# Patient Record
Sex: Female | Born: 1945
Health system: Southern US, Community
[De-identification: ages and names within clinical notes are randomized; demographics above are authoritative.]

## PROBLEM LIST (undated history)

## (undated) ENCOUNTER — Emergency Department (HOSPITAL_COMMUNITY): Discharge status: Against Medical Advice | Payer: BC Managed Care – PPO

## (undated) DIAGNOSIS — M542 Cervicalgia: Secondary | ICD-10-CM

## (undated) DIAGNOSIS — C801 Malignant (primary) neoplasm, unspecified: Secondary | ICD-10-CM

## (undated) DIAGNOSIS — N329 Bladder disorder, unspecified: Secondary | ICD-10-CM

## (undated) DIAGNOSIS — Z7901 Long term (current) use of anticoagulants: Secondary | ICD-10-CM

## (undated) DIAGNOSIS — Z8543 Personal history of malignant neoplasm of ovary: Secondary | ICD-10-CM

## (undated) DIAGNOSIS — R112 Nausea with vomiting, unspecified: Secondary | ICD-10-CM

## (undated) DIAGNOSIS — Z87442 Personal history of urinary calculi: Secondary | ICD-10-CM

## (undated) DIAGNOSIS — R319 Hematuria, unspecified: Secondary | ICD-10-CM

## (undated) DIAGNOSIS — R635 Abnormal weight gain: Secondary | ICD-10-CM

## (undated) DIAGNOSIS — Z8781 Personal history of (healed) traumatic fracture: Secondary | ICD-10-CM

## (undated) DIAGNOSIS — Z9889 Other specified postprocedural states: Secondary | ICD-10-CM

## (undated) DIAGNOSIS — R51 Headache: Secondary | ICD-10-CM

## (undated) DIAGNOSIS — S6390XA Sprain of unspecified part of unspecified wrist and hand, initial encounter: Secondary | ICD-10-CM

## (undated) DIAGNOSIS — Z86718 Personal history of other venous thrombosis and embolism: Secondary | ICD-10-CM

## (undated) DIAGNOSIS — J069 Acute upper respiratory infection, unspecified: Secondary | ICD-10-CM

## (undated) DIAGNOSIS — M1990 Unspecified osteoarthritis, unspecified site: Secondary | ICD-10-CM

## (undated) HISTORY — PX: CATARACT EXTRACTION W/ INTRAOCULAR LENS  IMPLANT, BILATERAL: SHX1307

## (undated) HISTORY — PX: BLEPHAROPLASTY: SUR158

## (undated) HISTORY — DX: Personal history of (healed) traumatic fracture: Z87.81

## (undated) HISTORY — DX: Cervicalgia: M54.2

## (undated) HISTORY — PX: ABDOMINAL HYSTERECTOMY: SHX81

## (undated) HISTORY — PX: APPENDECTOMY: SHX54

## (undated) HISTORY — DX: Malignant (primary) neoplasm, unspecified: C80.1

## (undated) HISTORY — PX: LAPAROSCOPIC CHOLECYSTECTOMY: SUR755

## (undated) HISTORY — DX: Sprain of unspecified part of unspecified wrist and hand, initial encounter: S63.90XA

## (undated) HISTORY — DX: Headache: R51

## (undated) HISTORY — PX: VENTRAL HERNIA REPAIR: SHX424

## (undated) HISTORY — PX: TRANSURETHRAL RESECTION OF BLADDER: SUR1395

## (undated) HISTORY — DX: Acute upper respiratory infection, unspecified: J06.9

## (undated) HISTORY — DX: Personal history of malignant neoplasm of ovary: Z85.43

## (undated) HISTORY — DX: Other cardiomyopathies: I42.8

## (undated) HISTORY — PX: INGUINAL HERNIA REPAIR: SUR1180

## (undated) HISTORY — DX: Abnormal weight gain: R63.5

---

## 1977-03-28 HISTORY — PX: GASTRIC BYPASS: SHX52

## 1999-04-12 ENCOUNTER — Other Ambulatory Visit: Admission: RE | Admit: 1999-04-12 | Discharge: 1999-04-12 | Payer: Self-pay | Admitting: *Deleted

## 2000-04-17 ENCOUNTER — Encounter: Admission: RE | Admit: 2000-04-17 | Discharge: 2000-04-17 | Payer: Self-pay | Admitting: *Deleted

## 2000-04-17 ENCOUNTER — Encounter: Payer: Self-pay | Admitting: *Deleted

## 2000-04-17 ENCOUNTER — Other Ambulatory Visit: Admission: RE | Admit: 2000-04-17 | Discharge: 2000-04-17 | Payer: Self-pay | Admitting: *Deleted

## 2002-03-26 ENCOUNTER — Encounter: Payer: Self-pay | Admitting: *Deleted

## 2002-03-26 ENCOUNTER — Encounter: Admission: RE | Admit: 2002-03-26 | Discharge: 2002-03-26 | Payer: Self-pay | Admitting: *Deleted

## 2002-05-14 ENCOUNTER — Other Ambulatory Visit: Admission: RE | Admit: 2002-05-14 | Discharge: 2002-05-14 | Payer: Self-pay | Admitting: Obstetrics & Gynecology

## 2002-07-04 ENCOUNTER — Ambulatory Visit (HOSPITAL_BASED_OUTPATIENT_CLINIC_OR_DEPARTMENT_OTHER): Admission: RE | Admit: 2002-07-04 | Discharge: 2002-07-04 | Payer: Self-pay | Admitting: Otolaryngology

## 2003-02-03 ENCOUNTER — Encounter: Admission: RE | Admit: 2003-02-03 | Discharge: 2003-05-04 | Payer: Self-pay

## 2003-04-14 ENCOUNTER — Encounter: Admission: RE | Admit: 2003-04-14 | Discharge: 2003-04-14 | Payer: Self-pay | Admitting: *Deleted

## 2004-07-12 ENCOUNTER — Encounter: Admission: RE | Admit: 2004-07-12 | Discharge: 2004-07-12 | Payer: Self-pay | Admitting: Obstetrics & Gynecology

## 2005-02-18 ENCOUNTER — Encounter: Admission: RE | Admit: 2005-02-18 | Discharge: 2005-02-18 | Payer: Self-pay | Admitting: General Surgery

## 2005-03-28 DIAGNOSIS — Z87440 Personal history of urinary (tract) infections: Secondary | ICD-10-CM

## 2005-03-28 DIAGNOSIS — C801 Malignant (primary) neoplasm, unspecified: Secondary | ICD-10-CM

## 2005-03-28 HISTORY — DX: Personal history of urinary (tract) infections: Z87.440

## 2005-03-28 HISTORY — PX: TRANSURETHRAL RESECTION OF BLADDER: SUR1395

## 2005-03-28 HISTORY — DX: Malignant (primary) neoplasm, unspecified: C80.1

## 2005-03-28 HISTORY — PX: OTHER SURGICAL HISTORY: SHX169

## 2005-03-28 HISTORY — PX: SPLENECTOMY: SUR1306

## 2005-04-04 ENCOUNTER — Encounter (INDEPENDENT_AMBULATORY_CARE_PROVIDER_SITE_OTHER): Payer: Self-pay | Admitting: *Deleted

## 2005-04-05 ENCOUNTER — Inpatient Hospital Stay (HOSPITAL_COMMUNITY): Admission: RE | Admit: 2005-04-05 | Discharge: 2005-04-06 | Payer: Self-pay | Admitting: General Surgery

## 2005-04-15 ENCOUNTER — Ambulatory Visit (HOSPITAL_COMMUNITY): Admission: RE | Admit: 2005-04-15 | Discharge: 2005-04-15 | Payer: Self-pay | Admitting: General Surgery

## 2005-04-26 ENCOUNTER — Ambulatory Visit: Admission: RE | Admit: 2005-04-26 | Discharge: 2005-04-26 | Payer: Self-pay | Admitting: Gynecology

## 2005-04-27 ENCOUNTER — Ambulatory Visit: Payer: Self-pay | Admitting: Oncology

## 2005-05-03 ENCOUNTER — Encounter (INDEPENDENT_AMBULATORY_CARE_PROVIDER_SITE_OTHER): Payer: Self-pay | Admitting: Specialist

## 2005-05-03 ENCOUNTER — Inpatient Hospital Stay (HOSPITAL_COMMUNITY): Admission: RE | Admit: 2005-05-03 | Discharge: 2005-05-07 | Payer: Self-pay | Admitting: Gynecologic Oncology

## 2005-05-03 DIAGNOSIS — Z9081 Acquired absence of spleen: Secondary | ICD-10-CM

## 2005-05-03 HISTORY — DX: Acquired absence of spleen: Z90.81

## 2005-05-03 HISTORY — PX: OTHER SURGICAL HISTORY: SHX169

## 2005-05-18 ENCOUNTER — Ambulatory Visit: Admission: RE | Admit: 2005-05-18 | Discharge: 2005-05-18 | Payer: Self-pay | Admitting: Gynecologic Oncology

## 2005-06-06 ENCOUNTER — Ambulatory Visit (HOSPITAL_BASED_OUTPATIENT_CLINIC_OR_DEPARTMENT_OTHER): Admission: RE | Admit: 2005-06-06 | Discharge: 2005-06-06 | Payer: Self-pay | Admitting: General Surgery

## 2005-06-16 ENCOUNTER — Ambulatory Visit: Payer: Self-pay | Admitting: Oncology

## 2005-06-24 ENCOUNTER — Ambulatory Visit (HOSPITAL_COMMUNITY): Admission: RE | Admit: 2005-06-24 | Discharge: 2005-06-24 | Payer: Self-pay | Admitting: Oncology

## 2005-06-30 LAB — COMPREHENSIVE METABOLIC PANEL
ALT: 13 U/L (ref 0–40)
AST: 17 U/L (ref 0–37)
Albumin: 3.5 g/dL (ref 3.5–5.2)
Alkaline Phosphatase: 84 U/L (ref 39–117)
BUN: 20 mg/dL (ref 6–23)
CO2: 26 mEq/L (ref 19–32)
Calcium: 8.5 mg/dL (ref 8.4–10.5)
Chloride: 109 mEq/L (ref 96–112)
Creatinine, Ser: 0.8 mg/dL (ref 0.4–1.2)
Glucose, Bld: 93 mg/dL (ref 70–99)
Potassium: 3.3 mEq/L — ABNORMAL LOW (ref 3.5–5.3)
Sodium: 145 mEq/L (ref 135–145)
Total Bilirubin: 0.3 mg/dL (ref 0.3–1.2)
Total Protein: 5.9 g/dL — ABNORMAL LOW (ref 6.0–8.3)

## 2005-06-30 LAB — CBC WITH DIFFERENTIAL/PLATELET
BASO%: 0.4 % (ref 0.0–2.0)
Basophils Absolute: 0 10*3/uL (ref 0.0–0.1)
EOS%: 0.8 % (ref 0.0–7.0)
Eosinophils Absolute: 0.1 10*3/uL (ref 0.0–0.5)
HCT: 33.7 % — ABNORMAL LOW (ref 34.8–46.6)
HGB: 11.1 g/dL — ABNORMAL LOW (ref 11.6–15.9)
LYMPH%: 20.8 % (ref 14.0–48.0)
MCH: 30.2 pg (ref 26.0–34.0)
MCHC: 32.9 g/dL (ref 32.0–36.0)
MCV: 91.9 fL (ref 81.0–101.0)
MONO#: 1 10*3/uL — ABNORMAL HIGH (ref 0.1–0.9)
MONO%: 10.6 % (ref 0.0–13.0)
NEUT#: 6.1 10*3/uL (ref 1.5–6.5)
NEUT%: 67.4 % (ref 39.6–76.8)
Platelets: 301 10*3/uL (ref 145–400)
RBC: 3.67 10*6/uL — ABNORMAL LOW (ref 3.70–5.32)
RDW: 14.8 % — ABNORMAL HIGH (ref 11.3–14.5)
WBC: 9.1 10*3/uL (ref 3.9–10.0)
lymph#: 1.9 10*3/uL (ref 0.9–3.3)

## 2005-06-30 LAB — CA 125: CA 125: 8.7 U/mL (ref 0.0–30.2)

## 2005-07-08 LAB — CBC WITH DIFFERENTIAL/PLATELET
BASO%: 0.4 % (ref 0.0–2.0)
Basophils Absolute: 0 10*3/uL (ref 0.0–0.1)
EOS%: 1 % (ref 0.0–7.0)
Eosinophils Absolute: 0 10*3/uL (ref 0.0–0.5)
HCT: 33.6 % — ABNORMAL LOW (ref 34.8–46.6)
HGB: 11.1 g/dL — ABNORMAL LOW (ref 11.6–15.9)
LYMPH%: 19.2 % (ref 14.0–48.0)
MCH: 30.7 pg (ref 26.0–34.0)
MCHC: 32.9 g/dL (ref 32.0–36.0)
MCV: 93.4 fL (ref 81.0–101.0)
MONO#: 0.2 10*3/uL (ref 0.1–0.9)
MONO%: 3.7 % (ref 0.0–13.0)
NEUT#: 3.6 10*3/uL (ref 1.5–6.5)
NEUT%: 75.7 % (ref 39.6–76.8)
Platelets: 242 10*3/uL (ref 145–400)
RBC: 3.6 10*6/uL — ABNORMAL LOW (ref 3.70–5.32)
RDW: 14.3 % (ref 11.3–14.5)
WBC: 4.7 10*3/uL (ref 3.9–10.0)
lymph#: 0.9 10*3/uL (ref 0.9–3.3)

## 2005-07-22 LAB — CBC WITH DIFFERENTIAL/PLATELET
BASO%: 0.9 % (ref 0.0–2.0)
Basophils Absolute: 0.1 10*3/uL (ref 0.0–0.1)
EOS%: 0.5 % (ref 0.0–7.0)
Eosinophils Absolute: 0 10*3/uL (ref 0.0–0.5)
HCT: 34.2 % — ABNORMAL LOW (ref 34.8–46.6)
HGB: 11.2 g/dL — ABNORMAL LOW (ref 11.6–15.9)
LYMPH%: 25.8 % (ref 14.0–48.0)
MCH: 30.4 pg (ref 26.0–34.0)
MCHC: 32.7 g/dL (ref 32.0–36.0)
MCV: 93.1 fL (ref 81.0–101.0)
MONO#: 0.9 10*3/uL (ref 0.1–0.9)
MONO%: 14.5 % — ABNORMAL HIGH (ref 0.0–13.0)
NEUT#: 3.5 10*3/uL (ref 1.5–6.5)
NEUT%: 58.4 % (ref 39.6–76.8)
Platelets: 383 10*3/uL (ref 145–400)
RBC: 3.68 10*6/uL — ABNORMAL LOW (ref 3.70–5.32)
RDW: 16.7 % — ABNORMAL HIGH (ref 11.3–14.5)
WBC: 6 10*3/uL (ref 3.9–10.0)
lymph#: 1.5 10*3/uL (ref 0.9–3.3)

## 2005-07-22 LAB — COMPREHENSIVE METABOLIC PANEL
ALT: 19 U/L (ref 0–40)
AST: 23 U/L (ref 0–37)
Albumin: 3.5 g/dL (ref 3.5–5.2)
Alkaline Phosphatase: 82 U/L (ref 39–117)
BUN: 21 mg/dL (ref 6–23)
CO2: 19 mEq/L (ref 19–32)
Calcium: 8.6 mg/dL (ref 8.4–10.5)
Chloride: 110 mEq/L (ref 96–112)
Creatinine, Ser: 0.8 mg/dL (ref 0.4–1.2)
Glucose, Bld: 78 mg/dL (ref 70–99)
Potassium: 3.8 mEq/L (ref 3.5–5.3)
Sodium: 139 mEq/L (ref 135–145)
Total Bilirubin: 0.4 mg/dL (ref 0.3–1.2)
Total Protein: 6.3 g/dL (ref 6.0–8.3)

## 2005-07-22 LAB — CA 125: CA 125: 6 U/mL (ref 0.0–30.2)

## 2005-07-29 LAB — CBC WITH DIFFERENTIAL/PLATELET
BASO%: 0.5 % (ref 0.0–2.0)
Basophils Absolute: 0 10*3/uL (ref 0.0–0.1)
EOS%: 1.2 % (ref 0.0–7.0)
Eosinophils Absolute: 0 10*3/uL (ref 0.0–0.5)
HCT: 33.1 % — ABNORMAL LOW (ref 34.8–46.6)
HGB: 10.9 g/dL — ABNORMAL LOW (ref 11.6–15.9)
LYMPH%: 32.6 % (ref 14.0–48.0)
MCH: 31.2 pg (ref 26.0–34.0)
MCHC: 32.9 g/dL (ref 32.0–36.0)
MCV: 94.8 fL (ref 81.0–101.0)
MONO#: 0.2 10*3/uL (ref 0.1–0.9)
MONO%: 6.4 % (ref 0.0–13.0)
NEUT#: 2.2 10*3/uL (ref 1.5–6.5)
NEUT%: 59.4 % (ref 39.6–76.8)
Platelets: 270 10*3/uL (ref 145–400)
RBC: 3.49 10*6/uL — ABNORMAL LOW (ref 3.70–5.32)
RDW: 16.8 % — ABNORMAL HIGH (ref 11.3–14.5)
WBC: 3.7 10*3/uL — ABNORMAL LOW (ref 3.9–10.0)
lymph#: 1.2 10*3/uL (ref 0.9–3.3)

## 2005-08-10 ENCOUNTER — Ambulatory Visit: Payer: Self-pay | Admitting: Oncology

## 2005-08-12 LAB — CBC WITH DIFFERENTIAL/PLATELET
BASO%: 1.1 % (ref 0.0–2.0)
Basophils Absolute: 0.1 10*3/uL (ref 0.0–0.1)
EOS%: 0.2 % (ref 0.0–7.0)
Eosinophils Absolute: 0 10*3/uL (ref 0.0–0.5)
HCT: 35.2 % (ref 34.8–46.6)
HGB: 11.7 g/dL (ref 11.6–15.9)
LYMPH%: 17.3 % (ref 14.0–48.0)
MCH: 31.6 pg (ref 26.0–34.0)
MCHC: 33.3 g/dL (ref 32.0–36.0)
MCV: 95.1 fL (ref 81.0–101.0)
MONO#: 1.1 10*3/uL — ABNORMAL HIGH (ref 0.1–0.9)
MONO%: 11.5 % (ref 0.0–13.0)
NEUT#: 6.7 10*3/uL — ABNORMAL HIGH (ref 1.5–6.5)
NEUT%: 69.9 % (ref 39.6–76.8)
Platelets: 271 10*3/uL (ref 145–400)
RBC: 3.7 10*6/uL (ref 3.70–5.32)
RDW: 18.1 % — ABNORMAL HIGH (ref 11.3–14.5)
WBC: 9.6 10*3/uL (ref 3.9–10.0)
lymph#: 1.7 10*3/uL (ref 0.9–3.3)

## 2005-08-12 LAB — COMPREHENSIVE METABOLIC PANEL
ALT: 21 U/L (ref 0–40)
AST: 20 U/L (ref 0–37)
Albumin: 3.3 g/dL — ABNORMAL LOW (ref 3.5–5.2)
Alkaline Phosphatase: 86 U/L (ref 39–117)
BUN: 17 mg/dL (ref 6–23)
CO2: 21 mEq/L (ref 19–32)
Calcium: 7.2 mg/dL — ABNORMAL LOW (ref 8.4–10.5)
Chloride: 110 mEq/L (ref 96–112)
Creatinine, Ser: 0.7 mg/dL (ref 0.4–1.2)
Glucose, Bld: 88 mg/dL (ref 70–99)
Potassium: 3.5 mEq/L (ref 3.5–5.3)
Sodium: 141 mEq/L (ref 135–145)
Total Bilirubin: 0.3 mg/dL (ref 0.3–1.2)
Total Protein: 5.6 g/dL — ABNORMAL LOW (ref 6.0–8.3)

## 2005-08-12 LAB — CA 125: CA 125: 5.1 U/mL (ref 0.0–30.2)

## 2005-08-16 ENCOUNTER — Ambulatory Visit: Admission: RE | Admit: 2005-08-16 | Discharge: 2005-08-16 | Payer: Self-pay | Admitting: Gynecology

## 2005-08-24 LAB — CBC WITH DIFFERENTIAL/PLATELET
BASO%: 1.3 % (ref 0.0–2.0)
Basophils Absolute: 0 10*3/uL (ref 0.0–0.1)
EOS%: 1.3 % (ref 0.0–7.0)
Eosinophils Absolute: 0 10*3/uL (ref 0.0–0.5)
HCT: 29.7 % — ABNORMAL LOW (ref 34.8–46.6)
HGB: 9.6 g/dL — ABNORMAL LOW (ref 11.6–15.9)
LYMPH%: 42.8 % (ref 14.0–48.0)
MCH: 31.4 pg (ref 26.0–34.0)
MCHC: 32.2 g/dL (ref 32.0–36.0)
MCV: 97.8 fL (ref 81.0–101.0)
MONO#: 0.9 10*3/uL (ref 0.1–0.9)
MONO%: 25.6 % — ABNORMAL HIGH (ref 0.0–13.0)
NEUT#: 1.1 10*3/uL — ABNORMAL LOW (ref 1.5–6.5)
NEUT%: 29 % — ABNORMAL LOW (ref 39.6–76.8)
Platelets: 233 10*3/uL (ref 145–400)
RBC: 3.04 10*6/uL — ABNORMAL LOW (ref 3.70–5.32)
RDW: 18.2 % — ABNORMAL HIGH (ref 11.3–14.5)
WBC: 3.6 10*3/uL — ABNORMAL LOW (ref 3.9–10.0)
lymph#: 1.6 10*3/uL (ref 0.9–3.3)

## 2005-08-24 LAB — TECHNOLOGIST REVIEW

## 2005-08-26 LAB — CBC WITH DIFFERENTIAL/PLATELET
BASO%: 0.9 % (ref 0.0–2.0)
Basophils Absolute: 0 10*3/uL (ref 0.0–0.1)
EOS%: 1.6 % (ref 0.0–7.0)
Eosinophils Absolute: 0.1 10*3/uL (ref 0.0–0.5)
HCT: 30.8 % — ABNORMAL LOW (ref 34.8–46.6)
HGB: 9.7 g/dL — ABNORMAL LOW (ref 11.6–15.9)
LYMPH%: 50 % — ABNORMAL HIGH (ref 14.0–48.0)
MCH: 31.3 pg (ref 26.0–34.0)
MCHC: 31.6 g/dL — ABNORMAL LOW (ref 32.0–36.0)
MCV: 99 fL (ref 81.0–101.0)
MONO#: 1.2 10*3/uL — ABNORMAL HIGH (ref 0.1–0.9)
MONO%: 31.9 % — ABNORMAL HIGH (ref 0.0–13.0)
NEUT#: 0.6 10*3/uL — ABNORMAL LOW (ref 1.5–6.5)
NEUT%: 15.6 % — ABNORMAL LOW (ref 39.6–76.8)
Platelets: 225 10*3/uL (ref 145–400)
RBC: 3.11 10*6/uL — ABNORMAL LOW (ref 3.70–5.32)
RDW: 19.6 % — ABNORMAL HIGH (ref 11.3–14.5)
WBC: 3.7 10*3/uL — ABNORMAL LOW (ref 3.9–10.0)
lymph#: 1.8 10*3/uL (ref 0.9–3.3)

## 2005-08-26 LAB — URINALYSIS, MICROSCOPIC - CHCC
Bilirubin (Urine): NEGATIVE
Glucose: NEGATIVE g/dL
Ketones: NEGATIVE mg/dL
Nitrite: NEGATIVE
Protein: 100 mg/dL
Specific Gravity, Urine: 1.025 (ref 1.003–1.035)
pH: 6 (ref 4.6–8.0)

## 2005-08-28 LAB — URINE CULTURE

## 2005-09-02 LAB — URINALYSIS, MICROSCOPIC - CHCC
Bilirubin (Urine): NEGATIVE
Glucose: NEGATIVE g/dL
Ketones: NEGATIVE mg/dL
Nitrite: NEGATIVE
Protein: 30 mg/dL
Specific Gravity, Urine: 1.03 (ref 1.003–1.035)
pH: 6 (ref 4.6–8.0)

## 2005-09-02 LAB — CBC WITH DIFFERENTIAL/PLATELET
BASO%: 0.4 % (ref 0.0–2.0)
Basophils Absolute: 0 10*3/uL (ref 0.0–0.1)
EOS%: 0.3 % (ref 0.0–7.0)
Eosinophils Absolute: 0 10*3/uL (ref 0.0–0.5)
HCT: 30.6 % — ABNORMAL LOW (ref 34.8–46.6)
HGB: 10.1 g/dL — ABNORMAL LOW (ref 11.6–15.9)
LYMPH%: 23.2 % (ref 14.0–48.0)
MCH: 32.5 pg (ref 26.0–34.0)
MCHC: 32.9 g/dL (ref 32.0–36.0)
MCV: 99 fL (ref 81.0–101.0)
MONO#: 0.8 10*3/uL (ref 0.1–0.9)
MONO%: 15 % — ABNORMAL HIGH (ref 0.0–13.0)
NEUT#: 3.3 10*3/uL (ref 1.5–6.5)
NEUT%: 61.1 % (ref 39.6–76.8)
Platelets: 247 10*3/uL (ref 145–400)
RBC: 3.09 10*6/uL — ABNORMAL LOW (ref 3.70–5.32)
RDW: 24.1 % — ABNORMAL HIGH (ref 11.3–14.5)
WBC: 5.4 10*3/uL (ref 3.9–10.0)
lymph#: 1.3 10*3/uL (ref 0.9–3.3)

## 2005-09-03 LAB — COMPREHENSIVE METABOLIC PANEL
ALT: 31 U/L (ref 0–40)
AST: 26 U/L (ref 0–37)
Albumin: 3.7 g/dL (ref 3.5–5.2)
Alkaline Phosphatase: 112 U/L (ref 39–117)
BUN: 17 mg/dL (ref 6–23)
CO2: 26 mEq/L (ref 19–32)
Calcium: 8.8 mg/dL (ref 8.4–10.5)
Chloride: 105 mEq/L (ref 96–112)
Creatinine, Ser: 0.92 mg/dL (ref 0.40–1.20)
Glucose, Bld: 72 mg/dL (ref 70–99)
Potassium: 4.2 mEq/L (ref 3.5–5.3)
Sodium: 142 mEq/L (ref 135–145)
Total Bilirubin: 0.4 mg/dL (ref 0.3–1.2)
Total Protein: 6.2 g/dL (ref 6.0–8.3)

## 2005-09-03 LAB — CA 125: CA 125: 5.6 U/mL (ref 0.0–30.2)

## 2005-09-09 LAB — CBC WITH DIFFERENTIAL/PLATELET
BASO%: 0.4 % (ref 0.0–2.0)
Basophils Absolute: 0 10*3/uL (ref 0.0–0.1)
EOS%: 0.4 % (ref 0.0–7.0)
Eosinophils Absolute: 0 10*3/uL (ref 0.0–0.5)
HCT: 27.5 % — ABNORMAL LOW (ref 34.8–46.6)
HGB: 9.4 g/dL — ABNORMAL LOW (ref 11.6–15.9)
LYMPH%: 21.6 % (ref 14.0–48.0)
MCH: 33.6 pg (ref 26.0–34.0)
MCHC: 34.2 g/dL (ref 32.0–36.0)
MCV: 98.2 fL (ref 81.0–101.0)
MONO#: 0.1 10*3/uL (ref 0.1–0.9)
MONO%: 3.6 % (ref 0.0–13.0)
NEUT#: 2.1 10*3/uL (ref 1.5–6.5)
NEUT%: 74 % (ref 39.6–76.8)
Platelets: 146 10*3/uL (ref 145–400)
RBC: 2.8 10*6/uL — ABNORMAL LOW (ref 3.70–5.32)
RDW: 22.3 % — ABNORMAL HIGH (ref 11.3–14.5)
WBC: 2.9 10*3/uL — ABNORMAL LOW (ref 3.9–10.0)
lymph#: 0.6 10*3/uL — ABNORMAL LOW (ref 0.9–3.3)

## 2005-09-13 ENCOUNTER — Ambulatory Visit (HOSPITAL_BASED_OUTPATIENT_CLINIC_OR_DEPARTMENT_OTHER): Admission: RE | Admit: 2005-09-13 | Discharge: 2005-09-13 | Payer: Self-pay | Admitting: Urology

## 2005-09-13 ENCOUNTER — Encounter (INDEPENDENT_AMBULATORY_CARE_PROVIDER_SITE_OTHER): Payer: Self-pay | Admitting: Specialist

## 2005-09-21 ENCOUNTER — Ambulatory Visit: Payer: Self-pay | Admitting: Oncology

## 2005-09-30 LAB — CBC WITH DIFFERENTIAL/PLATELET
BASO%: 0.4 % (ref 0.0–2.0)
Basophils Absolute: 0 10*3/uL (ref 0.0–0.1)
EOS%: 0.7 % (ref 0.0–7.0)
Eosinophils Absolute: 0 10*3/uL (ref 0.0–0.5)
HCT: 30.4 % — ABNORMAL LOW (ref 34.8–46.6)
HGB: 10 g/dL — ABNORMAL LOW (ref 11.6–15.9)
LYMPH%: 20.9 % (ref 14.0–48.0)
MCH: 35.4 pg — ABNORMAL HIGH (ref 26.0–34.0)
MCHC: 33 g/dL (ref 32.0–36.0)
MCV: 107.2 fL — ABNORMAL HIGH (ref 81.0–101.0)
MONO#: 0.6 10*3/uL (ref 0.1–0.9)
MONO%: 12 % (ref 0.0–13.0)
NEUT#: 3.5 10*3/uL (ref 1.5–6.5)
NEUT%: 66 % (ref 39.6–76.8)
Platelets: 427 10*3/uL — ABNORMAL HIGH (ref 145–400)
RBC: 2.84 10*6/uL — ABNORMAL LOW (ref 3.70–5.32)
RDW: 21.1 % — ABNORMAL HIGH (ref 11.3–14.5)
WBC: 5.2 10*3/uL (ref 3.9–10.0)
lymph#: 1.1 10*3/uL (ref 0.9–3.3)

## 2005-09-30 LAB — COMPREHENSIVE METABOLIC PANEL
ALT: 17 U/L (ref 0–40)
AST: 19 U/L (ref 0–37)
Albumin: 4 g/dL (ref 3.5–5.2)
Alkaline Phosphatase: 96 U/L (ref 39–117)
BUN: 17 mg/dL (ref 6–23)
CO2: 22 mEq/L (ref 19–32)
Calcium: 8.7 mg/dL (ref 8.4–10.5)
Chloride: 108 mEq/L (ref 96–112)
Creatinine, Ser: 0.84 mg/dL (ref 0.40–1.20)
Glucose, Bld: 92 mg/dL (ref 70–99)
Potassium: 3.8 mEq/L (ref 3.5–5.3)
Sodium: 140 mEq/L (ref 135–145)
Total Bilirubin: 0.5 mg/dL (ref 0.3–1.2)
Total Protein: 6.8 g/dL (ref 6.0–8.3)

## 2005-09-30 LAB — CA 125: CA 125: 5.8 U/mL (ref 0.0–30.2)

## 2005-10-04 LAB — CBC WITH DIFFERENTIAL/PLATELET
BASO%: 0.3 % (ref 0.0–2.0)
Basophils Absolute: 0 10*3/uL (ref 0.0–0.1)
EOS%: 0.6 % (ref 0.0–7.0)
Eosinophils Absolute: 0 10*3/uL (ref 0.0–0.5)
HCT: 29.9 % — ABNORMAL LOW (ref 34.8–46.6)
HGB: 10.1 g/dL — ABNORMAL LOW (ref 11.6–15.9)
LYMPH%: 22.1 % (ref 14.0–48.0)
MCH: 36.3 pg — ABNORMAL HIGH (ref 26.0–34.0)
MCHC: 33.7 g/dL (ref 32.0–36.0)
MCV: 107.6 fL — ABNORMAL HIGH (ref 81.0–101.0)
MONO#: 0.1 10*3/uL (ref 0.1–0.9)
MONO%: 3.2 % (ref 0.0–13.0)
NEUT#: 3 10*3/uL (ref 1.5–6.5)
NEUT%: 73.8 % (ref 39.6–76.8)
Platelets: 391 10*3/uL (ref 145–400)
RBC: 2.78 10*6/uL — ABNORMAL LOW (ref 3.70–5.32)
RDW: 19 % — ABNORMAL HIGH (ref 11.3–14.5)
WBC: 4.1 10*3/uL (ref 3.9–10.0)
lymph#: 0.9 10*3/uL (ref 0.9–3.3)

## 2005-10-26 ENCOUNTER — Ambulatory Visit: Admission: RE | Admit: 2005-10-26 | Discharge: 2005-10-26 | Payer: Self-pay | Admitting: Oncology

## 2005-10-26 ENCOUNTER — Encounter: Payer: Self-pay | Admitting: Vascular Surgery

## 2005-10-28 ENCOUNTER — Ambulatory Visit (HOSPITAL_COMMUNITY): Admission: RE | Admit: 2005-10-28 | Discharge: 2005-10-28 | Payer: Self-pay | Admitting: Oncology

## 2005-10-28 LAB — CBC WITH DIFFERENTIAL/PLATELET
BASO%: 1.1 % (ref 0.0–2.0)
Basophils Absolute: 0.1 10*3/uL (ref 0.0–0.1)
EOS%: 0.6 % (ref 0.0–7.0)
Eosinophils Absolute: 0 10*3/uL (ref 0.0–0.5)
HCT: 29.1 % — ABNORMAL LOW (ref 34.8–46.6)
HGB: 9.7 g/dL — ABNORMAL LOW (ref 11.6–15.9)
LYMPH%: 27.7 % (ref 14.0–48.0)
MCH: 37.1 pg — ABNORMAL HIGH (ref 26.0–34.0)
MCHC: 33.2 g/dL (ref 32.0–36.0)
MCV: 112 fL — ABNORMAL HIGH (ref 81.0–101.0)
MONO#: 0.6 10*3/uL (ref 0.1–0.9)
MONO%: 11.8 % (ref 0.0–13.0)
NEUT#: 3.1 10*3/uL (ref 1.5–6.5)
NEUT%: 58.7 % (ref 39.6–76.8)
Platelets: ADEQUATE 10*3/uL (ref 145–400)
RBC: 2.6 10*6/uL — ABNORMAL LOW (ref 3.70–5.32)
RDW: 15.6 % — ABNORMAL HIGH (ref 11.3–14.5)
WBC: 5.2 10*3/uL (ref 3.9–10.0)
lymph#: 1.4 10*3/uL (ref 0.9–3.3)

## 2005-10-28 LAB — PROTIME-INR
INR: 1 — ABNORMAL LOW (ref 2.00–3.50)
Protime: 12.2 Seconds (ref 10.6–13.4)

## 2005-10-31 LAB — CBC WITH DIFFERENTIAL/PLATELET
BASO%: 0.3 % (ref 0.0–2.0)
Basophils Absolute: 0 10*3/uL (ref 0.0–0.1)
EOS%: 0.9 % (ref 0.0–7.0)
Eosinophils Absolute: 0 10*3/uL (ref 0.0–0.5)
HCT: 28.6 % — ABNORMAL LOW (ref 34.8–46.6)
HGB: 9.5 g/dL — ABNORMAL LOW (ref 11.6–15.9)
LYMPH%: 27.3 % (ref 14.0–48.0)
MCH: 37 pg — ABNORMAL HIGH (ref 26.0–34.0)
MCHC: 33.4 g/dL (ref 32.0–36.0)
MCV: 110.8 fL — ABNORMAL HIGH (ref 81.0–101.0)
MONO#: 0.7 10*3/uL (ref 0.1–0.9)
MONO%: 13.6 % — ABNORMAL HIGH (ref 0.0–13.0)
NEUT#: 2.8 10*3/uL (ref 1.5–6.5)
NEUT%: 57.9 % (ref 39.6–76.8)
Platelets: 381 10*3/uL (ref 145–400)
RBC: 2.58 10*6/uL — ABNORMAL LOW (ref 3.70–5.32)
RDW: 18.1 % — ABNORMAL HIGH (ref 11.3–14.5)
WBC: 4.8 10*3/uL (ref 3.9–10.0)
lymph#: 1.3 10*3/uL (ref 0.9–3.3)

## 2005-10-31 LAB — COMPREHENSIVE METABOLIC PANEL
ALT: 20 U/L (ref 0–40)
AST: 20 U/L (ref 0–37)
Albumin: 3.8 g/dL (ref 3.5–5.2)
Alkaline Phosphatase: 100 U/L (ref 39–117)
BUN: 16 mg/dL (ref 6–23)
CO2: 27 mEq/L (ref 19–32)
Calcium: 8.6 mg/dL (ref 8.4–10.5)
Chloride: 108 mEq/L (ref 96–112)
Creatinine, Ser: 1.02 mg/dL (ref 0.40–1.20)
Glucose, Bld: 80 mg/dL (ref 70–99)
Potassium: 3.8 mEq/L (ref 3.5–5.3)
Sodium: 144 mEq/L (ref 135–145)
Total Bilirubin: 0.2 mg/dL — ABNORMAL LOW (ref 0.3–1.2)
Total Protein: 6.2 g/dL (ref 6.0–8.3)

## 2005-10-31 LAB — PROTIME-INR
INR: 1.3 — ABNORMAL LOW (ref 2.00–3.50)
Protime: 14.3 Seconds — ABNORMAL HIGH (ref 10.6–13.4)

## 2005-10-31 LAB — CA 125: CA 125: 5.7 U/mL (ref 0.0–30.2)

## 2005-11-01 ENCOUNTER — Ambulatory Visit: Admission: RE | Admit: 2005-11-01 | Discharge: 2005-11-01 | Payer: Self-pay | Admitting: Gynecology

## 2005-11-01 LAB — CBC WITH DIFFERENTIAL/PLATELET
BASO%: 1.3 % (ref 0.0–2.0)
Basophils Absolute: 0.1 10*3/uL (ref 0.0–0.1)
EOS%: 1 % (ref 0.0–7.0)
Eosinophils Absolute: 0.1 10*3/uL (ref 0.0–0.5)
HCT: 31.8 % — ABNORMAL LOW (ref 34.8–46.6)
HGB: 10.1 g/dL — ABNORMAL LOW (ref 11.6–15.9)
LYMPH%: 31.4 % (ref 14.0–48.0)
MCH: 36.3 pg — ABNORMAL HIGH (ref 26.0–34.0)
MCHC: 31.8 g/dL — ABNORMAL LOW (ref 32.0–36.0)
MCV: 114 fL — ABNORMAL HIGH (ref 81.0–101.0)
MONO#: 1 10*3/uL — ABNORMAL HIGH (ref 0.1–0.9)
MONO%: 18.8 % — ABNORMAL HIGH (ref 0.0–13.0)
NEUT#: 2.4 10*3/uL (ref 1.5–6.5)
NEUT%: 47.5 % (ref 39.6–76.8)
Platelets: 378 10*3/uL (ref 145–400)
RBC: 2.78 10*6/uL — ABNORMAL LOW (ref 3.70–5.32)
RDW: 15.4 % — ABNORMAL HIGH (ref 11.3–14.5)
WBC: 5.1 10*3/uL (ref 3.9–10.0)
lymph#: 1.6 10*3/uL (ref 0.9–3.3)

## 2005-11-01 LAB — PROTIME-INR
INR: 1.9 (ref 2.00–3.50)
Protime: 16.8 Seconds (ref 10.6–13.4)

## 2005-11-03 ENCOUNTER — Ambulatory Visit: Payer: Self-pay | Admitting: Oncology

## 2005-11-03 LAB — CBC WITH DIFFERENTIAL/PLATELET
BASO%: 1.5 % (ref 0.0–2.0)
Basophils Absolute: 0.1 10*3/uL (ref 0.0–0.1)
EOS%: 1.6 % (ref 0.0–7.0)
Eosinophils Absolute: 0.1 10*3/uL (ref 0.0–0.5)
HCT: 31.9 % — ABNORMAL LOW (ref 34.8–46.6)
HGB: 9.9 g/dL — ABNORMAL LOW (ref 11.6–15.9)
LYMPH%: 34.4 % (ref 14.0–48.0)
MCH: 35.4 pg — ABNORMAL HIGH (ref 26.0–34.0)
MCHC: 31.1 g/dL — ABNORMAL LOW (ref 32.0–36.0)
MCV: 114 fL — ABNORMAL HIGH (ref 81.0–101.0)
MONO#: 0.8 10*3/uL (ref 0.1–0.9)
MONO%: 18.3 % — ABNORMAL HIGH (ref 0.0–13.0)
NEUT#: 2 10*3/uL (ref 1.5–6.5)
NEUT%: 44.2 % (ref 39.6–76.8)
Platelets: 410 10*3/uL — ABNORMAL HIGH (ref 145–400)
RBC: 2.8 10*6/uL — ABNORMAL LOW (ref 3.70–5.32)
RDW: 15.4 % — ABNORMAL HIGH (ref 11.3–14.5)
WBC: 4.6 10*3/uL (ref 3.9–10.0)
lymph#: 1.6 10*3/uL (ref 0.9–3.3)

## 2005-11-03 LAB — PROTIME-INR
INR: 3.7 — ABNORMAL HIGH (ref 2.00–3.50)
Protime: 23.1 Seconds — ABNORMAL HIGH (ref 10.6–13.4)

## 2005-11-07 LAB — PROTIME-INR
INR: 2.8 (ref 2.00–3.50)
Protime: 20.1 Seconds — ABNORMAL HIGH (ref 10.6–13.4)

## 2005-11-15 ENCOUNTER — Encounter: Admission: RE | Admit: 2005-11-15 | Discharge: 2005-11-15 | Payer: Self-pay | Admitting: General Surgery

## 2005-11-16 LAB — PROTIME-INR
INR: 2.8 (ref 2.00–3.50)
Protime: 33.6 Seconds — ABNORMAL HIGH (ref 10.6–13.4)

## 2005-11-16 LAB — CBC WITH DIFFERENTIAL/PLATELET
BASO%: 1.7 % (ref 0.0–2.0)
Basophils Absolute: 0.1 10*3/uL (ref 0.0–0.1)
EOS%: 1.2 % (ref 0.0–7.0)
Eosinophils Absolute: 0.1 10*3/uL (ref 0.0–0.5)
HCT: 32.8 % — ABNORMAL LOW (ref 34.8–46.6)
HGB: 10.4 g/dL — ABNORMAL LOW (ref 11.6–15.9)
LYMPH%: 31.9 % (ref 14.0–48.0)
MCH: 35 pg — ABNORMAL HIGH (ref 26.0–34.0)
MCHC: 31.8 g/dL — ABNORMAL LOW (ref 32.0–36.0)
MCV: 110 fL — ABNORMAL HIGH (ref 81.0–101.0)
MONO#: 1 10*3/uL — ABNORMAL HIGH (ref 0.1–0.9)
MONO%: 16.8 % — ABNORMAL HIGH (ref 0.0–13.0)
NEUT#: 2.9 10*3/uL (ref 1.5–6.5)
NEUT%: 48.4 % (ref 39.6–76.8)
Platelets: 445 10*3/uL — ABNORMAL HIGH (ref 145–400)
RBC: 2.98 10*6/uL — ABNORMAL LOW (ref 3.70–5.32)
RDW: 14.1 % (ref 11.3–14.5)
WBC: 5.9 10*3/uL (ref 3.9–10.0)
lymph#: 1.9 10*3/uL (ref 0.9–3.3)

## 2005-12-14 LAB — CBC WITH DIFFERENTIAL/PLATELET
BASO%: 0.8 % (ref 0.0–2.0)
Basophils Absolute: 0.1 10*3/uL (ref 0.0–0.1)
EOS%: 1.3 % (ref 0.0–7.0)
Eosinophils Absolute: 0.1 10*3/uL (ref 0.0–0.5)
HCT: 33.4 % — ABNORMAL LOW (ref 34.8–46.6)
HGB: 10.9 g/dL — ABNORMAL LOW (ref 11.6–15.9)
LYMPH%: 27.3 % (ref 14.0–48.0)
MCH: 34.4 pg — ABNORMAL HIGH (ref 26.0–34.0)
MCHC: 32.7 g/dL (ref 32.0–36.0)
MCV: 105 fL — ABNORMAL HIGH (ref 81.0–101.0)
MONO#: 1 10*3/uL — ABNORMAL HIGH (ref 0.1–0.9)
MONO%: 13.8 % — ABNORMAL HIGH (ref 0.0–13.0)
NEUT#: 4 10*3/uL (ref 1.5–6.5)
NEUT%: 56.9 % (ref 39.6–76.8)
Platelets: 374 10*3/uL (ref 145–400)
RBC: 3.17 10*6/uL — ABNORMAL LOW (ref 3.70–5.32)
RDW: 13.6 % (ref 11.3–14.5)
WBC: 7.1 10*3/uL (ref 3.9–10.0)
lymph#: 1.9 10*3/uL (ref 0.9–3.3)

## 2005-12-14 LAB — PROTIME-INR
INR: 3.4 (ref 2.00–3.50)
Protime: 40.8 Seconds — ABNORMAL HIGH (ref 10.6–13.4)

## 2005-12-22 ENCOUNTER — Ambulatory Visit: Payer: Self-pay | Admitting: Oncology

## 2005-12-26 LAB — PROTIME-INR
INR: 1.2 — ABNORMAL LOW (ref 2.00–3.50)
Protime: 14.4 Seconds — ABNORMAL HIGH (ref 10.6–13.4)

## 2006-01-11 LAB — CBC WITH DIFFERENTIAL/PLATELET
BASO%: 0.5 % (ref 0.0–2.0)
Basophils Absolute: 0 10*3/uL (ref 0.0–0.1)
EOS%: 1.9 % (ref 0.0–7.0)
Eosinophils Absolute: 0.1 10*3/uL (ref 0.0–0.5)
HCT: 31.7 % — ABNORMAL LOW (ref 34.8–46.6)
HGB: 10.3 g/dL — ABNORMAL LOW (ref 11.6–15.9)
LYMPH%: 25.7 % (ref 14.0–48.0)
MCH: 32.7 pg (ref 26.0–34.0)
MCHC: 32.6 g/dL (ref 32.0–36.0)
MCV: 100.5 fL (ref 81.0–101.0)
MONO#: 0.8 10*3/uL (ref 0.1–0.9)
MONO%: 14.5 % — ABNORMAL HIGH (ref 0.0–13.0)
NEUT#: 3.2 10*3/uL (ref 1.5–6.5)
NEUT%: 57.4 % (ref 39.6–76.8)
Platelets: 346 10*3/uL (ref 145–400)
RBC: 3.16 10*6/uL — ABNORMAL LOW (ref 3.70–5.32)
RDW: 15.3 % — ABNORMAL HIGH (ref 11.3–14.5)
WBC: 5.5 10*3/uL (ref 3.9–10.0)
lymph#: 1.4 10*3/uL (ref 0.9–3.3)

## 2006-01-11 LAB — PROTIME-INR
INR: 3.2 (ref 2.00–3.50)
Protime: 38.4 Seconds — ABNORMAL HIGH (ref 10.6–13.4)

## 2006-01-25 ENCOUNTER — Ambulatory Visit: Payer: Self-pay | Admitting: Oncology

## 2006-02-01 LAB — CBC WITH DIFFERENTIAL/PLATELET
BASO%: 0.1 % (ref 0.0–2.0)
Basophils Absolute: 0 10*3/uL (ref 0.0–0.1)
EOS%: 1.2 % (ref 0.0–7.0)
Eosinophils Absolute: 0.1 10*3/uL (ref 0.0–0.5)
HCT: 35.9 % (ref 34.8–46.6)
HGB: 11.8 g/dL (ref 11.6–15.9)
LYMPH%: 27.4 % (ref 14.0–48.0)
MCH: 32.4 pg (ref 26.0–34.0)
MCHC: 32.8 g/dL (ref 32.0–36.0)
MCV: 98.6 fL (ref 81.0–101.0)
MONO#: 0.9 10*3/uL (ref 0.1–0.9)
MONO%: 13.7 % — ABNORMAL HIGH (ref 0.0–13.0)
NEUT#: 3.7 10*3/uL (ref 1.5–6.5)
NEUT%: 57.6 % (ref 39.6–76.8)
Platelets: 277 10*3/uL (ref 145–400)
RBC: 3.64 10*6/uL — ABNORMAL LOW (ref 3.70–5.32)
RDW: 15.5 % — ABNORMAL HIGH (ref 11.3–14.5)
WBC: 6.4 10*3/uL (ref 3.9–10.0)
lymph#: 1.7 10*3/uL (ref 0.9–3.3)

## 2006-02-01 LAB — COMPREHENSIVE METABOLIC PANEL
ALT: 23 U/L (ref 0–35)
AST: 24 U/L (ref 0–37)
Albumin: 4 g/dL (ref 3.5–5.2)
Alkaline Phosphatase: 99 U/L (ref 39–117)
BUN: 17 mg/dL (ref 6–23)
CO2: 28 mEq/L (ref 19–32)
Calcium: 8.3 mg/dL — ABNORMAL LOW (ref 8.4–10.5)
Chloride: 105 mEq/L (ref 96–112)
Creatinine, Ser: 1.05 mg/dL (ref 0.40–1.20)
Glucose, Bld: 91 mg/dL (ref 70–99)
Potassium: 3.7 mEq/L (ref 3.5–5.3)
Sodium: 140 mEq/L (ref 135–145)
Total Bilirubin: 0.3 mg/dL (ref 0.3–1.2)
Total Protein: 6.5 g/dL (ref 6.0–8.3)

## 2006-02-01 LAB — PROTIME-INR
INR: 2 (ref 2.00–3.50)
Protime: 24 Seconds — ABNORMAL HIGH (ref 10.6–13.4)

## 2006-02-01 LAB — CA 125: CA 125: 6.4 U/mL (ref 0.0–30.2)

## 2006-02-20 ENCOUNTER — Ambulatory Visit: Payer: Self-pay | Admitting: Oncology

## 2006-02-23 LAB — PROTIME-INR
INR: 1.7 — ABNORMAL LOW (ref 2.00–3.50)
Protime: 20.4 Seconds — ABNORMAL HIGH (ref 10.6–13.4)

## 2006-03-07 LAB — COMPREHENSIVE METABOLIC PANEL
ALT: 25 U/L (ref 0–35)
AST: 22 U/L (ref 0–37)
Albumin: 3.8 g/dL (ref 3.5–5.2)
Alkaline Phosphatase: 87 U/L (ref 39–117)
BUN: 15 mg/dL (ref 6–23)
CO2: 26 mEq/L (ref 19–32)
Calcium: 8.9 mg/dL (ref 8.4–10.5)
Chloride: 106 mEq/L (ref 96–112)
Creatinine, Ser: 0.86 mg/dL (ref 0.40–1.20)
Glucose, Bld: 98 mg/dL (ref 70–99)
Potassium: 3.7 mEq/L (ref 3.5–5.3)
Sodium: 140 mEq/L (ref 135–145)
Total Bilirubin: 0.8 mg/dL (ref 0.3–1.2)
Total Protein: 6.1 g/dL (ref 6.0–8.3)

## 2006-03-07 LAB — CBC WITH DIFFERENTIAL/PLATELET
BASO%: 0.3 % (ref 0.0–2.0)
Basophils Absolute: 0 10*3/uL (ref 0.0–0.1)
EOS%: 1.7 % (ref 0.0–7.0)
Eosinophils Absolute: 0.1 10*3/uL (ref 0.0–0.5)
HCT: 34.9 % (ref 34.8–46.6)
HGB: 11.6 g/dL (ref 11.6–15.9)
LYMPH%: 31.4 % (ref 14.0–48.0)
MCH: 33.1 pg (ref 26.0–34.0)
MCHC: 33.1 g/dL (ref 32.0–36.0)
MCV: 100 fL (ref 81.0–101.0)
MONO#: 0.6 10*3/uL (ref 0.1–0.9)
MONO%: 12.6 % (ref 0.0–13.0)
NEUT#: 2.6 10*3/uL (ref 1.5–6.5)
NEUT%: 54 % (ref 39.6–76.8)
Platelets: 328 10*3/uL (ref 145–400)
RBC: 3.49 10*6/uL — ABNORMAL LOW (ref 3.70–5.32)
RDW: 15.1 % — ABNORMAL HIGH (ref 11.3–14.5)
WBC: 4.7 10*3/uL (ref 3.9–10.0)
lymph#: 1.5 10*3/uL (ref 0.9–3.3)

## 2006-03-07 LAB — PROTHROMBIN TIME
INR: 2 — ABNORMAL HIGH (ref 0.0–1.5)
Prothrombin Time: 23.2 seconds — ABNORMAL HIGH (ref 11.6–15.2)

## 2006-03-07 LAB — CA 125: CA 125: 3.2 U/mL (ref 0.0–30.2)

## 2006-03-08 ENCOUNTER — Ambulatory Visit: Admission: RE | Admit: 2006-03-08 | Discharge: 2006-03-08 | Payer: Self-pay | Admitting: Gynecology

## 2006-03-15 LAB — PROTIME-INR
INR: 1.8 — ABNORMAL LOW (ref 2.00–3.50)
Protime: 21.6 Seconds — ABNORMAL HIGH (ref 10.6–13.4)

## 2006-03-22 LAB — PROTIME-INR
INR: 2.7 (ref 2.00–3.50)
Protime: 32.4 Seconds — ABNORMAL HIGH (ref 10.6–13.4)

## 2006-04-05 LAB — CBC WITH DIFFERENTIAL/PLATELET
BASO%: 0.5 % (ref 0.0–2.0)
Basophils Absolute: 0 10*3/uL (ref 0.0–0.1)
EOS%: 1.3 % (ref 0.0–7.0)
Eosinophils Absolute: 0.1 10*3/uL (ref 0.0–0.5)
HCT: 36.9 % (ref 34.8–46.6)
HGB: 12.4 g/dL (ref 11.6–15.9)
LYMPH%: 32 % (ref 14.0–48.0)
MCH: 33.7 pg (ref 26.0–34.0)
MCHC: 33.6 g/dL (ref 32.0–36.0)
MCV: 100.1 fL (ref 81.0–101.0)
MONO#: 0.8 10*3/uL (ref 0.1–0.9)
MONO%: 13.8 % — ABNORMAL HIGH (ref 0.0–13.0)
NEUT#: 3 10*3/uL (ref 1.5–6.5)
NEUT%: 52.4 % (ref 39.6–76.8)
Platelets: 355 10*3/uL (ref 145–400)
RBC: 3.68 10*6/uL — ABNORMAL LOW (ref 3.70–5.32)
RDW: 13.9 % (ref 11.3–14.5)
WBC: 5.7 10*3/uL (ref 3.9–10.0)
lymph#: 1.8 10*3/uL (ref 0.9–3.3)

## 2006-04-05 LAB — PROTIME-INR
INR: 3.8 — ABNORMAL HIGH (ref 2.00–3.50)
Protime: 45.6 Seconds — ABNORMAL HIGH (ref 10.6–13.4)

## 2006-04-05 LAB — COMPREHENSIVE METABOLIC PANEL
ALT: 23 U/L (ref 0–35)
AST: 20 U/L (ref 0–37)
Albumin: 4 g/dL (ref 3.5–5.2)
Alkaline Phosphatase: 95 U/L (ref 39–117)
BUN: 16 mg/dL (ref 6–23)
CO2: 26 mEq/L (ref 19–32)
Calcium: 8.9 mg/dL (ref 8.4–10.5)
Chloride: 105 mEq/L (ref 96–112)
Creatinine, Ser: 0.91 mg/dL (ref 0.40–1.20)
Glucose, Bld: 89 mg/dL (ref 70–99)
Potassium: 4.2 mEq/L (ref 3.5–5.3)
Sodium: 140 mEq/L (ref 135–145)
Total Bilirubin: 0.4 mg/dL (ref 0.3–1.2)
Total Protein: 6.5 g/dL (ref 6.0–8.3)

## 2006-04-05 LAB — CA 125: CA 125: 7.4 U/mL (ref 0.0–30.2)

## 2006-04-11 ENCOUNTER — Ambulatory Visit: Payer: Self-pay | Admitting: Oncology

## 2006-04-13 LAB — PROTIME-INR
INR: 1 — ABNORMAL LOW (ref 2.00–3.50)
Protime: 12 Seconds (ref 10.6–13.4)

## 2006-04-19 LAB — PROTIME-INR
INR: 1.4 — ABNORMAL LOW (ref 2.00–3.50)
Protime: 16.8 Seconds — ABNORMAL HIGH (ref 10.6–13.4)

## 2006-04-26 LAB — COMPREHENSIVE METABOLIC PANEL
ALT: 25 U/L (ref 0–35)
AST: 20 U/L (ref 0–37)
Albumin: 3.9 g/dL (ref 3.5–5.2)
Alkaline Phosphatase: 86 U/L (ref 39–117)
BUN: 19 mg/dL (ref 6–23)
CO2: 27 mEq/L (ref 19–32)
Calcium: 8.6 mg/dL (ref 8.4–10.5)
Chloride: 104 mEq/L (ref 96–112)
Creatinine, Ser: 0.86 mg/dL (ref 0.40–1.20)
Glucose, Bld: 88 mg/dL (ref 70–99)
Potassium: 4.1 mEq/L (ref 3.5–5.3)
Sodium: 142 mEq/L (ref 135–145)
Total Bilirubin: 0.3 mg/dL (ref 0.3–1.2)
Total Protein: 6.4 g/dL (ref 6.0–8.3)

## 2006-04-26 LAB — CBC WITH DIFFERENTIAL/PLATELET
BASO%: 0.6 % (ref 0.0–2.0)
Basophils Absolute: 0 10*3/uL (ref 0.0–0.1)
EOS%: 1.4 % (ref 0.0–7.0)
Eosinophils Absolute: 0.1 10*3/uL (ref 0.0–0.5)
HCT: 38.2 % (ref 34.8–46.6)
HGB: 12.8 g/dL (ref 11.6–15.9)
LYMPH%: 31.1 % (ref 14.0–48.0)
MCH: 33.7 pg (ref 26.0–34.0)
MCHC: 33.5 g/dL (ref 32.0–36.0)
MCV: 100.5 fL (ref 81.0–101.0)
MONO#: 0.8 10*3/uL (ref 0.1–0.9)
MONO%: 13 % (ref 0.0–13.0)
NEUT#: 3.3 10*3/uL (ref 1.5–6.5)
NEUT%: 53.9 % (ref 39.6–76.8)
Platelets: 279 10*3/uL (ref 145–400)
RBC: 3.8 10*6/uL (ref 3.70–5.32)
RDW: 13.4 % (ref 11.3–14.5)
WBC: 6.1 10*3/uL (ref 3.9–10.0)
lymph#: 1.9 10*3/uL (ref 0.9–3.3)

## 2006-04-26 LAB — CA 125: CA 125: 6.9 U/mL (ref 0.0–30.2)

## 2006-04-26 LAB — PROTHROMBIN TIME
INR: 3.2 — ABNORMAL HIGH (ref 0.0–1.5)
Prothrombin Time: 34.4 seconds — ABNORMAL HIGH (ref 11.6–15.2)

## 2006-05-02 LAB — CBC WITH DIFFERENTIAL/PLATELET
BASO%: 1.1 % (ref 0.0–2.0)
Basophils Absolute: 0.1 10*3/uL (ref 0.0–0.1)
EOS%: 1.4 % (ref 0.0–7.0)
Eosinophils Absolute: 0.1 10*3/uL (ref 0.0–0.5)
HCT: 36.3 % (ref 34.8–46.6)
HGB: 12 g/dL (ref 11.6–15.9)
LYMPH%: 34.3 % (ref 14.0–48.0)
MCH: 33.1 pg (ref 26.0–34.0)
MCHC: 33.1 g/dL (ref 32.0–36.0)
MCV: 100 fL (ref 81.0–101.0)
MONO#: 0.9 10*3/uL (ref 0.1–0.9)
MONO%: 14.3 % — ABNORMAL HIGH (ref 0.0–13.0)
NEUT#: 3.1 10*3/uL (ref 1.5–6.5)
NEUT%: 49 % (ref 39.6–76.8)
Platelets: 335 10*3/uL (ref 145–400)
RBC: 3.62 10*6/uL — ABNORMAL LOW (ref 3.70–5.32)
RDW: 11.5 % (ref 11.3–14.5)
WBC: 6.4 10*3/uL (ref 3.9–10.0)
lymph#: 2.2 10*3/uL (ref 0.9–3.3)

## 2006-05-02 LAB — PROTIME-INR
INR: 4.2 — ABNORMAL HIGH (ref 2.00–3.50)
Protime: 50.4 Seconds — ABNORMAL HIGH (ref 10.6–13.4)

## 2006-05-15 ENCOUNTER — Encounter: Admission: RE | Admit: 2006-05-15 | Discharge: 2006-05-15 | Payer: Self-pay | Admitting: Oncology

## 2006-06-14 ENCOUNTER — Ambulatory Visit (HOSPITAL_COMMUNITY): Admission: RE | Admit: 2006-06-14 | Discharge: 2006-06-14 | Payer: Self-pay | Admitting: General Surgery

## 2006-07-28 ENCOUNTER — Ambulatory Visit: Payer: Self-pay | Admitting: Oncology

## 2006-08-07 LAB — CA 125: CA 125: 7.4 U/mL (ref 0.0–30.2)

## 2006-08-18 ENCOUNTER — Ambulatory Visit: Admission: RE | Admit: 2006-08-18 | Discharge: 2006-08-18 | Payer: Self-pay | Admitting: Gynecology

## 2006-10-25 ENCOUNTER — Ambulatory Visit: Payer: Self-pay | Admitting: Oncology

## 2006-10-27 LAB — COMPREHENSIVE METABOLIC PANEL
ALT: 19 U/L (ref 0–35)
AST: 19 U/L (ref 0–37)
Albumin: 3.9 g/dL (ref 3.5–5.2)
Alkaline Phosphatase: 98 U/L (ref 39–117)
BUN: 18 mg/dL (ref 6–23)
CO2: 28 mEq/L (ref 19–32)
Calcium: 9.1 mg/dL (ref 8.4–10.5)
Chloride: 103 mEq/L (ref 96–112)
Creatinine, Ser: 0.96 mg/dL (ref 0.40–1.20)
Glucose, Bld: 105 mg/dL — ABNORMAL HIGH (ref 70–99)
Potassium: 4.7 mEq/L (ref 3.5–5.3)
Sodium: 141 mEq/L (ref 135–145)
Total Bilirubin: 0.4 mg/dL (ref 0.3–1.2)
Total Protein: 6.7 g/dL (ref 6.0–8.3)

## 2006-10-27 LAB — CBC WITH DIFFERENTIAL/PLATELET
BASO%: 0.6 % (ref 0.0–2.0)
Basophils Absolute: 0 10*3/uL (ref 0.0–0.1)
EOS%: 1.3 % (ref 0.0–7.0)
Eosinophils Absolute: 0.1 10*3/uL (ref 0.0–0.5)
HCT: 36.6 % (ref 34.8–46.6)
HGB: 12.4 g/dL (ref 11.6–15.9)
LYMPH%: 30.8 % (ref 14.0–48.0)
MCH: 33.8 pg (ref 26.0–34.0)
MCHC: 33.9 g/dL (ref 32.0–36.0)
MCV: 99.5 fL (ref 81.0–101.0)
MONO#: 1 10*3/uL — ABNORMAL HIGH (ref 0.1–0.9)
MONO%: 13.9 % — ABNORMAL HIGH (ref 0.0–13.0)
NEUT#: 4 10*3/uL (ref 1.5–6.5)
NEUT%: 53.4 % (ref 39.6–76.8)
Platelets: 313 10*3/uL (ref 145–400)
RBC: 3.68 10*6/uL — ABNORMAL LOW (ref 3.70–5.32)
RDW: 13.2 % (ref 11.3–14.5)
WBC: 7.4 10*3/uL (ref 3.9–10.0)
lymph#: 2.3 10*3/uL (ref 0.9–3.3)

## 2006-10-27 LAB — CA 125: CA 125: 7.8 U/mL (ref 0.0–30.2)

## 2007-02-14 ENCOUNTER — Ambulatory Visit: Payer: Self-pay | Admitting: Oncology

## 2007-02-16 LAB — CA 125: CA 125: 6.7 U/mL (ref 0.0–30.2)

## 2007-02-23 ENCOUNTER — Ambulatory Visit: Admission: RE | Admit: 2007-02-23 | Discharge: 2007-02-23 | Payer: Self-pay | Admitting: Gynecology

## 2007-04-26 ENCOUNTER — Ambulatory Visit: Payer: Self-pay | Admitting: Oncology

## 2007-04-30 LAB — CBC WITH DIFFERENTIAL/PLATELET
BASO%: 1.1 % (ref 0.0–2.0)
Basophils Absolute: 0.1 10*3/uL (ref 0.0–0.1)
EOS%: 1 % (ref 0.0–7.0)
Eosinophils Absolute: 0.1 10*3/uL (ref 0.0–0.5)
HCT: 36.6 % (ref 34.8–46.6)
HGB: 12.8 g/dL (ref 11.6–15.9)
LYMPH%: 20.7 % (ref 14.0–48.0)
MCH: 33.4 pg (ref 26.0–34.0)
MCHC: 35.1 g/dL (ref 32.0–36.0)
MCV: 95.3 fL (ref 81.0–101.0)
MONO#: 0.9 10*3/uL (ref 0.1–0.9)
MONO%: 12.3 % (ref 0.0–13.0)
NEUT#: 4.7 10*3/uL (ref 1.5–6.5)
NEUT%: 64.9 % (ref 39.6–76.8)
Platelets: 349 10*3/uL (ref 145–400)
RBC: 3.84 10*6/uL (ref 3.70–5.32)
RDW: 10.9 % — ABNORMAL LOW (ref 11.3–14.5)
WBC: 7.2 10*3/uL (ref 3.9–10.0)
lymph#: 1.5 10*3/uL (ref 0.9–3.3)

## 2007-04-30 LAB — COMPREHENSIVE METABOLIC PANEL
ALT: 17 U/L (ref 0–35)
AST: 18 U/L (ref 0–37)
Albumin: 3.8 g/dL (ref 3.5–5.2)
Alkaline Phosphatase: 84 U/L (ref 39–117)
BUN: 15 mg/dL (ref 6–23)
CO2: 26 mEq/L (ref 19–32)
Calcium: 8.9 mg/dL (ref 8.4–10.5)
Chloride: 105 mEq/L (ref 96–112)
Creatinine, Ser: 0.89 mg/dL (ref 0.40–1.20)
Glucose, Bld: 74 mg/dL (ref 70–99)
Potassium: 4 mEq/L (ref 3.5–5.3)
Sodium: 139 mEq/L (ref 135–145)
Total Bilirubin: 0.4 mg/dL (ref 0.3–1.2)
Total Protein: 6.7 g/dL (ref 6.0–8.3)

## 2007-04-30 LAB — CA 125: CA 125: 6.2 U/mL (ref 0.0–30.2)

## 2007-05-25 ENCOUNTER — Encounter: Admission: RE | Admit: 2007-05-25 | Discharge: 2007-05-25 | Payer: Self-pay | Admitting: Family Medicine

## 2007-06-27 LAB — CONVERTED CEMR LAB: Pap Smear: NORMAL

## 2007-08-03 ENCOUNTER — Ambulatory Visit: Payer: Self-pay | Admitting: Oncology

## 2007-08-07 LAB — CA 125: CA 125: 4.7 U/mL (ref 0.0–30.2)

## 2007-08-17 ENCOUNTER — Ambulatory Visit: Admission: RE | Admit: 2007-08-17 | Discharge: 2007-08-17 | Payer: Self-pay | Admitting: Gynecology

## 2007-10-21 ENCOUNTER — Ambulatory Visit: Payer: Self-pay | Admitting: Oncology

## 2007-10-29 LAB — COMPREHENSIVE METABOLIC PANEL
ALT: 16 U/L (ref 0–35)
AST: 16 U/L (ref 0–37)
Albumin: 4 g/dL (ref 3.5–5.2)
Alkaline Phosphatase: 70 U/L (ref 39–117)
BUN: 17 mg/dL (ref 6–23)
CO2: 24 mEq/L (ref 19–32)
Calcium: 8.6 mg/dL (ref 8.4–10.5)
Chloride: 105 mEq/L (ref 96–112)
Creatinine, Ser: 0.99 mg/dL (ref 0.40–1.20)
Glucose, Bld: 83 mg/dL (ref 70–99)
Potassium: 3.6 mEq/L (ref 3.5–5.3)
Sodium: 140 mEq/L (ref 135–145)
Total Bilirubin: 0.3 mg/dL (ref 0.3–1.2)
Total Protein: 6.6 g/dL (ref 6.0–8.3)

## 2007-10-29 LAB — CBC WITH DIFFERENTIAL/PLATELET
BASO%: 0.7 % (ref 0.0–2.0)
Basophils Absolute: 0.1 10*3/uL (ref 0.0–0.1)
EOS%: 1.6 % (ref 0.0–7.0)
Eosinophils Absolute: 0.1 10*3/uL (ref 0.0–0.5)
HCT: 37.7 % (ref 34.8–46.6)
HGB: 12.5 g/dL (ref 11.6–15.9)
LYMPH%: 28.3 % (ref 14.0–48.0)
MCH: 32.4 pg (ref 26.0–34.0)
MCHC: 33.1 g/dL (ref 32.0–36.0)
MCV: 98 fL (ref 81.0–101.0)
MONO#: 0.8 10*3/uL (ref 0.1–0.9)
MONO%: 10.6 % (ref 0.0–13.0)
NEUT#: 4.7 10*3/uL (ref 1.5–6.5)
NEUT%: 58.8 % (ref 39.6–76.8)
Platelets: 299 10*3/uL (ref 145–400)
RBC: 3.85 10*6/uL (ref 3.70–5.32)
RDW: 13.9 % (ref 11.3–14.5)
WBC: 8 10*3/uL (ref 3.9–10.0)
lymph#: 2.3 10*3/uL (ref 0.9–3.3)

## 2007-10-29 LAB — CA 125: CA 125: 5.2 U/mL (ref 0.0–30.2)

## 2007-12-29 LAB — HM COLONOSCOPY: HM Colonoscopy: NEGATIVE

## 2008-02-20 ENCOUNTER — Ambulatory Visit: Payer: Self-pay | Admitting: Oncology

## 2008-02-26 LAB — CA 125: CA 125: 6.3 U/mL (ref 0.0–30.2)

## 2008-02-29 ENCOUNTER — Ambulatory Visit: Admission: RE | Admit: 2008-02-29 | Discharge: 2008-02-29 | Payer: Self-pay | Admitting: Gynecology

## 2008-05-26 ENCOUNTER — Encounter: Admission: RE | Admit: 2008-05-26 | Discharge: 2008-05-26 | Payer: Self-pay | Admitting: Oncology

## 2008-05-26 ENCOUNTER — Encounter: Payer: Self-pay | Admitting: Family Medicine

## 2008-06-06 ENCOUNTER — Ambulatory Visit: Payer: Self-pay | Admitting: Oncology

## 2008-06-10 LAB — COMPREHENSIVE METABOLIC PANEL
ALT: 17 U/L (ref 0–35)
AST: 21 U/L (ref 0–37)
Albumin: 3.5 g/dL (ref 3.5–5.2)
Alkaline Phosphatase: 77 U/L (ref 39–117)
BUN: 15 mg/dL (ref 6–23)
CO2: 28 mEq/L (ref 19–32)
Calcium: 9 mg/dL (ref 8.4–10.5)
Chloride: 106 mEq/L (ref 96–112)
Creatinine, Ser: 0.92 mg/dL (ref 0.40–1.20)
Glucose, Bld: 95 mg/dL (ref 70–99)
Potassium: 4.1 mEq/L (ref 3.5–5.3)
Sodium: 141 mEq/L (ref 135–145)
Total Bilirubin: 0.4 mg/dL (ref 0.3–1.2)
Total Protein: 6.7 g/dL (ref 6.0–8.3)

## 2008-06-10 LAB — CA 125: CA 125: 7.7 U/mL (ref 0.0–30.2)

## 2008-06-10 LAB — CBC WITH DIFFERENTIAL/PLATELET
BASO%: 0.5 % (ref 0.0–2.0)
Basophils Absolute: 0 10*3/uL (ref 0.0–0.1)
EOS%: 2 % (ref 0.0–7.0)
Eosinophils Absolute: 0.2 10*3/uL (ref 0.0–0.5)
HCT: 36.3 % (ref 34.8–46.6)
HGB: 12 g/dL (ref 11.6–15.9)
LYMPH%: 25.2 % (ref 14.0–49.7)
MCH: 31.8 pg (ref 25.1–34.0)
MCHC: 33.1 g/dL (ref 31.5–36.0)
MCV: 96.2 fL (ref 79.5–101.0)
MONO#: 1.1 10*3/uL — ABNORMAL HIGH (ref 0.1–0.9)
MONO%: 12.1 % (ref 0.0–14.0)
NEUT#: 5.5 10*3/uL (ref 1.5–6.5)
NEUT%: 60.2 % (ref 38.4–76.8)
Platelets: 325 10*3/uL (ref 145–400)
RBC: 3.78 10*6/uL (ref 3.70–5.45)
RDW: 13.3 % (ref 11.2–14.5)
WBC: 9.1 10*3/uL (ref 3.9–10.3)
lymph#: 2.3 10*3/uL (ref 0.9–3.3)

## 2008-06-17 LAB — ERYTHROCYTE SEDIMENTATION RATE: Sed Rate: 23 mm/hr (ref 0–30)

## 2008-06-18 LAB — URIC ACID: Uric Acid, Serum: 4.1 mg/dL (ref 2.4–7.0)

## 2008-06-18 LAB — ANTI-NUCLEAR AB-TITER (ANA TITER): ANA Titer 1: NEGATIVE

## 2008-06-18 LAB — VITAMIN D 25 HYDROXY (VIT D DEFICIENCY, FRACTURES): Vit D, 25-Hydroxy: 31 ng/mL (ref 30–89)

## 2008-06-18 LAB — RHEUMATOID FACTOR: Rhuematoid fact SerPl-aCnc: <20 IU/mL (ref 0–20)

## 2008-06-18 LAB — ANA: Anti Nuclear Antibody(ANA): POSITIVE — AB

## 2008-06-30 ENCOUNTER — Ambulatory Visit: Payer: Self-pay | Admitting: Family Medicine

## 2008-06-30 DIAGNOSIS — Z8543 Personal history of malignant neoplasm of ovary: Secondary | ICD-10-CM

## 2008-06-30 DIAGNOSIS — R635 Abnormal weight gain: Secondary | ICD-10-CM

## 2008-06-30 HISTORY — DX: Personal history of malignant neoplasm of ovary: Z85.43

## 2008-06-30 HISTORY — DX: Abnormal weight gain: R63.5

## 2008-07-07 ENCOUNTER — Telehealth: Payer: Self-pay | Admitting: Family Medicine

## 2008-07-25 ENCOUNTER — Ambulatory Visit: Payer: Self-pay | Admitting: Oncology

## 2008-08-04 ENCOUNTER — Ambulatory Visit: Payer: Self-pay | Admitting: Family Medicine

## 2008-12-10 ENCOUNTER — Ambulatory Visit: Payer: Self-pay | Admitting: Oncology

## 2008-12-12 LAB — CA 125: CA 125: 6.7 U/mL (ref 0.0–30.2)

## 2008-12-19 ENCOUNTER — Ambulatory Visit: Admission: RE | Admit: 2008-12-19 | Discharge: 2008-12-19 | Payer: Self-pay | Admitting: Gynecology

## 2009-03-06 ENCOUNTER — Ambulatory Visit: Payer: Self-pay | Admitting: Family Medicine

## 2009-03-06 DIAGNOSIS — J069 Acute upper respiratory infection, unspecified: Secondary | ICD-10-CM

## 2009-03-06 HISTORY — DX: Acute upper respiratory infection, unspecified: J06.9

## 2009-03-28 HISTORY — PX: CHOLECYSTECTOMY: SHX55

## 2009-05-14 ENCOUNTER — Emergency Department (HOSPITAL_COMMUNITY): Admission: EM | Admit: 2009-05-14 | Discharge: 2009-05-14 | Payer: Self-pay | Admitting: Emergency Medicine

## 2009-05-19 ENCOUNTER — Ambulatory Visit: Payer: Self-pay | Admitting: Family Medicine

## 2009-05-19 DIAGNOSIS — S6390XA Sprain of unspecified part of unspecified wrist and hand, initial encounter: Secondary | ICD-10-CM

## 2009-05-19 DIAGNOSIS — R51 Headache: Secondary | ICD-10-CM

## 2009-05-19 DIAGNOSIS — R519 Headache, unspecified: Secondary | ICD-10-CM | POA: Insufficient documentation

## 2009-05-19 DIAGNOSIS — M542 Cervicalgia: Secondary | ICD-10-CM

## 2009-05-19 HISTORY — DX: Sprain of unspecified part of unspecified wrist and hand, initial encounter: S63.90XA

## 2009-05-19 HISTORY — DX: Headache: R51

## 2009-05-19 HISTORY — DX: Cervicalgia: M54.2

## 2009-05-20 ENCOUNTER — Encounter: Payer: Self-pay | Admitting: Family Medicine

## 2009-05-20 ENCOUNTER — Telehealth: Payer: Self-pay | Admitting: Family Medicine

## 2009-05-28 ENCOUNTER — Telehealth: Payer: Self-pay | Admitting: Family Medicine

## 2009-06-05 ENCOUNTER — Ambulatory Visit: Payer: Self-pay | Admitting: Oncology

## 2009-06-09 LAB — COMPREHENSIVE METABOLIC PANEL
ALT: 18 U/L (ref 0–35)
AST: 23 U/L (ref 0–37)
Albumin: 3.9 g/dL (ref 3.5–5.2)
Alkaline Phosphatase: 64 U/L (ref 39–117)
BUN: 12 mg/dL (ref 6–23)
CO2: 29 mEq/L (ref 19–32)
Calcium: 9.1 mg/dL (ref 8.4–10.5)
Chloride: 104 mEq/L (ref 96–112)
Creatinine, Ser: 1.11 mg/dL (ref 0.40–1.20)
Glucose, Bld: 85 mg/dL (ref 70–99)
Potassium: 3.4 mEq/L — ABNORMAL LOW (ref 3.5–5.3)
Sodium: 141 mEq/L (ref 135–145)
Total Bilirubin: 0.7 mg/dL (ref 0.3–1.2)
Total Protein: 6.9 g/dL (ref 6.0–8.3)

## 2009-06-09 LAB — CBC WITH DIFFERENTIAL/PLATELET
BASO%: 0.7 % (ref 0.0–2.0)
Basophils Absolute: 0.1 10*3/uL (ref 0.0–0.1)
EOS%: 2.6 % (ref 0.0–7.0)
Eosinophils Absolute: 0.2 10*3/uL (ref 0.0–0.5)
HCT: 39.3 % (ref 34.8–46.6)
HGB: 13.1 g/dL (ref 11.6–15.9)
LYMPH%: 29 % (ref 14.0–49.7)
MCH: 32.9 pg (ref 25.1–34.0)
MCHC: 33.4 g/dL (ref 31.5–36.0)
MCV: 98.5 fL (ref 79.5–101.0)
MONO#: 0.7 10*3/uL (ref 0.1–0.9)
MONO%: 10 % (ref 0.0–14.0)
NEUT#: 4.3 10*3/uL (ref 1.5–6.5)
NEUT%: 57.7 % (ref 38.4–76.8)
Platelets: 260 10*3/uL (ref 145–400)
RBC: 3.98 10*6/uL (ref 3.70–5.45)
RDW: 14.2 % (ref 11.2–14.5)
WBC: 7.4 10*3/uL (ref 3.9–10.3)
lymph#: 2.2 10*3/uL (ref 0.9–3.3)

## 2009-06-09 LAB — CA 125: CA 125: 6.8 U/mL (ref 0.0–30.2)

## 2009-06-16 ENCOUNTER — Encounter: Payer: Self-pay | Admitting: Family Medicine

## 2009-06-24 ENCOUNTER — Encounter: Admission: RE | Admit: 2009-06-24 | Discharge: 2009-06-24 | Payer: Self-pay | Admitting: Family Medicine

## 2009-09-15 ENCOUNTER — Emergency Department (HOSPITAL_COMMUNITY): Admission: EM | Admit: 2009-09-15 | Discharge: 2009-09-15 | Payer: Self-pay | Admitting: Emergency Medicine

## 2009-10-14 ENCOUNTER — Ambulatory Visit: Payer: Self-pay | Admitting: Oncology

## 2009-10-16 ENCOUNTER — Inpatient Hospital Stay (HOSPITAL_COMMUNITY): Admission: RE | Admit: 2009-10-16 | Discharge: 2009-10-20 | Payer: Self-pay | Admitting: General Surgery

## 2009-10-16 ENCOUNTER — Ambulatory Visit: Payer: Self-pay | Admitting: Gastroenterology

## 2009-10-16 LAB — CA 125: CA 125: 6.9 U/mL (ref 0.0–30.2)

## 2009-10-17 ENCOUNTER — Encounter: Payer: Self-pay | Admitting: Gastroenterology

## 2009-10-19 ENCOUNTER — Encounter (INDEPENDENT_AMBULATORY_CARE_PROVIDER_SITE_OTHER): Payer: Self-pay | Admitting: General Surgery

## 2009-11-02 ENCOUNTER — Encounter: Payer: Self-pay | Admitting: Gastroenterology

## 2009-12-21 ENCOUNTER — Ambulatory Visit: Payer: Self-pay | Admitting: Oncology

## 2009-12-23 LAB — CA 125: CA 125: 5.8 U/mL (ref 0.0–30.2)

## 2010-01-01 ENCOUNTER — Ambulatory Visit: Admission: RE | Admit: 2010-01-01 | Discharge: 2010-01-01 | Payer: Self-pay | Admitting: Gynecology

## 2010-01-12 ENCOUNTER — Telehealth: Payer: Self-pay | Admitting: Family Medicine

## 2010-04-21 LAB — HM MAMMOGRAPHY: HM Mammogram: NEGATIVE
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
## 2010-04-27 NOTE — Progress Notes (Signed)
Summary: Refill Fosamax  Phone Note From Pharmacy        Prescriptions: FOSAMAX 10 MG TABS (ALENDRONATE SODIUM) one by mouth weekly  #4 x 6   Entered by:   Sid Falcon LPN   Authorized by:   Evelena Peat MD   Signed by:   Sid Falcon LPN on 96/29/5284   Method used:   Electronically to        [facility] ([facility]       [address]       [place], [place]  [address]       Ph: [phone] or [phone]       Fax: [phone]   RxID:   [id number]  Faxed Rx requested from pt pharmacy.  Sent refills electronically Sid Falcon LPN  July 07, 2008 3:50 PM  Appended Document: Refill Fosamax Received a call from pt pharmacy the pt hasa been on Fosamax 70mg , not 10 mg.  Changed dose on pt med list. Sid Falcon LPN  July 09, 2008 10:08 AM

## 2010-04-27 NOTE — Progress Notes (Signed)
Summary: Hydrocodone cough syrup refill request  Phone Note From Pharmacy   Caller: [facility] Call For: burchette  Summary of Call: Hydrocodone-Chlorpheniram susp refill request, fax received,  Last filled 05-29-2009 Initial call taken by: Sid Falcon LPN,  January 12, 2010 2:35 PM  Follow-up for Phone Call        may refill once 120 ml. Follow-up by: Evelena Peat MD,  January 12, 2010 5:44 PM    Prescriptions: Sandria Senter ER 8-10 MG/5ML LQCR (CHLORPHENIRAMINE-HYDROCODONE) take one tsp every 12 hours as needed cough  #160ml x 0   Entered by:   Sid Falcon LPN   Authorized by:   Evelena Peat MD   Signed by:   Sid Falcon LPN on 95/62/1308   Method used:   Telephoned to ...       [facility] ([facility]       [address]       [place], [place]  [address]       Ph: [phone] or [phone]       Fax: [phone]   RxID:   [id number]

## 2010-04-27 NOTE — Letter (Signed)
[facility]   Imported By: Maryln Gottron 07/10/2009 10:17:21  _____________________________________________________________________  External Attachment:    Type:   Image     Comment:   External Document

## 2010-04-27 NOTE — Letter (Signed)
Summary: Out of Work  Adult nurse at [employer]  [address]   [place], [place] [address]   Phone: [phone]  Fax: [phone]    May 20, 2009       Employee:  [name]    To Whom It May Concern:   For Medical reasons, please excuse the above named employee from work for the following dates:  Start:   05/19/2009  End:   05/22/2009  If you need additional information, please feel free to contact our office.         Sincerely,       Evelena Peat, MD
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
## 2010-04-27 NOTE — Letter (Signed)
Summary: [facility]   Imported By: Lester Sibley 11/20/2009 09:43:00  _____________________________________________________________________  External Attachment:    Type:   Image     Comment:   External Document

## 2010-04-27 NOTE — Assessment & Plan Note (Signed)
Summary: FLU AND ARTHRITIS IN KNEES/JLS   Vital Signs:  Patient profile:   66 year old female Height:      61.5 inches Weight:      171 pounds BMI:     31.90 Temp:     98.8 degrees F oral BP sitting:   110 / 70  (left arm) Cuff size:   regular  Vitals Entered By: Sid Falcon LPN (June 30, 1608 2:19 PM)  History of Present Illness: The patient is a pleasant 65 year old female seen to establish care. Her past history is significant for the fact she was diagnosed with ovarian cancer 2007. She had the hysterectomy and appendectomy at that time he has been in full remission. She also had splenectomy as her spleen was accidentally punctured at the time of her surgery. She's had prior Pneumovax. She sees an oncologist regularly. She does have history of osteoporosis which is treated with Fosamax. She is tolerating that treatment without side effect.  The patient relates about 30-35 pounds of weight gain following her chemotherapy after her cancer. She has had thyroid functions and other chemistries in the past year that were normal. Has previously used Adipex with good success and had no side effects. She is requesting a refill of that today. She has no history of hypertension or any cardiovascular or cerebrovascular problems.  Preventive Screening-Counseling & Management     Smoking Status: never  Allergies (verified): 1)  ! Penicillin V Potassium (Penicillin V Potassium)  Past History:  Family History:    Family History of Arthritis     (06/30/2008)  Social History:    Occupation: Principal    Married    Never Smoked    Alcohol use-no     (06/30/2008)  Risk Factors:    Alcohol Use: N/A    >5 drinks/d w/in last 3 months: N/A    Caffeine Use: N/A    Diet: N/A    Exercise: N/A  Risk Factors:    Smoking Status: never (06/30/2008)    Packs/Day: N/A    Cigars/wk: N/A    Pipe Use/wk: N/A    Cans of tobacco/wk: N/A    Passive Smoke Exposure: N/A  Past Medical History:  Arthritis    Ovarian CA, 2007  Past Surgical History:    Appendectomy 2007    Hysterectomy 2007    Ovarian CA 2007    C-Section 1982, 1976    Splenectomy 2007  Family History:    Family History of Arthritis  Social History:    Occupation: Principal    Married    Never Smoked    Alcohol use-no    Occupation:  employed    Smoking Status:  never  Review of Systems       The patient complains of weight gain.  The patient denies fever, chest pain, syncope, dyspnea on exertion, peripheral edema, prolonged cough, headaches, hemoptysis, abdominal pain, hematochezia, hematuria, and enlarged lymph nodes.    Physical Exam  General:  patient is alert in no distress Mouth:  Oral mucosa and oropharynx without lesions or exudates.  Teeth in good repair. Neck:  No deformities, masses, or tenderness noted. Lungs:  Normal respiratory effort, chest expands symmetrically. Lungs are clear to auscultation, no crackles or wheezes. Heart:  Normal rate and regular rhythm. S1 and S2 normal without gallop, murmur, click, rub or other extra sounds. Extremities:  No clubbing, cyanosis, edema, or deformity noted with normal full range of motion of all joints.  Impression & Recommendations:  Problem # 1:  WEIGHT GAIN (ICD-783.1) Metabolic etiology such as hypothyroidism have been ruled out the past year. Patient is walking fairly regularly. She does have some areas of dietary modification and she can make.  Patient specifically requesting refill of phentermine. We have gone over potential risks with this. She's never misuses anyway. We explained this is not in any way replace diet or exercise. She does not have any contraindications for short-term use but we've explained our reluctance to use this more than a few months consecutively.  Also needs f/u in 1 month to reassess.  Problem # 2:  NEOPLASM, MALIGNANT, OVARY, HX OF (ICD-V10.43) Assessment: Comment Only  Complete Medication List: 1)  Vitamin E  Complex 400 Unit Caps (Vitamin e) .... Once daily 2)  Vitamin D3 1000 Unit Caps (Cholecalciferol) .... Once daily 3)  Fosamax 10 Mg Tabs (Alendronate sodium) .... One by mouth weekly 4)  Adipex-p 37.5 Mg Tabs (Phentermine hcl) .Marland Kitchen.. 1 by mouth once daily  Patient Instructions: 1)  Please schedule a follow-up appointment in 1 month.  2)  It is important that you exercise reguarly at least 20 minutes 5 times a week. If you develop chest pain, have severe difficulty breathing, or feel very tired, stop exercising immediately and seek medical attention.  Prescriptions: ADIPEX-P 37.5 MG TABS (PHENTERMINE HCL) 1 by mouth once daily  #30 x 1   Entered and Authorized by:   Evelena Peat MD   Signed by:   Evelena Peat MD on 06/30/2008   Method used:   Print then Give to Patient   RxID:   [id number]       Preventive Care Screening  Mammogram:    Date:  03/28/2008    Results:  normal   Pap Smear:    Date:  06/27/2007    Results:  normal   Last Tetanus Booster:    Date:  03/28/2005    Results:  Td

## 2010-04-27 NOTE — Progress Notes (Signed)
Summary: Hydrocodone-Chlorpheniram susp refill request  Phone Note From Pharmacy   [facility] Call For: Burchette  Summary of Call: Rx request to refill Hydrocodone-Chlorpheniram susp, .  Take 1 tsp every 12 hours as needed cough This med was given to pt at [facility] on 03/06/2009   Follow-up for Phone Call        may refill once. Follow-up by: Evelena Peat MD,  May 29, 2009 11:05 AM  Additional Follow-up for Phone Call Additional follow up Details #1::        Rx called in Additional Follow-up by: Sid Falcon LPN,  May 29, 2009 11:14 AM    New/Updated Medications: Sandria Senter ER 8-10 MG/5ML LQCR (CHLORPHENIRAMINE-HYDROCODONE) take one tsp every 12 hours as needed cough Prescriptions: TUSSIONEX PENNKINETIC ER 8-10 MG/5ML LQCR (CHLORPHENIRAMINE-HYDROCODONE) take one tsp every 12 hours as needed cough  #192ml x 0   Entered by:   Sid Falcon LPN   Authorized by:   Evelena Peat MD   Signed by:   Sid Falcon LPN on 57/84/6962   Method used:   Telephoned to ...       [facility]       [address]       [place], [place]  [address]       Ph: [phone] or [phone]       Fax: [phone]   RxID:   [id number]

## 2010-04-27 NOTE — Procedures (Signed)
Summary: ERCP  Patient: [name] Note: All result statuses are Final unless otherwise noted.  Tests: (1) ERCP (ERC)   ERC ERCP                  DONE     [name]     [address]     [place], [place]  [address]           ERCP PROCEDURE REPORT           PATIENT:  [name], [name]  MR#:  [id number]     BIRTHDATE:  1945-04-21  GENDER:  female     ENDOSCOPIST:  Rachael Fee, MD     PROCEDURE DATE:  10/17/2009     PROCEDURE:  ERCP with removal of stones, ERCP with sphincterotomy     INDICATIONS:  suspected stone, elevated liver tests, stones in GB           MEDICATIONS:  Fentanyl 140 mcg IV, Versed 13 mg IV, glucagon 0.5     mg IV, on IV cipro     TOPICAL ANESTHETIC:  none           DESCRIPTION OF PROCEDURE:   After the risks benefits and     alternatives of the procedure were thoroughly explained, informed     consent was obtained.  The XB-[id number] ([id number]) endoscope was     introduced through the mouth and advanced to the second portion of     the duodenum without detailed examination of the UGI tract.  A     normal appearing major papilla was identified.  The bile duct was     cannulated using a .035 hydrawire and Autotome.  An identical wire     was temporarily placed into the main PD to facilitate biliary     cannulation.  Dye was injected into bile duct revealing normal     diameter CBD, intrahepatics, patent cystic duct and GB partially     opacified.   A single small mobile filling defect was noted and a     second fairly amorphous filling defect was seen as well.  An     adequate biliary sphincterotomy was performed over the wire and     then a balloon was used to sweep the biliary tree several times.     Dark bile and some small stone fragments, debris were delivered     into the duodenum.  There was no purulence.  An occlusion,     completion dye injection was normal.  The main pancreatic duct was     never injected with dye.  There was  minor post-sphincterotomy     oozing, this stopped without any treatment.           <<PROCEDUREIMAGES>>           Impression:     Choledocholithiasis, debris in CBD.  Treated with biliary     sphincterotomy and balloon sweeping.     Proceed with cholecystectomy as previously planned.           ______________________________     Rachael Fee, MD           cc: Christina Peace, MD           n.     [name]:   Rachael Fee at 10/17/2009 12:19 PM           [name], [id number]  Note: An exclamation mark (!) indicates a  result that was not dispersed into the flowsheet. Document Creation Date: 10/17/2009 12:20 PM _______________________________________________________________________  (1) Order result status: Final Collection or observation date-time: 10/17/2009 11:58 Requested date-time:  Receipt date-time:  Reported date-time:  Referring Physician:   Ordering Physician: Rob Bunting [phone]) Specimen Source:  Source: Launa Grill Order Number: [id number] Lab site:

## 2010-06-12 LAB — DIFFERENTIAL
Basophils Absolute: 0 10*3/uL (ref 0.0–0.1)
Basophils Relative: 1 % (ref 0–1)
Eosinophils Absolute: 0.2 10*3/uL (ref 0.0–0.7)
Eosinophils Relative: 2 % (ref 0–5)
Lymphocytes Relative: 31 % (ref 12–46)
Lymphs Abs: 2.2 10*3/uL (ref 0.7–4.0)
Monocytes Absolute: 0.9 10*3/uL (ref 0.1–1.0)
Monocytes Relative: 13 % — ABNORMAL HIGH (ref 3–12)
Neutro Abs: 3.9 10*3/uL (ref 1.7–7.7)
Neutrophils Relative %: 54 % (ref 43–77)

## 2010-06-12 LAB — COMPREHENSIVE METABOLIC PANEL
ALT: 188 U/L — ABNORMAL HIGH (ref 0–35)
ALT: 279 U/L — ABNORMAL HIGH (ref 0–35)
ALT: 310 U/L — ABNORMAL HIGH (ref 0–35)
ALT: 344 U/L — ABNORMAL HIGH (ref 0–35)
AST: 149 U/L — ABNORMAL HIGH (ref 0–37)
AST: 262 U/L — ABNORMAL HIGH (ref 0–37)
AST: 320 U/L — ABNORMAL HIGH (ref 0–37)
AST: 82 U/L — ABNORMAL HIGH (ref 0–37)
Albumin: 2.8 g/dL — ABNORMAL LOW (ref 3.5–5.2)
Albumin: 2.9 g/dL — ABNORMAL LOW (ref 3.5–5.2)
Albumin: 3.2 g/dL — ABNORMAL LOW (ref 3.5–5.2)
Albumin: 3.6 g/dL (ref 3.5–5.2)
Alkaline Phosphatase: 153 U/L — ABNORMAL HIGH (ref 39–117)
Alkaline Phosphatase: 186 U/L — ABNORMAL HIGH (ref 39–117)
Alkaline Phosphatase: 189 U/L — ABNORMAL HIGH (ref 39–117)
Alkaline Phosphatase: 191 U/L — ABNORMAL HIGH (ref 39–117)
BUN: 10 mg/dL (ref 6–23)
BUN: 3 mg/dL — ABNORMAL LOW (ref 6–23)
BUN: 4 mg/dL — ABNORMAL LOW (ref 6–23)
BUN: 8 mg/dL (ref 6–23)
CO2: 25 mEq/L (ref 19–32)
CO2: 27 mEq/L (ref 19–32)
CO2: 28 mEq/L (ref 19–32)
CO2: 29 mEq/L (ref 19–32)
Calcium: 8.6 mg/dL (ref 8.4–10.5)
Calcium: 8.6 mg/dL (ref 8.4–10.5)
Calcium: 8.8 mg/dL (ref 8.4–10.5)
Calcium: 9 mg/dL (ref 8.4–10.5)
Chloride: 104 mEq/L (ref 96–112)
Chloride: 108 mEq/L (ref 96–112)
Chloride: 108 mEq/L (ref 96–112)
Chloride: 110 mEq/L (ref 96–112)
Creatinine, Ser: 0.91 mg/dL (ref 0.4–1.2)
Creatinine, Ser: 0.93 mg/dL (ref 0.4–1.2)
Creatinine, Ser: 0.94 mg/dL (ref 0.4–1.2)
Creatinine, Ser: 0.95 mg/dL (ref 0.4–1.2)
GFR calc Af Amer: >60 mL/min (ref >60)
GFR calc Af Amer: >60 mL/min (ref >60)
GFR calc Af Amer: >60 mL/min (ref >60)
GFR calc Af Amer: >60 mL/min (ref >60)
GFR calc non Af Amer: 60 mL/min — ABNORMAL LOW (ref >60)
GFR calc non Af Amer: >60 mL/min (ref >60)
GFR calc non Af Amer: >60 mL/min (ref >60)
GFR calc non Af Amer: >60 mL/min (ref >60)
Glucose, Bld: 107 mg/dL — ABNORMAL HIGH (ref 70–99)
Glucose, Bld: 92 mg/dL (ref 70–99)
Glucose, Bld: 92 mg/dL (ref 70–99)
Glucose, Bld: 99 mg/dL (ref 70–99)
Potassium: 3.9 mEq/L (ref 3.5–5.1)
Potassium: 4.3 mEq/L (ref 3.5–5.1)
Potassium: 4.4 mEq/L (ref 3.5–5.1)
Potassium: 4.5 mEq/L (ref 3.5–5.1)
Sodium: 137 mEq/L (ref 135–145)
Sodium: 141 mEq/L (ref 135–145)
Sodium: 143 mEq/L (ref 135–145)
Sodium: 143 mEq/L (ref 135–145)
Total Bilirubin: 1.3 mg/dL — ABNORMAL HIGH (ref 0.3–1.2)
Total Bilirubin: 2.1 mg/dL — ABNORMAL HIGH (ref 0.3–1.2)
Total Bilirubin: 4.6 mg/dL — ABNORMAL HIGH (ref 0.3–1.2)
Total Bilirubin: 4.7 mg/dL — ABNORMAL HIGH (ref 0.3–1.2)
Total Protein: 5.6 g/dL — ABNORMAL LOW (ref 6.0–8.3)
Total Protein: 5.8 g/dL — ABNORMAL LOW (ref 6.0–8.3)
Total Protein: 6.4 g/dL (ref 6.0–8.3)
Total Protein: 6.5 g/dL (ref 6.0–8.3)

## 2010-06-12 LAB — CBC
HCT: 35.9 % — ABNORMAL LOW (ref 36.0–46.0)
HCT: 39.4 % (ref 36.0–46.0)
HCT: 40.3 % (ref 36.0–46.0)
Hemoglobin: 12.1 g/dL (ref 12.0–15.0)
Hemoglobin: 13 g/dL (ref 12.0–15.0)
Hemoglobin: 13.4 g/dL (ref 12.0–15.0)
MCH: 33.2 pg (ref 26.0–34.0)
MCH: 33.6 pg (ref 26.0–34.0)
MCH: 33.8 pg (ref 26.0–34.0)
MCHC: 33 g/dL (ref 30.0–36.0)
MCHC: 33.3 g/dL (ref 30.0–36.0)
MCHC: 33.8 g/dL (ref 30.0–36.0)
MCV: 100.1 fL — ABNORMAL HIGH (ref 78.0–100.0)
MCV: 100.6 fL — ABNORMAL HIGH (ref 78.0–100.0)
MCV: 101 fL — ABNORMAL HIGH (ref 78.0–100.0)
Platelets: 240 10*3/uL (ref 150–400)
Platelets: 261 10*3/uL (ref 150–400)
Platelets: 280 10*3/uL (ref 150–400)
RBC: 3.59 MIL/uL — ABNORMAL LOW (ref 3.87–5.11)
RBC: 3.91 MIL/uL (ref 3.87–5.11)
RBC: 3.99 MIL/uL (ref 3.87–5.11)
RDW: 13.4 % (ref 11.5–15.5)
RDW: 13.4 % (ref 11.5–15.5)
RDW: 13.7 % (ref 11.5–15.5)
WBC: 6.1 10*3/uL (ref 4.0–10.5)
WBC: 6.4 10*3/uL (ref 4.0–10.5)
WBC: 7.2 10*3/uL (ref 4.0–10.5)

## 2010-06-12 LAB — SURGICAL PCR SCREEN
MRSA, PCR: NEGATIVE
Staphylococcus aureus: POSITIVE — AB

## 2010-06-12 LAB — LIPASE, BLOOD: Lipase: 30 U/L (ref 11–59)

## 2010-06-13 LAB — CBC
HCT: 34 % — ABNORMAL LOW (ref 36.0–46.0)
Hemoglobin: 11.4 g/dL — ABNORMAL LOW (ref 12.0–15.0)
MCHC: 33.7 g/dL (ref 30.0–36.0)
MCV: 100.8 fL — ABNORMAL HIGH (ref 78.0–100.0)
Platelets: 266 10*3/uL (ref 150–400)
RBC: 3.37 MIL/uL — ABNORMAL LOW (ref 3.87–5.11)
RDW: 13.3 % (ref 11.5–15.5)
WBC: 9.6 10*3/uL (ref 4.0–10.5)

## 2010-06-13 LAB — COMPREHENSIVE METABOLIC PANEL
ALT: 16 U/L (ref 0–35)
AST: 21 U/L (ref 0–37)
Albumin: 3.1 g/dL — ABNORMAL LOW (ref 3.5–5.2)
Alkaline Phosphatase: 74 U/L (ref 39–117)
BUN: 20 mg/dL (ref 6–23)
CO2: 26 mEq/L (ref 19–32)
Calcium: 8.5 mg/dL (ref 8.4–10.5)
Chloride: 103 mEq/L (ref 96–112)
Creatinine, Ser: 1 mg/dL (ref 0.4–1.2)
GFR calc Af Amer: >60 mL/min (ref >60)
GFR calc non Af Amer: 56 mL/min — ABNORMAL LOW (ref >60)
Glucose, Bld: 115 mg/dL — ABNORMAL HIGH (ref 70–99)
Potassium: 3.4 mEq/L — ABNORMAL LOW (ref 3.5–5.1)
Sodium: 137 mEq/L (ref 135–145)
Total Bilirubin: 0.6 mg/dL (ref 0.3–1.2)
Total Protein: 6 g/dL (ref 6.0–8.3)

## 2010-06-13 LAB — DIFFERENTIAL
Basophils Absolute: 0 10*3/uL (ref 0.0–0.1)
Basophils Relative: 0 % (ref 0–1)
Eosinophils Absolute: 0.1 10*3/uL (ref 0.0–0.7)
Eosinophils Relative: 1 % (ref 0–5)
Lymphocytes Relative: 13 % (ref 12–46)
Lymphs Abs: 1.3 10*3/uL (ref 0.7–4.0)
Monocytes Absolute: 0.7 10*3/uL (ref 0.1–1.0)
Monocytes Relative: 8 % (ref 3–12)
Neutro Abs: 7.5 10*3/uL (ref 1.7–7.7)
Neutrophils Relative %: 78 % — ABNORMAL HIGH (ref 43–77)

## 2010-06-13 LAB — POCT I-STAT, CHEM 8
BUN: 20 mg/dL (ref 6–23)
Calcium, Ion: 1.1 mmol/L — ABNORMAL LOW (ref 1.12–1.32)
Chloride: 103 mEq/L (ref 96–112)
Creatinine, Ser: 1.1 mg/dL (ref 0.4–1.2)
Glucose, Bld: 112 mg/dL — ABNORMAL HIGH (ref 70–99)
HCT: 36 % (ref 36.0–46.0)
Hemoglobin: 12.2 g/dL (ref 12.0–15.0)
Potassium: 3.4 mEq/L — ABNORMAL LOW (ref 3.5–5.1)
Sodium: 140 mEq/L (ref 135–145)
TCO2: 26 mmol/L (ref 0–100)

## 2010-06-13 LAB — POCT CARDIAC MARKERS
CKMB, poc: <1 ng/mL — ABNORMAL LOW (ref 1.0–8.0)
Myoglobin, poc: 67.5 ng/mL (ref 12–200)
Troponin i, poc: <0.05 ng/mL (ref 0.00–0.09)

## 2010-06-13 LAB — LIPASE, BLOOD: Lipase: 37 U/L (ref 11–59)

## 2010-07-08 ENCOUNTER — Other Ambulatory Visit: Payer: Self-pay | Admitting: Oncology

## 2010-07-08 ENCOUNTER — Encounter (HOSPITAL_BASED_OUTPATIENT_CLINIC_OR_DEPARTMENT_OTHER): Payer: BC Managed Care – PPO | Admitting: Oncology

## 2010-07-08 DIAGNOSIS — Z9079 Acquired absence of other genital organ(s): Secondary | ICD-10-CM

## 2010-07-08 DIAGNOSIS — C574 Malignant neoplasm of uterine adnexa, unspecified: Secondary | ICD-10-CM

## 2010-07-08 DIAGNOSIS — Z8543 Personal history of malignant neoplasm of ovary: Secondary | ICD-10-CM

## 2010-07-08 LAB — CBC WITH DIFFERENTIAL/PLATELET
BASO%: 0.6 % (ref 0.0–2.0)
Basophils Absolute: 0 10*3/uL (ref 0.0–0.1)
EOS%: 1.5 % (ref 0.0–7.0)
Eosinophils Absolute: 0.1 10*3/uL (ref 0.0–0.5)
HCT: 37.4 % (ref 34.8–46.6)
HGB: 12.6 g/dL (ref 11.6–15.9)
LYMPH%: 26 % (ref 14.0–49.7)
MCH: 32.8 pg (ref 25.1–34.0)
MCHC: 33.6 g/dL (ref 31.5–36.0)
MCV: 97.6 fL (ref 79.5–101.0)
MONO#: 0.8 10*3/uL (ref 0.1–0.9)
MONO%: 9.5 % (ref 0.0–14.0)
NEUT#: 5 10*3/uL (ref 1.5–6.5)
NEUT%: 62.4 % (ref 38.4–76.8)
Platelets: 324 10*3/uL (ref 145–400)
RBC: 3.83 10*6/uL (ref 3.70–5.45)
RDW: 14 % (ref 11.2–14.5)
WBC: 7.9 10*3/uL (ref 3.9–10.3)
lymph#: 2.1 10*3/uL (ref 0.9–3.3)

## 2010-07-08 LAB — COMPREHENSIVE METABOLIC PANEL
ALT: 21 U/L (ref 0–35)
AST: 22 U/L (ref 0–37)
Albumin: 3.6 g/dL (ref 3.5–5.2)
Alkaline Phosphatase: 81 U/L (ref 39–117)
BUN: 15 mg/dL (ref 6–23)
CO2: 27 mEq/L (ref 19–32)
Calcium: 8.7 mg/dL (ref 8.4–10.5)
Chloride: 104 mEq/L (ref 96–112)
Creatinine, Ser: 1.05 mg/dL (ref 0.40–1.20)
Glucose, Bld: 89 mg/dL (ref 70–99)
Potassium: 3.9 mEq/L (ref 3.5–5.3)
Sodium: 139 mEq/L (ref 135–145)
Total Bilirubin: 0.7 mg/dL (ref 0.3–1.2)
Total Protein: 6.8 g/dL (ref 6.0–8.3)

## 2010-07-08 LAB — CANCER ANTIGEN 27.29: CA 27.29: 17 U/mL (ref 0–39)

## 2010-07-15 ENCOUNTER — Other Ambulatory Visit: Payer: Self-pay | Admitting: Oncology

## 2010-07-15 ENCOUNTER — Encounter (HOSPITAL_BASED_OUTPATIENT_CLINIC_OR_DEPARTMENT_OTHER): Payer: BC Managed Care – PPO | Admitting: Oncology

## 2010-07-15 DIAGNOSIS — C574 Malignant neoplasm of uterine adnexa, unspecified: Secondary | ICD-10-CM

## 2010-07-15 DIAGNOSIS — Z9079 Acquired absence of other genital organ(s): Secondary | ICD-10-CM

## 2010-07-15 DIAGNOSIS — Z8543 Personal history of malignant neoplasm of ovary: Secondary | ICD-10-CM

## 2010-07-15 LAB — CA 125: CA 125: 5.5 U/mL (ref 0.0–30.2)

## 2010-08-10 NOTE — Consult Note (Signed)
[name], [name]             ACCOUNT NO.:  [id number]   MEDICAL RECORD NO.:  [id number]          PATIENT TYPE:  OUT   LOCATION:  GYN                          FACILITY:  [facility]   PHYSICIAN:  De Blanch, M.D.DATE OF BIRTH:  1945-07-27   DATE OF CONSULTATION:  DATE OF DISCHARGE:                                 CONSULTATION   CHIEF COMPLAINT:  Ovarian cancer.   INTERVAL HISTORY:  Since her last visit, the patient has done well.  She  denies any GI or GU symptoms.  Has no pelvic pain, pressure, vaginal  bleeding or discharge.  She saw Dr. Darnelle Catalan approximately 3 months ago.  She had a follow up CA 125 value on 11/21 which was 6.7 units per mL  (previously approximately 7 units per mL).   HISTORY OF PRESENT ILLNESS:  The patient underwent surgery in February  2007 for a pelvic mass.  She was found to have a stage-IIIC ovarian  cancer which was completely debulked.  She subsequently received 6  cycles of carboplatin and Taxol chemotherapy.   PAST MEDICAL HISTORY:  Medical illnesses:  Renal stones.   PAST SURGICAL HISTORY:  1. Cesarean section.  2. Intestinal bypass in 1979 for weight reduction.  3. Ventral hernia repair.  4. Ovarian cancer debulking in 2007.   FAMILY HISTORY:  Negative for gynecologic, breast or colon cancer.   CURRENT MEDICATIONS:  1. Ambien p.r.n. sleep.  2. Multivitamins.  3. Calcium with vitamin D.   DRUG ALLERGIES:  PENICILLIN.   OBSTETRICAL HISTORY:  Gravida 2.   REVIEW OF SYSTEMS:  A 10-point comprehensive review of systems negative  except as noted above.   PHYSICAL EXAMINATION:  Weight 152 pounds.  GENERAL:  The patient is a healthy white female in no acute distress.  HEENT:  Negative.  NECK:  Supple without thyromegaly.  There is no supraclavicular or  inguinal adenopathy.  ABDOMEN:  Soft, nontender.  No mass, organomegaly, ascites or hernias  noted.  PELVIC EXAM:  EG, BUS, vagina, bladder and urethra are normal.  Cervix  and  uterus are surgically absent.  Adnexa without masses.  Rectovaginal  exam confirms.  LOWER EXTREMITIES:  Without edema or varicosities.   IMPRESSION:  Stage-IIIC optimally debulked ovarian cancer.  Clinically  free of disease with a normal CA 125.  The patient will return to see  Dr. Darnelle Catalan in 3 months and return to see [place] in 6 months.      De Blanch, M.D.  Electronically Signed     DC/MEDQ  D:  02/23/2007  T:  02/23/2007  Job:  [id number]   cc:   Telford Nab, R.N.  [address]  [place], [place] [address]   Teena Irani. Arlyce Dice, M.D.  Fax: [phone]   Excell Seltzer. Annabell Howells, M.D.  Fax: [phone]   Valentino Hue. Magrinat, M.D.  Fax: [phone]

## 2010-08-10 NOTE — Consult Note (Signed)
[name], DUN             ACCOUNT NO.:  [id number]   MEDICAL RECORD NO.:  [id number]          PATIENT TYPE:  OUT   LOCATION:  GYN                          FACILITY:  [facility]   PHYSICIAN:  De Blanch, M.D.DATE OF BIRTH:  Jun 25, 1945   DATE OF CONSULTATION:  02/29/2008  DATE OF DISCHARGE:                                 CONSULTATION   CHIEF COMPLAINT:  Ovarian cancer.   INTERVAL HISTORY:  The patient returns today for continuing followup of  her ovarian cancer.  She reports she has had no GI or GU symptoms, has  no pelvic pain, pressure, vaginal bleeding or discharge.  Functional  status is excellent.  She had a repeat CA125 on December 1, which was  6.3 units/mL.   HISTORY OF PRESENT ILLNESS:  Stage III-C ovarian cancer, undergoing  initial surgery February 2007.  She had complete debulking and then was  treated with 6 cycles of carboplatin and Taxol chemotherapy.  She has  been followed since then with no evidence of recurrent disease.   PAST MEDICAL HISTORY:  Medical illnesses:  Renal stones.   PAST SURGICAL HISTORY:  1. Cesarean section.  2. Intestinal bypass, 1979, for weight reduction.  3. Ventral hernia repair.  4. Ovarian cancer debulking, 2007.   FAMILY HISTORY:  Negative for gynecologic, breast or colon cancer.   CURRENT MEDICATIONS:  Ambien, multivitamins, Actonel, calcium with  vitamin D.   DRUG ALLERGIES:  PENICILLIN.   OBSTETRICAL HISTORY:  Gravida 2.   REVIEW OF SYSTEMS:  Ten-point comprehensive review of systems was  negative except as noted above.   PHYSICAL EXAM:  Weight 164 pounds, blood pressure 139/73, pulse 81,  respiratory rate 20.  GENERAL:  The patient is a healthy white female, in no acute distress.  HEENT:  Negative.  NECK:  Supple without thyromegaly.  There is no supraclavicular or  inguinal adenopathy.  ABDOMEN:  Soft, nontender, no mass, organomegaly, ascites or hernias  noted.  PELVIC EXAM:  EG, BUS normal.  Vagina,  bladder and urethra are normal.  Cervix and uterus are surgically absent.  Bimanual and rectovaginal exam  reveal no masses, induration or nodularity.   The patient's laboratory work is reviewed.   IMPRESSION:  Stage III-C ovarian cancer, clinically free of disease.  Patient will return to see Dr. Darnelle Catalan in six months and return to see  [place] in one year.      De Blanch, M.D.  Electronically Signed     DC/MEDQ  D:  02/29/2008  T:  02/29/2008  Job:  [id number]   cc:   Telford Nab, R.N.  [address]  [place], [place] [address]   Valentino Hue. Magrinat, M.D.  Fax: [phone]   Teena Irani. Arlyce Dice, M.D.  Fax: [phone]   Excell Seltzer. Annabell Howells, M.D.  Fax: [phone]

## 2010-08-10 NOTE — Consult Note (Signed)
NAMELAYLAMARIE, [name]             ACCOUNT NO.:  [id number]   MEDICAL RECORD NO.:  [id number]          PATIENT TYPE:  OUT   LOCATION:  GYN                          FACILITY:  [facility]   PHYSICIAN:  De Blanch, M.D.DATE OF BIRTH:  08-14-1945   DATE OF CONSULTATION:  DATE OF DISCHARGE:                                 CONSULTATION   CHIEF COMPLAINT:  Ovarian cancer.   INTERVAL HISTORY:  Since her last visit, the patient has done well.  She  denies any GI or GU symptoms, has no pelvic pain, pressure, vaginal  bleeding, or discharge.  She had a CA125 obtained on May 12th, which was  4.7 units/mL (previously 6.2 units/mL).  She sees Dr. Darnelle Catalan  alternating with my visits.  She is scheduled to have colonoscopy and  upper endoscopy for routine screen in the very near future.   HISTORY OF PRESENT ILLNESS:  Stage III C ovarian cancer undergoing  initial surgery in February 2007.  She was completely debulked and then  treated with 6 cycles of carboplatin and Taxol chemotherapy.  She has  been followed since that time with no evidence of recurrent disease.   PAST MEDICAL HISTORY/MEDICAL ILLNESSES:  Renal stones.   PAST SURGICAL HISTORY:  1. Cesarean section.  2. Intestinal bypass, 1979 for weight reduction.  3. Ventral hernia repair.  4. Ovarian cancer, debulking 2007.   FAMILY HISTORY:  Negative for gynecologic breast or colon cancer.   CURRENT MEDICATIONS:  Ambien, multivitamins, calcium with Vitamin D.   DRUG ALLERGIES:  PENICILLIN.   OBSTETRICAL HISTORY:  Gravida 2.   REVIEW OF SYSTEMS:  10-point comprehensive review of systems negative  except as noted above.   PHYSICAL EXAMINATION:  Weight 158 pounds (up 6 pounds), blood pressure  110/80, pulse 80, respiratory rate 20.  GENERAL:     The patient is a healthy white female in no acute distress.  HEENT:  Negative.  NECK:  Supple without thyromegaly.  There is no supraclavicular or  inguinal adenopathy.  ABDOMEN:   Soft, nontender.  No mass, splenomegaly, ascites, or hernias  noted.  PELVIC:  EG/BUS, vagina, bladder,  and urethra are normal.  Cervix and  uterus are surgically absent.  Adnexa without masses.  RECTOVAGINAL EXAM:  Confirms.   IMPRESSION:  Stage III C, completely debulked ovarian cancer.  Clinically free of disease.  She has a normal CA125.  She will see Dr.  Darnelle Catalan in 3 months and return to see [place] in 6 months.  At the  conjuncture, we can stretch the visits to out to 6 months intervals.      De Blanch, M.D.  Electronically Signed     DC/MEDQ  D:  08/17/2007  T:  08/17/2007  Job:  [id number]   cc:   Valentino Hue. Magrinat, M.D.  Fax: [phone]   Telford Nab, R.N.  [address]  [place], [place] [address]   Teena Irani. Arlyce Dice, M.D.  Fax: [phone]   Excell Seltzer. Annabell Howells, M.D.  Fax: [phone]

## 2010-08-10 NOTE — Consult Note (Signed)
[name], [name]             ACCOUNT NO.:  [id number]   MEDICAL RECORD NO.:  [id number]          PATIENT TYPE:  OUT   LOCATION:  GYN                          FACILITY:  [facility]   PHYSICIAN:  De Blanch, M.D.DATE OF BIRTH:  18-Mar-1946   DATE OF CONSULTATION:  DATE OF DISCHARGE:                                 CONSULTATION   CHIEF COMPLAINT:  Ovarian cancer.   INTERVAL HISTORY:  Since her last visit, the patient saw Dr. Darnelle Catalan in  February.  At that time she was doing well.  Subsequently, she has had  no new health problems.  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  She specifically denies any GI or GU symptoms, has no  pelvic pain/pressure, vaginal bleeding or discharge.  Functional status  is excellent.   HISTORY OF PRESENT ILLNESS:  In February 2007, the patient was found to  have a stage III C ovarian cancer.  She was completely debulked and  subsequently treated with adjuvant chemotherapy.  This included six  cycles of carboplatin and Taxol chemotherapy, administered by Dr. Ruthann Cancer.  She had a CA-125, which was elevated at the initiation of  chemotherapy but fell to 5.7 units/ml at the completion of chemotherapy.   HISTORY OF PRESENT ILLNESS:   MEDICAL ILLNESSES:  Renal stones.   PAST SURGICAL HISTORY:  1. Cesarean section.  2. Intestinal bypass, 1979, for weight reduction.  3. Ventral hernia repair.  4. Ovarian cancer debulking.   FAMILY HISTORY:  Negative for gynecologic, breast, or colon cancer.   CURRENT MEDICATIONS:  Ambien p.r.n. sleep, multivitamins.   DRUG ALLERGIES:  PENICILLIN.   OBSTETRICAL HISTORY:  Gravida 2.   REVIEW OF SYSTEMS:  A 10-point comprehensive review of systems is  negative except as noted above.   PHYSICAL EXAMINATION:  VITAL SIGNS:  Weight 157 pounds, blood pressure  130/70, pulse 80, respiratory rate 20.  GENERAL:  The patient is a healthy white female in no acute distress.  HEENT:   Negative.  NECK:  Supple without thyromegaly.  There is no supraclavicular or  inguinal adenopathy.  ABDOMEN:  Soft, nontender.  No mass, organomegaly, ascites, or hernias  are noted.  Midline incision is well-healed.  PELVIC:  EG/BUS, vagina, bladder, urethra are normal.  Cervix and uterus  are surgically absent.  Adnexa without masses.  Rectovaginal exam  confirms.  LOWER EXTREMITY:  Without edema or varicosities.   LABORATORY WORK:  The patient's CA-125 was recently obtained and it was  7.4 units/ml.   IMPRESSION:  Stage III C ovarian cancer.  No evidence of recurrent  disease.   The patient will return to see Dr. Darnelle Catalan in 3 months and return to  see [place] in 6 months.  We will continue to have CA-125s obtained at each  visit.      De Blanch, M.D.  Electronically Signed     DC/MEDQ  D:  08/18/2006  T:  08/18/2006  Job:  [id number]   cc:   Adolph Pollack, M.D.  [address]  [place]  [place] [address]   Onalee Hua  Logan Bores, M.D.  Fax: [phone]   Excell Seltzer. Annabell Howells, M.D.  Fax: [phone]   Telford Nab, R.N.  [address]  [place], [place] [address]   Valentino Hue. Magrinat, M.D.  Fax: [phone]

## 2010-08-13 NOTE — Op Note (Signed)
[name], APPLE             ACCOUNT NO.:  [id number]   MEDICAL RECORD NO.:  [id number]          PATIENT TYPE:  AMB   LOCATION:  [facility]                         FACILITY:  [facility]   PHYSICIAN:  Excell Seltzer. Annabell Howells, M.D.    DATE OF BIRTH:  [birth year]   DATE OF PROCEDURE:  09/13/2005  DATE OF DISCHARGE:                                 OPERATIVE REPORT   PROCEDURE:  Cystoscopy, transurethral section of bladder wall lesions.   PREOPERATIVE DIAGNOSIS:  Bladder wall lesions.   POSTOPERATIVE DIAGNOSIS:  Bladder wall lesions.   SURGEON:  Dr. Bjorn Pippin.   ANESTHESIA:  General.   SPECIMEN:  TUR specimen from left lateral wall, dome and posterior wall.   DRAIN:  20-French Foley catheter.   COMPLICATIONS:  None.   INDICATIONS:  [name] is a 65 year old white female who was initially  seen in consultation with gross hematuria.  She has history of metastatic  ovarian cancer and is currently undergoing chemotherapy.  She had a CT scan  in the office which revealed a 2.3 x 0.7 cm bladder mass on the left that  was suspicious for a primary bladder cancer.  Cystoscopy confirmed the  presence of bladder wall lesion on the left lateral wall as well as  additional erythematous lesions on the dome which are suspicious for  neoplasm versus inflammatory changes.  It was felt that cystoscopy with TUR  biopsy was indicated.   FINDINGS OF PROCEDURE:  The patient was given Cipro.  She was taken to the  operating room where general anesthetic was induced.  She was placed in  lithotomy position and a B&O suppository was placed.  Cystoscopy was  performed using a 22-French scope and 12 and  70 degrees lenses.  Examination was normal urethra.  The bladder wall had mild trabeculation on  the posterior wall and dome where erythematous areas that bled with  distension on the left lateral wall.  The bladder was a raised erythematous  lesion without any papillary features that was suspicious for  inflammation  versus neoplasm.   After thorough cystoscopic evaluation, a 26-French resectoscope sheath was  inserted.  This was fitted with a 24 loop than 12 degrees lens on an  [place] handle.  The lesion on the left lateral wall was resected.  It  measure approximately 3 cm after resection the specimen was removed and  placed in a container for pathology.  I then fulgurated around the edge of  this lesion.  Additional biopsies were obtained from the erythematous areas  on the dome and posterior wall with fulguration of the erythematous tissue  adjacent to the areas of biopsy.  Each specimen was removed separately and  will be sent separately.  After all the biopsies had been taken and the  surrounding erythematous areas fulgurated, the bladder was decompressed and  inspected.  No bleeding was noted.  The resectoscope was then removed and a  20-French 5 mL Foley catheter was inserted.  The balloon was filled with 10  mL of sterile fluid.  The patient was taken down from lithotomy position.  Her catheter was  placed to straight drainage.  Her anesthetic was reversed.  She was moved to the recovery room in stable condition.  There were no  complications.      Excell Seltzer. Annabell Howells, M.D.  Electronically Signed     JJW/MEDQ  D:  09/13/2005  T:  09/13/2005  Job:  [id number]   cc:   Valentino Hue. Magrinat, M.D.  Fax: [phone]   Teena Irani. Arlyce Dice, M.D.  Fax: [phone]

## 2010-08-13 NOTE — Consult Note (Signed)
NAMEGERRICA, [name]             ACCOUNT NO.:  [id number]   MEDICAL RECORD NO.:  [id number]          PATIENT TYPE:  OUT   LOCATION:  GYN                          FACILITY:  [facility]   PHYSICIAN:  John T. Kyla Balzarine, M.D.    DATE OF BIRTH:  [birth year]   DATE OF CONSULTATION:  05/18/2005  DATE OF DISCHARGE:  05/18/2005                                   CONSULTATION   CHIEF COMPLAINT:  Follow-up after surgical debulking of ovarian cancer.   HISTORY OF PRESENT ILLNESS:  This patient initially had  a ventral hernia  repair in early January and final pathology contained high-grade carcinoma.  The patient had adnexal lesions on CT scan and CA-125 value of 113 U/mL.  On  May 03, 2005, she underwent essentially complete debulking of stage IIIC  ovarian cancer with TAH/BSO, omentectomy and radical peritoneal debulking.  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 She has had an uncomplicated postoperative convalescence  and is regaining her strength and energy levels. She denies problems with  bowel or bladder function.   PAST MEDICAL HISTORY:  No major medical illnesses but she lost approximately  300 pounds after gastric bypass in 1979.   MEDICATIONS:  Current medications include Vicodin and Ambien p.r.n.   ALLERGIES:  PENICILLIN causes hives.   PHYSICAL EXAMINATION:  VITAL SIGNS:  Weight 138 pounds, blood pressure  130/80, pulse 80, respirations 18.  GENERAL:  The patient is anxious, alert and oriented x3 in no acute  distress. The abdominal incision is well-healed without hernia or  inflammation. No ascites or tenderness.  EXTREMITIES:  Have full strength and range of motion without edema.  PELVIC:  External genitalia and BUS are normal to inspection and palpation.  Bladder and urethra normal. Bimanual and rectovaginal examinations reveal  resolving postoperative induration of the cuff.   ASSESSMENT:  Ovarian carcinoma, stage III  optimally debulked.   PLAN:  I had long a discussion with the patient and husband and answered  multiple questions regarding anticipated treatment. She will be seeing Dr.  Darnelle Catalan on May 20, 2005 for evaluation leading to Taxol and  carboplatin chemotherapy.      John T. Kyla Balzarine, M.D.  Electronically Signed     JTS/MEDQ  D:  05/18/2005  T:  05/20/2005  Job:  [id number]   cc:   Adolph Pollack, M.D.  [address]  [place]  [place] [address]   Rema Fendt, MD   Dyane Dustman, MD   Valentino Hue. Magrinat, M.D.  Fax: [phone]   Telford Nab, R.N.  [address]  [place], [place] [address]

## 2010-08-13 NOTE — Consult Note (Signed)
[name], [name]             ACCOUNT NO.:  [id number]   MEDICAL RECORD NO.:  [id number]          PATIENT TYPE:  OUT   LOCATION:  GYN                          FACILITY:  [facility]   PHYSICIAN:  De Blanch, M.D.DATE OF BIRTH:  1945-07-28   DATE OF CONSULTATION:  DATE OF DISCHARGE:  03/08/2006                                 CONSULTATION   CHIEF COMPLAINT:  Ovarian cancer.   INTERVAL HISTORY:  Since her last visit the patient has done well. She  saw Dr. Darnelle Catalan on November 07, 2005. At that time her CA-125 was 5.7  units/mL and her exam was normal. She returns today for continuing  follow-up. She denies any GI or GU symptoms, has no pelvic pain,  pressure, vaginal bleed or discharge. Functional status is excellent.   HISTORY OF PRESENT ILLNESS:  The patient underwent initial surgery in  February, 2007. The findings of a stage III season trial ovarian cancer.  She was completely debulked and subsequently treated with adjuvant  chemotherapy from the C6 cycles of carboplatin and Taxol chemotherapy.  At the initiation of chemotherapy her  CA-125 was significantly elevated but fell rapidly and the most recent  value was 5.7 unit/mL. CT scan at completion of chemotherapy was also  negative.   HISTORY OF PRESENT ILLNESS:  MEDICAL ILLNESSES: Renal stones.   PAST SURGICAL HISTORY:  Cesarean section; intestinal bypass in 1979 for  weight reduction; ventral hernia repair; ovarian cancer debulking.   FAMILY HISTORY:  Negative for gynecologic, breast or colon cancer.   CURRENT MEDICATIONS:  1. Ambien p.r.n. sleep.  2. Multivitamins.   ALLERGIES:  PENICILLIN.   OBSTETRICAL HISTORY:  Gravida 2.   REVIEW OF SYSTEMS:  A 10 point comprehensive review of systems is  negative except as noted above.   PHYSICAL EXAMINATION:  VITAL SIGNS: Weight 153 pounds, blood pressure  128/74, pulse 80, respiratory rate 20.  GENERAL: The patient is a healthy white female in no acute distress.  HEENT: Negative.  NECK: Supple with thyromegaly.  LYMPH NODES: There is no supraclavicular or inguinal adenopathy.  ABDOMEN: Soft, nontender, no mass, organomegaly, ascites or hernias  noted. Midline incision is well-healed.  PELVIC EXAM: EGBUS, vagina, bladder, urethra are normal. Cervix and  uterus surgically absent. Adnexa without masses. Rectovaginal exam  confirms.  LOWER EXTREMITIES: Without edema or varicosities.   IMPRESSION:  Stage III C ovarian cancer. No evidence of disease. CA-125  we obtained today. The patient returns to see Dr. Darnelle Catalan in 3 months  and I will plan on seeing the patient in May of 2008.      De Blanch, M.D.  Electronically Signed     DC/MEDQ  D:  03/12/2006  T:  03/12/2006  Job:  [id number]   cc:   Adolph Pollack, M.D.  [address], [address]  [place]  [place] [address]   Teena Irani. Arlyce Dice, M.D.  Fax: [phone]   Excell Seltzer. Annabell Howells, M.D.  Fax: [phone]   Telford Nab, R.N.  [address]  [place], [place] [address]   Valentino Hue. Magrinat, M.D.  Fax: [phone]

## 2010-08-13 NOTE — Op Note (Signed)
[name], [name]             ACCOUNT NO.:  [id number]   MEDICAL RECORD NO.:  [id number]          PATIENT TYPE:  AMB   LOCATION:  DAY                          FACILITY:  [facility]   PHYSICIAN:  Adolph Pollack, M.D.DATE OF BIRTH:  1946-01-30   DATE OF PROCEDURE:  06/14/2006  DATE OF DISCHARGE:                               OPERATIVE REPORT   PREOPERATIVE DIAGNOSIS:  Malfunctioning Port-A-Cath.   POSTOPERATIVE DIAGNOSIS:  Malfunctioning Port-A-Cath.   PROCEDURE:  Port-A-Cath removal.   SURGEON:  Adolph Pollack, M.D.   ANESTHESIA:  Local (mixture of lidocaine and Marcaine) with MAC.   INDICATIONS:  This 65 year old female had been receiving chemotherapy  for ovarian cancer by way of a Port-A-Cath.  She had problem with Port-A-  Cath somewhat occlusion and clot and DVT treated with Coumadin.  Port-A-  Cath then withdrew.  She is now off her therapeutic Coumadin and  presents for Port-A-Cath removal.   TECHNIQUE:  She is seen in the holding area and brought the operating  room, placed supine on the operating table, given intravenous sedation.  Left upper chest wall sterilely prepped and draped.  Local anesthetic  was infiltrated in the Port-A-Cath region superficially and deep.  The  previous incision was reincised sharply with incision carried down to  the subcutaneous tissue.  The catheter was identified and the fibrous  sheath stripped from the catheter and the catheter was removed.  Direct  pressure was held in the left infraclavicular region for at least 10  minutes.  Using electrocautery, the catheter was then dissected free  from the chest wall and the port was dissected free from the chest wall  and removed intact.  Bleeding points were controlled with  electrocautery.  Once hemostasis was adequate, the subcutaneous tissue  was closed with a running 3-0 Vicryl suture.  The skin was closed with a  4-0 Monocryl subcuticular stitch.  Steri-Strips and sterile  dressings  were applied.   She tolerated the procedure without apparent complications and was taken  to recovery in satisfactory condition.      Adolph Pollack, M.D.  Electronically Signed     TJR/MEDQ  D:  06/14/2006  T:  06/14/2006  Job:  [id number]

## 2010-08-13 NOTE — Op Note (Signed)
[name], [name]             ACCOUNT NO.:  [id number]   MEDICAL RECORD NO.:  [id number]          PATIENT TYPE:  AMB   LOCATION:  [facility]                          FACILITY:  [facility]   PHYSICIAN:  Adolph Pollack, M.D.DATE OF BIRTH:  1945/09/24   DATE OF PROCEDURE:  06/06/2005  DATE OF DISCHARGE:                                 OPERATIVE REPORT   PREOPERATIVE DIAGNOSIS:  [name].   POSTOPERATIVE DIAGNOSIS:  [name].   PROCEDURE:  Insertion of single-lumen Port-A-Cath to the left subclavian  vein under fluoroscopic guidance.   SURGEON:  Rosenbower.   ANESTHESIA:  Local with MAC.   INDICATIONS:  This 65 year old female has [name]. She requires long-  term venous access for chemotherapy. She now presents for insertion of Port-  [employer]. Preoperatively, I have discussed the procedure and risks with her  and her husband. Risks include but not limited to bleeding, infection,  pneumothorax, DVT, malfunction.   TECHNIQUE:  She is seen holding area and brought to the operating room,  placed supine on the operating table and given intravenous sedation. A roll  was placed under shoulders. The chest wall and neck were sterilely prepped  and draped.  Local anesthetic was infiltrated the left infraclavicular  region. The left subclavian vein was then cannulated with the needle a wire  placed into the vena cava under fluoroscopic guidance. Local anesthetic was  then infiltrated inferior to the puncture wound site and an incision made in  the chest wall and using the electrocautery and blunt dissection, a pocket  was created for the port. I then made an incision around the wire.  The  catheter was passed from the inferior up to the superior incision by making  a subcutaneous tunnel. A dilator introducer complex was then threaded over  the wire. The wire and the dilator removed and the catheter was threaded  through the peel-away sheath which was then removed. Under  fluoroscopic  guidance I pulled the catheter back until its tip was at the superior vena  caval right atrial junction. The catheter was then cut near the port site  and the port threaded onto the catheter. The port withdrew blood and flushed  easily. I then anchored the port to the chest wall with 2-0 Vicryl sutures.  I placed concentrated solution in the port. Final view by way of fluoroscopy  demonstrated catheter to remain in good position.   Following this, subcutaneous tissue was closed over the port with the  running 2-0 Vicryl suture. The skin incisions were closed with 4-0 Monocryl  subcuticular stitches followed by Steri-Strips and sterile dressing.   She tolerated the procedure well without apparent complications was taken to  recovery in satisfactory condition.      Adolph Pollack, M.D.  Electronically Signed     TJR/MEDQ  D:  06/06/2005  T:  06/07/2005  Job:  [id number]

## 2010-08-13 NOTE — Op Note (Signed)
NAMEJULIE, [name]             ACCOUNT NO.:  [id number]   MEDICAL RECORD NO.:  [id number]          PATIENT TYPE:  INP   LOCATION:  1614                         FACILITY:  [facility]   PHYSICIAN:  John T. Kyla Balzarine, M.D.    DATE OF BIRTH:  April 08, 1945   DATE OF PROCEDURE:  05/03/2005  DATE OF DISCHARGE:                                 OPERATIVE REPORT   SURGEON:  Ronita Hipps, MD.   ASSISTANT:  Roseanna Rainbow, MD.   PREOPERATIVE DIAGNOSIS:  Peritoneal carcinoma associated with bilateral  ovarian cysts and elevated CA-125 values; suspect ovarian cancer.   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   PROCEDURE:  Exploratory laparotomy with total abdominal hysterectomy and  bilateral salpingo-oophorectomy, radical debulking of pelvic tumor,  appendectomy to remove metastatic ovarian cancer, omentectomy, splenectomy.   ANESTHESIA:  General endotracheal.   DESCRIPTION OF FINDINGS/INDICATIONS FOR SURGERY:  This 65 year old woman  underwent a ventral hernia repair on April 04, 2005. Mesh was placed and  the piece of adherent omentum which was apparently normal in appearance was  submitted for pathology and returned positive for papillary serous  adenocarcinoma. CT scan review showed bilateral cystic adnexal masses and CA-  125 value as 113/mL.  The patient has a remote history of intestinal bypass  procedure in 1979 for weight reduction.   At exploratory laparotomy, there was found to be a pseudocapsule around the  Prolene mesh. The omentum was adherent to the posterior abdominal parietal  peritoneum and contained multiple areas of induration, each measuring larger  than 4 cm in greatest diameter. There was no evidence of ascites when the  omental adhesions were taken down. There were multiple intestinal adhesions  throughout; the patient was status post ileal bypass with an ileo-ileal  anastomosis. The adhesions  involved essentially 90% of the small intestine;  it was felt that these adhesions would preclude good distribution for  intraperitoneal placement; therefore IP Port-A-Cath was not placed. During  the lysis of adhesions and working to free the omentum from the splenic  flexure at the transverse colon, adhesions to the capsule of the spleen  avulsed portions of the splenic capsule. We were unable to control bleeding  with simple measures and elected splenectomy. In the pelvis, bilateral  pelvic cysts measuring approximately 6 cm in greatest diameter were  encountered. The surfaces of the ovaries were relatively smooth but there  were implants obliterating the anterior and posterior cul-de-sac. The  largest implant was in the posterior cul-de-sac and measured approximately  1.5 cm. All implants were removed at the time of TAH/BSO. Additionally,  there was induration at the base of the appendix and this was suspicious for  metastatic disease. Appendectomy was elected. Small peritoneal implants on  the surface of the transverse colon and other lesions were individually  resected or ablated with electrocautery. No lymph nodes were enlarged  greater than 1 cm or suspicious to palpation; therefore retroperitoneal  lymphadenectomy was not performed.   DESCRIPTION OF PROCEDURE:  The patient was prepped and draped with an  indwelling Foley catheter and  explored through a midline incision that was  developed with a scalpel. A 4 cm x 5 cm old hematoma or seroma was found in  a pseudocapsule that encompassed the prior Marlex mesh. Initially, this was  incised as if it were fascia. Multiple adhesions between omentum and the  anterior abdominal wall and interloop adhesions of small intestine were  freed using sharp and blunt dissection with electrocautery for hemostasis.  While running bowel, an ileo-ileal bypass was encountered. There was no  generalized carcinomatosis, although small extra pelvic  implants and  relatively bulky omental disease was encountered as described above.  Omentectomy was performed, clearing the dependent omentum off of the  transverse colon and resecting a portion of the lesser omentum.  Electrocautery was used to develop pedicles which were clamped, divided and  suture ligated. All sutures and ligatures were 2-0 Vicryl unless otherwise  specified. In the course of the dissection to free an indurated portion of  omentum from the splenic flexure of the colon, it was apparent that there  was relatively brisk bleeding from the left upper quadrant. The spleen was  mobilized and several capsular tears at the site of prior serosal adhesions  were noted. I was unable to achieve hemostasis with direct electrocautery  and pressure; therefore, splenectomy was elected. The lateral peritoneal  attachments of the spleen were divided with electrocautery and the spleen  mobilized medially, lifting the tail of the pancreas. Pedicles were  developed that encompassed both the short gastric vessels and the splenic  artery and vein. These were crossclamped, divided and suture ligated, with a  double suture ligature at the splenic artery pedicle. Hemostasis was  adequate.   Additional peritoneal implants on the mesentery of the ileum were removed. A  Bookwalter self-retaining retractor was placed.   Total abdominal hysterectomy with bilateral salpingo-oophorectomy was  performed. The cornua of the uterus were grabbed with long Kelly clamps. The  round ligaments were divided with electrocautery. Starting on the right and  subsequently left sides, the posterior peritoneum was opened lateral to the  infundibulopelvic ligaments. The ureters were identified and the IP  ligaments were isolated above the ureters, crossclamped, divided and suture  ligated after free tie ligature. Because of the adherence posteriorly to the sigmoid colon, essentially a retroperitoneal approach was  elected. The  bladder flap was adherent high on the uterus, with obliteration of the  anterior cul-de-sac. The bladder peritoneum was dissected off of the bladder  and bladder subsequently mobilized off of the lower uterine segment, cervix  and upper vagina. Uterine vessels bilaterally were skeletonized  retroperitoneally with the ureters under direct vision. These were  crossclamped, divided and suture ligated. Alternating on the right and left  sides, the cardinal and uterosacral ligaments were likewise clamped adjacent  to the cervix, divided, and suture ligated. At this juncture, we entered the  anterior fornix of the vagina. Clamps were placed in an anterior to  posterior fashion, inside the final pedicles at the lateral angles of the  vagina, encompassing vaginal mucosa and residual parametrial tissue. These  pedicles were divided, suture ligated with #0 Vicryl and tagged to  facilitate cuff closure. The posterior wall of the vagina was incised and  the rectovaginal space developed. The vaginal cuff was closed with locked  running #0 Vicryl. Using sharp and blunt dissection, the posterior cul-de-  sac peritoneum and its implants were mobilized off of the proximal rectum  and distal sigmoid. Using sharp and blunt dissection with electrocautery,  all implants could be resected from the sigmoid colon. Retroperitoneal lymph  nodes were carefully palpated. There was no evidence of pathologic  lymphadenopathy and so we elected not to remove lymph nodes. There was  induration and irregularity of the serosa of the appendix as well as  thickening of the mesentery, suspicious for metastatic disease, so we  elected appendectomy. The mesosalpinx was divided and hemostatically  controlled with electrocautery. The base of the appendix was crushed,  crossclamped, divided and the stump was suture ligated with a 2-0 Vicryl  suture ligature. The abdomen and pelvis were copiously irrigated and it was   elected to resect the Prolene mesh and its surrounding pseudocapsule. This  was accomplished using Kocher clamps and we dissected back to normal  anterior rectus fascia. The abdominal wall was then closed in a running mass  closure of #0 PDS, with reapproximation of good fascia along the midline  incision. A subcutaneous Jackson-Pratt drain was placed through a stab  incision in the left lower quadrant at the base of the wound. This drain was  sutured to skin using 3-0 nylon. The skin was closed with skin clips. The  patient tolerated the procedure well and went to the recovery room in stable  condition. Estimated blood 350 mL. Transfusions none. Drains, packs, etc.,  Foley to dependent drainage; JP drain in the subcutaneous tissues. Sponge  and instrument counts correct.   PATHOLOGY SPECIMEN:  Uterus with tubes and ovaries, omentum, multiple  peritoneal biopsies. Appendix and omentum.      John T. Kyla Balzarine, M.D. Electronically Signed     JTS/MEDQ  D:  05/03/2005  T:  05/03/2005  Job:  [id number]   cc:   Roseanna Rainbow, M.D.  Fax: [phone]   Adolph Pollack, M.D.  [address]  [place]  [place] [address]   Rema Fendt, MD  [address]  [place], [place]   Dyane Dustman, MD   Telford Nab, R.N.  [address]  [place], [place] [address]

## 2010-08-13 NOTE — Consult Note (Signed)
[name], [name]             ACCOUNT NO.:  [id number]   MEDICAL RECORD NO.:  [id number]          PATIENT TYPE:  OUT   LOCATION:  GYN                          FACILITY:  [facility]   PHYSICIAN:  De Blanch, M.D.DATE OF BIRTH:  [birth year]   DATE OF CONSULTATION:  08/16/2005  DATE OF DISCHARGE:                                   CONSULTATION   CHIEF COMPLAINT:  Ovarian cancer.   INTERVAL HISTORY:  The patient returns today for a continued evaluation.  She has now received four cycles of carboplatin and Taxol chemotherapy under  the direction of Dr. Marikay Alar Magrinat.  She seems to be tolerating the  chemotherapy quite well.   HISTORY OF PRESENT ILLNESS:  Patient underwent initial surgery May 03, 2005 and was found to have a stage IIIC ovarian carcinoma.  She had  extensive disease, but was completely debulked including TAH, BSO, radical  debulking of pelvic tumor, appendectomy, omentectomy, and splenectomy.  She  received the appropriate postoperative antipneumococcal and other antiviral  immunizations.   PAST MEDICAL HISTORY:   MEDICAL ILLNESSES:  Kidney stones which passed spontaneously.   PAST SURGICAL HISTORY:  1.  Cesarean section.  2.  Intestinal bypass 1979 for weight reduction.  3.  Ventral hernia repair.   FAMILY HISTORY:  Negative for breast, colon, and ovarian cancer.   CURRENT MEDICATIONS:  Ambien p.r.n., multivitamins.   ALLERGIES:  PENICILLIN.   PAST GYN HISTORY:  Obstetrical history:  Gravida 2.  Patient had two  cesarean sections.   REVIEW OF SYSTEMS:  10-point comprehensive review of systems negative except  as noted above.   PHYSICAL EXAMINATION:  VITAL SIGNS:  Weight 131 pounds, blood pressure  120/70, pulse 70, respiratory rate 18.  GENERAL:  The patient is a healthy white female in no acute distress.  HEENT:  Negative.  NECK:  Supple without thyromegaly.  NODES:  There is no supraclavicular or inguinal adenopathy.  ABDOMEN:  Soft,  nontender.  No mass, organomegaly, ascites, or hernias are  noted.  PELVIC:  EGBUS, vagina, bladder, urethra are normal.  Cervix and uterus are  surgically absent.  Adnexa without masses.  RECTAL:  Rectovaginal examination confirms.  Stool guaiac is negative.  EXTREMITIES:  Lower extremities without edema or varicosities.   IMPRESSION:  Stage IIIC ovarian cancer clinically free of disease.  As  noted, the patient's CA-125 value on May 18 was 5.1 units/mL.  She seems to  be doing quite well.  At the end of our visit she had a nosebleed which was  controlled with tamponade.  We did check her laboratory work and four days  ago her platelet count was 271,000.  She is taking Coumadin 1 mg a day and  we have asked her to stop this for the next two weeks.  I have notified Dr.  Darnelle Catalan regarding her nosebleed should she have any further recurrences.   PLAN:  The patient will continue on the chemotherapy to complete six cycles.  Thereafter approximately a month later we will obtain a CT scan of the  chest, abdomen, and pelvis for reevaluation and if she  is clinically free of  disease place her in a surveillance program.  We will check to see whether  GOG protocol is available for consolidation therapy.      De Blanch, M.D.  Electronically Signed     DC/MEDQ  D:  08/16/2005  T:  08/17/2005  Job:  [id number]   cc:   Valentino Hue. Magrinat, M.D.  Fax: [phone]   Telford Nab, R.N.  [address]  [place], [place] [address]   Nadyne Coombes. Fontaine, M.D.  Fax: [phone]   Teena Irani. Arlyce Dice, M.D.  Fax: [phone]   Adolph Pollack, M.D.  [address]  [place]  [place] [address]

## 2010-08-13 NOTE — Consult Note (Signed)
NAMECALIOPE, [name]             ACCOUNT NO.:  [id number]   MEDICAL RECORD NO.:  [id number]          PATIENT TYPE:  OUT   LOCATION:  GYN                          FACILITY:  [facility]   PHYSICIAN:  De Blanch, M.D.DATE OF BIRTH:  12-15-1945   DATE OF CONSULTATION:  11/01/2005  DATE OF DISCHARGE:                                   CONSULTATION   CHIEF COMPLAINT:  Ovarian cancer.   INTERVAL HISTORY:  The patient has now completed six cycles of carboplatin  and Taxol chemotherapy.  She tolerated the therapy reasonably well.  She has  had normal CA-125s, the last value being 5.7 (October 31, 2005).  She recently  had a CT scan of the abdomen and pelvis that showed no evidence of  persistent disease.  The patient, herself, reports that she feels well.  She  has no GI or GU symptoms and no other significant sequelae from her  chemotherapy.   HISTORY OF PRESENT ILLNESS:  The patient underwent the surgery in February  2007 with findings of a stage IIIC ovarian cancer.  She was completely  debulked and subsequently was treated with adjuvant chemotherapy.   PAST MEDICAL HISTORY:  Medical illnesses kidney stones.   PAST SURGICAL HISTORY:  Cesarean section, intestinal bypass in 1979 for  weight reduction, ventral hernia repair, ovarian cancer debulking 2007.   FAMILY HISTORY:  Negative gynecologic, breast, or colon cancer.   CURRENT MEDICATIONS:  Ambien, multivitamins.   ALLERGIES:  PENICILLIN.   OBSTETRICAL HISTORY:  Gravida 2.   REVIEW OF SYSTEMS:  Ten point comprehensive review of systems negative  except as noted above.   PHYSICAL EXAM:  VITAL SIGNS:  Weight 138 pounds, blood pressure 110/70, pulse 80,  respiratory rate 20.  GENERAL:  The patient is a healthy white female in no acute distress.  HEENT:  Negative.  NECK:  Supple without thyromegaly, there is no supraclavicular or inguinal  adenopathy.  ABDOMEN:  The abdomen is soft, nontender.  No masses, organomegaly,  ascites  or herniae noted.  PELVIC:  EG/BUS, vagina, and urethra are normal.  Cervix and uterus  surgically absent.  Adnexa without masses.  Rectovaginal exam confirms.  EXTREMITIES:  Lower extremities without edema or varicosities.   The patient's CT scan is reviewed and appears to be normal.   IMPRESSION:  Stage IIIC ovarian cancer status post surgical debulking and  chemotherapy.  No evidence of disease.   PLAN:  We will continue to monitor the patient every three months with CA-  125 and physical exam. She will return to see Dr. Darnelle Catalan for her next  visit in three months and I will plan on seeing her in six months.   ADDENDUM:  At the end of our conversation, the patient reports that she has  apparently recently been diagnosed with a subclavian vein and jugular vein  thrombosis.  She is apparently on Arixtra and Coumadin transitioning to  Coumadin over the next few days which is being managed by Dr. Darnelle Catalan.  Currently her symptoms are that of some neck and shoulder pain on the left  as well as some left  upper extremity edema.      De Blanch, M.D.  Electronically Signed     DC/MEDQ  D:  11/01/2005  T:  11/01/2005  Job:  [id number]   cc:   Teena Irani. Arlyce Dice, M.D.  Fax: [phone]   Adolph Pollack, M.D.  [address]  [place]  [place] [address]   Telford Nab, R.N.  [phone] [address]  [place], [place] [address]   Valentino Hue. Magrinat, M.D.  Fax: [phone]

## 2010-08-13 NOTE — Discharge Summary (Signed)
NAMERAELEY, GILMORE             ACCOUNT NO.:  [id number]   MEDICAL RECORD NO.:  [id number]          PATIENT TYPE:  INP   LOCATION:  1614                         FACILITY:  [facility]   PHYSICIAN:  Roseanna Rainbow, M.D.DATE OF BIRTH:  05/17/1945   DATE OF ADMISSION:  05/03/2005  DATE OF DISCHARGE:  05/07/2005                                 DISCHARGE SUMMARY   CHIEF COMPLAINT:  Patient is a 65 year old Caucasian female for operative  management of an ovarian cancer.  Please see the dictated history and  physical as per Dr. Stanford Breed for further details.   HOSPITAL COURSE:  The patient was admitted and underwent a total abdominal  hysterectomy with bilateral salpingo-oophorectomy, omentectomy,  appendectomy, radical debulking and splenectomy.  Please see the dictated  operative summary.   On postoperative day #4, she received prophylactic vaccines.  She was  tolerating a regular diet and discharged to home.   DISCHARGE DIAGNOSES:  Ovarian serous carcinoma, stage IIIC.   CONDITION:  Stable.   DIET:  Regular.   ACTIVITY:  Pelvic rest, progressive.   MEDICATIONS:  1.  Percocet 1-2 tabs every 4-6 hours as needed.  2.  Phenergan 1 tab every 6 hours as needed.  3.  Xanax 0.25 mg 1 tab p.r.n. t.i.d.  4.  Lexapro 10 mg 1 tab daily.   DISPOSITION:  Patient is to follow up in the GYN oncology office on May 11, 2005 at 10 a.m.      Roseanna Rainbow, M.D.  Electronically Signed     LAJ/MEDQ  D:  06/16/2005  T:  06/17/2005  Job:  [id number]   cc:   Telford Nab, R.N.  [address]  [place], [place] [address]   Adolph Pollack, M.D.  [address]  [place]  [place] [address]   Dr. Rema Fendt   Dr. Dyane Dustman

## 2010-08-13 NOTE — Op Note (Signed)
[name], [name]             ACCOUNT NO.:  [id number]   MEDICAL RECORD NO.:  [id number]          PATIENT TYPE:  AMB   LOCATION:  SDS                          FACILITY:  [facility]   PHYSICIAN:  Adolph Pollack, M.D.DATE OF BIRTH:  [birth year]   DATE OF PROCEDURE:  04/04/2005  DATE OF DISCHARGE:                                 OPERATIVE REPORT   PREOP DIAGNOSIS:  Chronically incarcerated ventral incisional hernia.   POSTOPERATIVE DIAGNOSIS:  Chronically incarcerated ventral incisional  hernia.   PROCEDURE:  Repair of ventral incisional hernia with mesh.   SURGEON:  Adolph Pollack, M.D.   ANESTHESIA:  General.   INDICATIONS:  This a 65 year old female who noticed a fullness and tenting  of the skin with some discomfort back in September. Subsequently saw her at  the end of November and she had findings of what appeared to be consistent  with an incarcerated ventral hernia. On CT scan this was just some  incarcerated omentum. She has been in no acute distress since September and  now presents electively for repair. We discussed the procedure, the risks  and activity restrictions preoperatively.   TECHNIQUE:  She was seen the holding area and then brought to the operating,  placed supine on the operating table. General anesthetic was administered.  The abdominal wall was sterilely prepped and draped. Limited periumbilical  incision was made through the previous incisions and carried down to the  subcutaneous tissue. The hernia sac and chronically incarcerated contents  were noted. I carefully dissect ed the skin and umbilicus free from this. I  then identified fascia all around the defect and the hernia contents. I  entered the hernia sac carefully and resected some of the chronically  incarcerated omentum.  This left about a 2-3 cm defect in the fascia. Using  electrocautery, I raised flaps of skin and subcutaneous flaps in all  directions exposing normal fascia. I then  primarily closed the defect in a  transverse fashion with interrupted 0 Surgilon sutures. I left the sutures  long and then brought a piece of polypropylene mesh for onlay, placed it and  threaded the sutures directly up through the mesh and tied them down  directly over the primary repair. The periphery of the mesh was then  anchored to the fascia with a 3 to 4 cm overlap in all directions with  running 2-0 Prolene suture. Excess mesh was trimmed.   The wound was then irrigated. Hemostasis was adequate. The subcutaneous  tissue was closed over the mesh with a running 2-0 Vicryl suture. Local  anesthetic was infiltrated and the skin and subcutaneous tissue. Consisting  of 0.5%  plain Marcaine. The skin was then closed with running 3-0 Monocryl  subcuticular stitch followed by Steri-Strips and sterile dressing.   She tolerated the procedure without apparent complications was taken to  recovery room in satisfactory condition.      Adolph Pollack, M.D.  Electronically Signed     TJR/MEDQ  D:  04/04/2005  T:  04/05/2005  Job:  [id number]   cc:   Teena Irani. Arlyce Dice, M.D.  Fax:  [phone] 

## 2010-08-13 NOTE — Consult Note (Signed)
[name], [name]             ACCOUNT NO.:  [id number]   MEDICAL RECORD NO.:  [id number]          PATIENT TYPE:  OUT   LOCATION:  GYN                          FACILITY:  [facility]   PHYSICIAN:  De Blanch, M.D.DATE OF BIRTH:  [birth year]   DATE OF CONSULTATION:  04/26/2005  DATE OF DISCHARGE:                                   CONSULTATION   GYNECOLOGY ONCOLOGY CLINIC   CHIEF COMPLAINT:  Probable ovarian cancer.   HISTORY OF PRESENT ILLNESS:  This 65 year old white female is seen in  consultation at the request of Dr. Avel Peace regarding the management  of an apparent ovarian cancer. The patient underwent repair of a ventral  hernia on April 04, 2005.  Apparently no obvious cancer was identified,  although on final pathology both the hernia sac and omentum contained a high-  grade carcinoma with features compatible with a serous carcinoma.  Preoperatively, the patient had a CT scan which showed a 3 x 4 x 3.6-cm  umbilical hernia as well as bilateral cystic adnexal lesions measuring 3 x  3.1 x 4 on the right and 3.2 x 2.9 x 3.8 on the left.  Subsequently, the  patient has had an ultrasound showing the right ovary to measure 5.9 x 5.7 x  4.7 cm. There is at least one septation. The left ovary measures 6.6 x 5.0 x  4.4 cm.  There is no evidence of free fluid in the pelvis.  Uterus appears  normal.   The patient has had a CA-125 value which measures 113 units per mL.  She  really denies any significant GI or GU symptoms.  Denies any pelvic pain,  pressure, vaginal bleeding, or discharge.   PAST MEDICAL HISTORY:  The patient really has no medical illnesses, although  she is a kidney stones which passed spontaneously on 3 occasions.   PAST SURGICAL HISTORY:  1.  Cesarean section times.  2.  An intestinal bypass procedure in 1979 for weight reduction (the patient      weighed 300 pounds at that time). The exact extent of that surgical      procedure is not known.  3.  Ventral hernia repair.   FAMILY HISTORY:  Negative for breast, ovarian, or colon cancer.   CURRENT MEDICATIONS:  1.  Ambien 5 mg daily.  2.  Multivitamins.   DRUG ALLERGIES:  Penicillin causes hives.   PAST GYNECOLOGIC HISTORY:  Gravida 2, cesarean section x2.  The patient has  normal mammograms and Pap smears.   REVIEW OF SYSTEMS:  A 10-point comprehensive review of systems performed is  negative except as note above.   PHYSICAL EXAMINATION:  VITAL SIGNS:  Height 5 feet 3 inches.  Weight 138  pounds.  Blood pressure 130/80.  Pulse 80.  Respiratory rate 18.  GENERAL:  The patient is a healthy white female in no acute distress.  HEENT:  Negative.  NECK:  Supple without thyromegaly.  LYMPH NODES:  There is no supraclavicular or inguinal adenopathy.  ABDOMEN:  Soft, nontender. She has a healing midline upper incision above  the umbilicus. There seems like there is  some fluid collection underneath  this which is probably fluid collected above the mesh which was placed by  Dr. Abbey Chatters.  The remainder of the abdomen is normal. There is no  evidence of ascites.  PELVIC:  EG/BUS is normal. Vagina is clean. Cervix is normal.  Uterus is  anterior, normal shape, size, and consistency.  There is some fullness in  the adnexa, especially on the left and a posterior cul-de-sac mass measuring  approximately 5 cm in diameter which is mobile.  Rectovaginal exam confirms.  EXTREMITIES:  Lower extremities reveal no edema or varicosities.   IMPRESSION:  Complex pelvic mass, elevated CA-125, and papillary serous  carcinoma in the omentum and hernia sac.  It appears the patient has stage  III ovarian cancer.  Based on the CT scan, does not seem to be any bulky  disease.   I advised the patient to undergo exploratory laparotomy, total abdominal  hysterectomy, bilateral salpingo-oophorectomy, omentectomy, and debulking.  The goals of surgery were outlined.  The risks of surgery including   hemorrhage, infection, injury to adjacent viscera, thromboembolic  complications, and anesthetic risks were outlined.  All the patient's  questions were answered. They are anxious to proceed with surgery as soon as  possible. Therefore, we will schedule the patient to undergo surgery under  the direction of Dr. Jonny Ruiz T. Soper on May 03, 2005. The patient and her  husband are in agreement with this plan.      De Blanch, M.D.  Electronically Signed     DC/MEDQ  D:  04/26/2005  T:  04/27/2005  Job:  [id number]   cc:   Adolph Pollack, M.D.  [address], [address]  [place]  [place] [address]   Rema Fendt, MD  [address]  [place], [place] [address]   Dyane Dustman, MD   Telford Nab, R.N.  [address]  [place], [place] [address]

## 2010-08-13 NOTE — Op Note (Signed)
   NAME:  [name], [name]                       ACCOUNT NO.:  [id number]   MEDICAL RECORD NO.:  [id number]                   PATIENT TYPE:  AMB   LOCATION:  [facility]                                 FACILITY:  [facility]   PHYSICIAN:  Joanna Puff, M.D.            DATE OF BIRTH:  1945-09-28   DATE OF PROCEDURE:  07/04/2002  DATE OF DISCHARGE:  07/04/2002                                 OPERATIVE REPORT   PREOPERATIVE DIAGNOSIS:  Severe dermatochalasis producing visual  obstruction.   POSTOPERATIVE DIAGNOSIS:  Severe dermatochalasis producing visual  obstruction.   OPERATION:  Bilateral upper lid blepharoplasty.   SURGEON:  Joanna Puff, M.D.   ANESTHESIA:  Local with IV sedation by Ms. Mullins.   DESCRIPTION OF PROCEDURE:  With the patient in the holding areas, marks were  made for the removal of excess skin of the lids. The patient brought then  brought to the operating room and placed in the supine position. Intravenous  was begun and the patient was monitored. After intravenous sedation had been  given, the head was draped in the usual manner and the upper lids were  infiltrated with 1% Xylocaine with epinephrine.   After adequate time for the local to work well, the upper lid incision was  made and the excess skin along with [name] strip of orbicularis oculi was removed  with the Bovie cutting current. Bleeders minimally controlled with  electrocoagulation. Exploration of the medial and central fat compartments  revealed [name] moderate amount of fat. This was delivered and excised with the  Bovie cutting current. The incision in the upper lid was then closed with  continuous suture of 6-0 Ethilon. The left upper lid was repaired in [name]  similar manner.   The patient then returned to the recovery room for reactivity for prior to  being discharged home. She is scheduled to return to the office in 24 hours  for routine followup.   DISCHARGE MEDICATIONS:  Cephalexin 250, 1 q.i.d.  1. Sterapred 5 mg Dosepak.  2. Vicodin 7.5 mg one tablet q. 3-4h p.r.n. pain.   CONDITION ON DISCHARGE:  Good.   COMPLICATIONS:  None.                                               Joanna Puff, M.D.    LLP/MEDQ  D:  08/09/2002  T:  08/09/2002  Job:  [id number]

## 2010-10-15 ENCOUNTER — Other Ambulatory Visit: Payer: Self-pay | Admitting: Family Medicine

## 2010-10-15 DIAGNOSIS — Z1231 Encounter for screening mammogram for malignant neoplasm of breast: Secondary | ICD-10-CM

## 2010-11-17 ENCOUNTER — Ambulatory Visit
Admission: RE | Admit: 2010-11-17 | Discharge: 2010-11-17 | Disposition: A | Discharge status: Home / Self Care | Payer: BC Managed Care – PPO | Source: Ambulatory Visit | Attending: Family Medicine | Admitting: Family Medicine

## 2010-11-17 DIAGNOSIS — Z1231 Encounter for screening mammogram for malignant neoplasm of breast: Secondary | ICD-10-CM

## 2011-01-03 ENCOUNTER — Encounter (HOSPITAL_BASED_OUTPATIENT_CLINIC_OR_DEPARTMENT_OTHER): Payer: BC Managed Care – PPO | Admitting: Oncology

## 2011-01-03 ENCOUNTER — Other Ambulatory Visit: Payer: Self-pay | Admitting: Oncology

## 2011-01-03 DIAGNOSIS — C569 Malignant neoplasm of unspecified ovary: Secondary | ICD-10-CM

## 2011-01-03 DIAGNOSIS — Z8543 Personal history of malignant neoplasm of ovary: Secondary | ICD-10-CM

## 2011-01-03 DIAGNOSIS — Z9079 Acquired absence of other genital organ(s): Secondary | ICD-10-CM

## 2011-01-03 LAB — CA 125: CA 125: 6.2 U/mL (ref 0.0–30.2)

## 2011-01-05 ENCOUNTER — Ambulatory Visit: Payer: BC Managed Care – PPO | Attending: Gynecology | Admitting: Gynecology

## 2011-01-05 DIAGNOSIS — Z87442 Personal history of urinary calculi: Secondary | ICD-10-CM | POA: Insufficient documentation

## 2011-01-05 DIAGNOSIS — Z980 Intestinal bypass and anastomosis status: Secondary | ICD-10-CM | POA: Insufficient documentation

## 2011-01-05 DIAGNOSIS — C569 Malignant neoplasm of unspecified ovary: Secondary | ICD-10-CM | POA: Insufficient documentation

## 2011-01-05 DIAGNOSIS — Z9221 Personal history of antineoplastic chemotherapy: Secondary | ICD-10-CM | POA: Insufficient documentation

## 2011-01-05 DIAGNOSIS — M129 Arthropathy, unspecified: Secondary | ICD-10-CM | POA: Insufficient documentation

## 2011-01-07 NOTE — Consult Note (Signed)
  [name], [name]             ACCOUNT NO.:  [id number]  MEDICAL RECORD NO.:  [id number]  LOCATION:  GYN                          FACILITY:  [facility]  PHYSICIAN:  De Blanch, M.D.DATE OF BIRTH:  December 30, 1945  DATE OF CONSULTATION:  01/05/2011 DATE OF DISCHARGE:                                CONSULTATION   CHIEF COMPLAINT:  Ovarian cancer.  INTERVAL HISTORY:  The patient returns today for continuing followup, now slightly over 5 years since her initial surgery and diagnosis of a stage IIIC ovarian cancer.  Since her last visit, she has done well. She denies any GI or GU symptoms.  Has no pelvic pain, pressure, vaginal bleeding, or discharge.  She has recently entered a weight loss program coordinated by [employer].  HISTORY OF PRESENT ILLNESS:  Stage IIIC ovarian cancer initially diagnosed in February 2007.  After complete surgical debulking, she then was treated with 6 cycles of carboplatin and Taxol chemotherapy. Subsequently, she had been followed with no evidence of recurrent disease.  CA-125 yesterday was 6.2.  PAST MEDICAL HISTORY: 1. Medical illnesses. 2. Arthritis. 3. Cholecystitis (resolved). 4. Kidney stones.  PAST SURGICAL HISTORY:  Cesarean section; intestinal bypass surgery, 1979 for weight reduction; ventral hernia repair; ovarian cancer debulking, 2007; laparoscopic cholecystectomy, 2011.  FAMILY HISTORY:  Negative for gynecologic, breast, or colon cancer.  CURRENT MEDICATIONS:  Ambien, multivitamins, calcium, vitamin D, Actonel.  DRUG ALLERGIES:  PENICILLIN.  OBSTETRICAL HISTORY:  Gravida 2.  REVIEW OF SYSTEMS:  Ten-point comprehensive review of systems negative except as noted above.  SOCIAL HISTORY:  The patient is married.  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  She does not smoke.  PHYSICAL EXAM:  VITAL SIGNS:  Weight 170 pounds, blood pressure 110/70. GENERAL:  The patient is a healthy  white female, no acute distress. HEENT:  Negative. NECK:  Supple without thyromegaly.  There is no supraclavicular or inguinal adenopathy. ABDOMEN:  Soft, nontender.  No mass, organomegaly, ascites, or hernias are noted. PELVIC EXAM:  EG/BUS, vagina, bladder, urethra are normal.  Cervix and uterus are surgically absent.  Adnexa without masses.  rectovaginal exam confirms. EXTREMITIES:  Lower extremities are without edema or varicosities.  IMPRESSION:  Stage IIIC ovarian cancer, 2007.  The patient remains in remission, now with over 5 years of followup.  At this juncture, we will release her from our practice and suggested she see Dr. Antionette Char for annual gynecologic exam.     De Blanch, M.D.     DC/MEDQ  D:  01/05/2011  T:  01/05/2011  Job:  [id number]  cc:   Telford Nab, R.N. [address] [place], [place] [address]  Roseanna Rainbow, M.D. Fax: [phone]  Evelena Peat, M.D.  Adolph Pollack, M.D. [address], [address] [place] [address]  Lowella Dell, M.D. Fax: [phone]  Electronically Signed by De Blanch M.D. on 01/07/2011 11:14:26 AM

## 2011-04-20 ENCOUNTER — Telehealth: Payer: Self-pay | Admitting: Oncology

## 2011-04-20 NOTE — Telephone Encounter (Signed)
Pt called inquiring about her yearly April appts advised the pt that did not have any orders would send a message to dr Darnelle Catalan for an update.

## 2011-04-22 ENCOUNTER — Encounter: Payer: Self-pay | Admitting: Family Medicine

## 2011-04-22 ENCOUNTER — Ambulatory Visit (INDEPENDENT_AMBULATORY_CARE_PROVIDER_SITE_OTHER): Payer: BC Managed Care – PPO | Admitting: Family Medicine

## 2011-04-22 VITALS — BP 100/62 | Temp 98.3°F | Wt 183.0 lb

## 2011-04-22 DIAGNOSIS — J329 Chronic sinusitis, unspecified: Secondary | ICD-10-CM

## 2011-04-22 MED ORDER — LEVOFLOXACIN 500 MG PO TABS: 500.0000 mg | ORAL_TABLET | Freq: Every day | ORAL | Status: AC

## 2011-04-22 MED ORDER — ZOLPIDEM TARTRATE 10 MG PO TABS: 10.0000 mg | ORAL_TABLET | Freq: Every day | ORAL | Status: DC

## 2011-04-22 NOTE — Progress Notes (Signed)
  Subjective:    Patient ID: [name], female    DOB: 05/26/1945, 66 y.o.   MRN: [id number]  HPI  Patient seen with possible sinus infection. She has right maxillary facial pain possibly some mild swelling over the past 5 days or so. No fever. Minimal nasal congestion. Denies any cough or sore throat. She has history of allergy to penicillin. Previous intolerance of clindamycin with diarrhea. Patient also needs refills of Ambien. She has recently been released from care of oncologist. Ovarian cancer back in 2007   Review of Systems  Constitutional: Negative for fever and chills.  HENT: Positive for congestion. Negative for ear pain and facial swelling.   Respiratory: Negative for cough.   Neurological: Negative for headaches.  Hematological: Negative for adenopathy.       Objective:   Physical Exam  Constitutional: She appears well-developed and well-nourished.  HENT:  Right Ear: External ear normal.  Left Ear: External ear normal.       Patient has some gum erythema right upper first molar  Cardiovascular: Normal rate and regular rhythm.   Pulmonary/Chest: Effort normal and breath sounds normal. No respiratory distress. She has no wheezes. She has no rales.          Assessment & Plan:  Right facial pain. Acute sinusitis versus early gum abscess. Intolerant to penicillin and clindamycin. Levaquin 500 mg once daily for 10 days

## 2011-05-02 ENCOUNTER — Other Ambulatory Visit: Payer: Self-pay | Admitting: Oncology

## 2011-05-02 ENCOUNTER — Telehealth: Payer: Self-pay | Admitting: Oncology

## 2011-05-02 DIAGNOSIS — C569 Malignant neoplasm of unspecified ovary: Secondary | ICD-10-CM

## 2011-05-02 NOTE — Telephone Encounter (Signed)
S/w the pt and she is aware of her April 2013 appts °

## 2011-07-04 ENCOUNTER — Other Ambulatory Visit: Payer: BC Managed Care – PPO | Admitting: Lab

## 2011-07-05 ENCOUNTER — Ambulatory Visit (HOSPITAL_BASED_OUTPATIENT_CLINIC_OR_DEPARTMENT_OTHER): Payer: BC Managed Care – PPO | Admitting: Lab

## 2011-07-05 DIAGNOSIS — C569 Malignant neoplasm of unspecified ovary: Secondary | ICD-10-CM

## 2011-07-05 LAB — COMPREHENSIVE METABOLIC PANEL
ALT: 17 U/L (ref 0–35)
AST: 25 U/L (ref 0–37)
Albumin: 3.9 g/dL (ref 3.5–5.2)
Alkaline Phosphatase: 85 U/L (ref 39–117)
BUN: 22 mg/dL (ref 6–23)
CO2: 23 mEq/L (ref 19–32)
Calcium: 9.1 mg/dL (ref 8.4–10.5)
Chloride: 105 mEq/L (ref 96–112)
Creatinine, Ser: 0.99 mg/dL (ref 0.50–1.10)
Glucose, Bld: 105 mg/dL — ABNORMAL HIGH (ref 70–99)
Potassium: 4 mEq/L (ref 3.5–5.3)
Sodium: 143 mEq/L (ref 135–145)
Total Bilirubin: 0.5 mg/dL (ref 0.3–1.2)
Total Protein: 6.5 g/dL (ref 6.0–8.3)

## 2011-07-05 LAB — CA 125: CA 125: 5.4 U/mL (ref 0.0–30.2)

## 2011-07-05 LAB — CBC WITH DIFFERENTIAL/PLATELET
BASO%: 0.6 % (ref 0.0–2.0)
Basophils Absolute: 0.1 10*3/uL (ref 0.0–0.1)
EOS%: 1.8 % (ref 0.0–7.0)
Eosinophils Absolute: 0.2 10*3/uL (ref 0.0–0.5)
HCT: 39.7 % (ref 34.8–46.6)
HGB: 13.3 g/dL (ref 11.6–15.9)
LYMPH%: 20.7 % (ref 14.0–49.7)
MCH: 32.8 pg (ref 25.1–34.0)
MCHC: 33.6 g/dL (ref 31.5–36.0)
MCV: 97.7 fL (ref 79.5–101.0)
MONO#: 1 10*3/uL — ABNORMAL HIGH (ref 0.1–0.9)
MONO%: 9.3 % (ref 0.0–14.0)
NEUT#: 7 10*3/uL — ABNORMAL HIGH (ref 1.5–6.5)
NEUT%: 67.6 % (ref 38.4–76.8)
Platelets: 311 10*3/uL (ref 145–400)
RBC: 4.06 10*6/uL (ref 3.70–5.45)
RDW: 13.1 % (ref 11.2–14.5)
WBC: 10.3 10*3/uL (ref 3.9–10.3)
lymph#: 2.1 10*3/uL (ref 0.9–3.3)
nRBC: 0 % (ref 0–0)

## 2011-07-11 ENCOUNTER — Ambulatory Visit (HOSPITAL_BASED_OUTPATIENT_CLINIC_OR_DEPARTMENT_OTHER): Payer: BC Managed Care – PPO | Admitting: Oncology

## 2011-07-11 VITALS — BP 116/79 | HR 81 | Temp 98.6°F | Ht 61.5 in | Wt 181.1 lb

## 2011-07-11 DIAGNOSIS — Z8543 Personal history of malignant neoplasm of ovary: Secondary | ICD-10-CM

## 2011-07-11 NOTE — Progress Notes (Signed)
ID: [name]   DOB: 1945-09-10  MR#: [id number]  WUJ#:[id number]  H ISTORY OF PRESENT ILLNESS:  The patient had a somewhat prominent umbilicus for some time, but as she lost weight on a diet this became more prominent. She brought it to Dr. Maris Berger attention and she underwent ventral hernia repair April 04, 2005. Unfortunately, the pathology ([id number]) showed high-grade carcinoma with features compatible with serous carcinoma. The tumor was ER weakly positive and PR negative. The patient had a prior CT scan of the abdomen and pelvis in November of 2006, which showed bilateral cystic lesions in the pelvis and a transabdominal and transvaginal ultrasound April 15, 2005, confirmed that while the uterus was normal, both ovaries were abnormal with complicated cystic lesions; left greater than right. The patient was then referred to Ronita Hipps and a CA 125 of 113 was documented. After appropriate discussion, the patient underwent total abdominal hysterectomy with bilateral salpingo-oophorectomy, omentectomy, incidental splenectomy (secondary to tears), and appendectomy May 03, 2005. The final pathology ([id number]) confirmed a T3c, NX, MX, high-grade serous carcinoma involving both ovaries, the omentum, and some bowel adhesions. The spleen and appendix were negative.  Her subsequent history is as detailed below  INTERVAL HISTORY: Aizah returns today for followup of her remote ovarian cancer. Interval history significant for having retired. She is doing a lot of housecleaning, enjoying family, and cooking. She is not however on a regular exercise program yet.  REVIEW OF SYSTEMS: Is, visual changes, cough cough, shortness of breath, pleurisy, change in bowel or bladder habits, unusual pain, fever, rash, bleeding, or unexplained weight loss or fatigue. A detailed review of systems was entirely benign.  PAST MEDICAL HISTORY: Past Medical History  Diagnosis Date  . VIRAL URI 03/06/2009  .  Cervicalgia 05/19/2009  . WEIGHT GAIN 06/30/2008  . Headache 05/19/2009  . SPRAIN AND STRAIN OF UNSPECIFIED SITE OF HAND 05/19/2009  . NEOPLASM, MALIGNANT, OVARY, HX OF 06/30/2008  1. Nephrolithiasis followed by Larey Dresser.  2.  Osteopenia.  3.  Remote intestinal bypass surgery for weight control.  4.  Cesarean section x2.  5.  Status post blepharoplasty.  6.  Ventral hernia repair earlier this year.   PAST SURGICAL HISTORY: Past Surgical History  Procedure Date  . Appendectomy   . Abdominal hysterectomy   . Ovarian cancer 2007  . Cesarean section   . Splenectomy 2007  . Gastric bypass 1979  . Transurethral resection of bladder 2007    LESION  . Renal calculi     FAMILY HISTORY Family History  Problem Relation Age of Onset  . Arthritis Other   The patient's father died at the age of 53 from lymphoma. The patient's father died of "old age" at 61. The patient had 3 brothers, one of whom died with hepatitis and she has 1 sister. There is no history of ovarian or breast cancer in the family.   GYNECOLOGIC HISTORY: She is GX P2. Last menstrual period about 6 years ago. She never took hormone replacement therapy. Is not having any problems with hot flashes at present.   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 Her husband, Jillyn Hidden, present today, is Production designer, theatre/television/film of [employer] and Engineer, maintenance (IT) at [employer]. Their 2 children are Marylene Land, 30, who teaches elementary school in [place] and is currently pregnant with their first grandchild (I believe it is going to be a granddaughter) and their son, Judie Grieve, 53,  is working at [employer] and [employer]. He really enjoys the outdoors. The patient is a member of the [employer].    ADVANCED DIRECTIVES:  HEALTH MAINTENANCE: History  Substance Use Topics  . Smoking status: Never Smoker   . Smokeless tobacco: Not on file  . Alcohol Use: Not on file     Colonoscopy: 2011  PAP:  UTD  Bone density: remote  Lipid panel: due  Allergies  Allergen Reactions  . Penicillins     Current Outpatient Prescriptions  Medication Sig Dispense Refill  . Calcium Carbonate-Vitamin D (CALCIUM + D PO) Take by mouth daily. Total of 1800 units daily      . Multiple Vitamin (MULTIVITAMIN) tablet Take 1 tablet by mouth daily.      . Omega-3 Fatty Acids (FISH OIL PO) Take 1,500 Units by mouth daily.      . vitamin E 400 UNIT capsule Take 400 Units by mouth daily.      Marland Kitchen zolpidem (AMBIEN) 10 MG tablet Take 1 tablet (10 mg total) by mouth at bedtime.  30 tablet  5    OBJECTIVE: Middle-aged white woman in no acute distress Filed Vitals:   07/11/11 1404  BP: 116/79  Pulse: 81  Temp: 98.6 F (37 C)     Body mass index is 33.66 kg/(m^2).    ECOG FS: 0  Sclerae unicteric Oropharynx clear No peripheral adenopathy Lungs no rales or rhonchi Heart regular rate and rhythm Abd benign, no masses palpated, positive bowel sounds, no tenderness  MSK no focal spinal tenderness, no peripheral edema Neuro: nonfocal Breasts: Deferred   LAB RESULTS: Lab Results  Component Value Date   WBC 10.3 07/05/2011   NEUTROABS 7.0* 07/05/2011   HGB 13.3 07/05/2011   HCT 39.7 07/05/2011   MCV 97.7 07/05/2011   PLT 311 07/05/2011      Chemistry      Component Value Date/Time   NA 143 07/05/2011 0820   K 4.0 07/05/2011 0820   CL 105 07/05/2011 0820   CO2 23 07/05/2011 0820   BUN 22 07/05/2011 0820   CREATININE 0.99 07/05/2011 0820      Component Value Date/Time   CALCIUM 9.1 07/05/2011 0820   ALKPHOS 85 07/05/2011 0820   AST 25 07/05/2011 0820   ALT 17 07/05/2011 0820   BILITOT 0.5 07/05/2011 0820       Lab Results  Component Value Date   LABCA2 17 07/08/2010    No components found with this basename: [id number]    No results found for this basename: INR:1;PROTIME:1 in the last 168 hours  Urinalysis    Component Value Date/Time   LABSPEC 1.030 09/02/2005 0839    STUDIES: No  new results found.Most recent  mammogram August of 2012   ASSESSMENT:  66 year old woman status post TAH/BSO /omentectomy/splenectomy/appendectomy February 2007 for a stage IIIC high grade ovarian carcinoma, with a preoperative CA-125 of 113.  Adjuvantly she received carboplatin and Taxol times six completed in July 2007. She is s/p incidental splenectomy at the time of her debulking surgery.  PLAN: At this point I feel comfortable releasing her from followup here. She will followup as far as GYN issues are concerned with Dr. Tamela Oddi. Health maintenance will be through Dr. Caryl Never. I will be glad to see Clotile on an as-needed basis, but as of now no further appointments have been made for her here.   Shaliah Wann C    07/11/2011

## 2011-08-01 ENCOUNTER — Telehealth: Payer: Self-pay | Admitting: *Deleted

## 2011-08-01 NOTE — Telephone Encounter (Signed)
Error

## 2011-10-11 ENCOUNTER — Other Ambulatory Visit: Payer: Self-pay | Admitting: Family Medicine

## 2011-10-11 DIAGNOSIS — Z1231 Encounter for screening mammogram for malignant neoplasm of breast: Secondary | ICD-10-CM

## 2011-10-29 LAB — HM MAMMOGRAPHY: HM Mammogram: NEGATIVE

## 2011-11-18 ENCOUNTER — Ambulatory Visit
Admission: RE | Admit: 2011-11-18 | Discharge: 2011-11-18 | Disposition: A | Discharge status: Home / Self Care | Payer: BC Managed Care – PPO | Source: Ambulatory Visit | Attending: Family Medicine | Admitting: Family Medicine

## 2011-11-18 DIAGNOSIS — Z1231 Encounter for screening mammogram for malignant neoplasm of breast: Secondary | ICD-10-CM

## 2011-12-23 ENCOUNTER — Other Ambulatory Visit (INDEPENDENT_AMBULATORY_CARE_PROVIDER_SITE_OTHER): Payer: BC Managed Care – PPO

## 2011-12-23 DIAGNOSIS — Z0000 Encounter for general adult medical examination without abnormal findings: Secondary | ICD-10-CM

## 2011-12-23 DIAGNOSIS — Z8543 Personal history of malignant neoplasm of ovary: Secondary | ICD-10-CM

## 2011-12-23 LAB — BASIC METABOLIC PANEL
BUN: 22 mg/dL (ref 6–23)
CO2: 25 mEq/L (ref 19–32)
Calcium: 9 mg/dL (ref 8.4–10.5)
Chloride: 106 mEq/L (ref 96–112)
Creatinine, Ser: 1 mg/dL (ref 0.4–1.2)
GFR: 62.85 mL/min (ref >60.00)
Glucose, Bld: 88 mg/dL (ref 70–99)
Potassium: 4.2 mEq/L (ref 3.5–5.1)
Sodium: 140 mEq/L (ref 135–145)

## 2011-12-23 LAB — POCT URINALYSIS DIPSTICK
Bilirubin, UA: NEGATIVE
Blood, UA: NEGATIVE
Glucose, UA: NEGATIVE
Leukocytes, UA: NEGATIVE
Nitrite, UA: NEGATIVE
Protein, UA: NEGATIVE
Spec Grav, UA: 1.02
Urobilinogen, UA: 0.2
pH, UA: 5.5

## 2011-12-23 LAB — HEPATIC FUNCTION PANEL
ALT: 19 U/L (ref 0–35)
AST: 23 U/L (ref 0–37)
Albumin: 3.8 g/dL (ref 3.5–5.2)
Alkaline Phosphatase: 79 U/L (ref 39–117)
Bilirubin, Direct: 0.2 mg/dL (ref 0.0–0.3)
Total Bilirubin: 0.7 mg/dL (ref 0.3–1.2)
Total Protein: 7.1 g/dL (ref 6.0–8.3)

## 2011-12-23 LAB — CBC WITH DIFFERENTIAL/PLATELET
Basophils Absolute: 0 10*3/uL (ref 0.0–0.1)
Basophils Relative: 0.6 % (ref 0.0–3.0)
Eosinophils Absolute: 0.1 10*3/uL (ref 0.0–0.7)
Eosinophils Relative: 1.5 % (ref 0.0–5.0)
HCT: 41 % (ref 36.0–46.0)
Hemoglobin: 13.1 g/dL (ref 12.0–15.0)
Lymphocytes Relative: 27.5 % (ref 12.0–46.0)
Lymphs Abs: 2.1 10*3/uL (ref 0.7–4.0)
MCHC: 32 g/dL (ref 30.0–36.0)
MCV: 98.3 fl (ref 78.0–100.0)
Monocytes Absolute: 0.7 10*3/uL (ref 0.1–1.0)
Monocytes Relative: 9.5 % (ref 3.0–12.0)
Neutro Abs: 4.7 10*3/uL (ref 1.4–7.7)
Neutrophils Relative %: 60.9 % (ref 43.0–77.0)
Platelets: 323 10*3/uL (ref 150.0–400.0)
RBC: 4.17 Mil/uL (ref 3.87–5.11)
RDW: 13.4 % (ref 11.5–14.6)
WBC: 7.7 10*3/uL (ref 4.5–10.5)

## 2011-12-23 LAB — LIPID PANEL
Cholesterol: 148 mg/dL (ref 0–200)
HDL: 54.7 mg/dL (ref >39.00)
LDL Cholesterol: 83 mg/dL (ref 0–99)
Total CHOL/HDL Ratio: 3
Triglycerides: 54 mg/dL (ref 0.0–149.0)
VLDL: 10.8 mg/dL (ref 0.0–40.0)

## 2011-12-23 LAB — TSH: TSH: 1.84 u[IU]/mL (ref 0.35–5.50)

## 2011-12-24 LAB — CA 125: CA 125: 4.8 U/mL (ref 0.0–30.2)

## 2011-12-29 ENCOUNTER — Ambulatory Visit (INDEPENDENT_AMBULATORY_CARE_PROVIDER_SITE_OTHER): Payer: BC Managed Care – PPO | Admitting: Family Medicine

## 2011-12-29 ENCOUNTER — Encounter: Payer: Self-pay | Admitting: Family Medicine

## 2011-12-29 VITALS — BP 132/72 | HR 72 | Temp 98.5°F | Resp 12 | Ht 61.25 in | Wt 183.0 lb

## 2011-12-29 DIAGNOSIS — Z0000 Encounter for general adult medical examination without abnormal findings: Secondary | ICD-10-CM

## 2011-12-29 DIAGNOSIS — F5104 Psychophysiologic insomnia: Secondary | ICD-10-CM

## 2011-12-29 DIAGNOSIS — Z23 Encounter for immunization: Secondary | ICD-10-CM

## 2011-12-29 DIAGNOSIS — G4700 Insomnia, unspecified: Secondary | ICD-10-CM

## 2011-12-29 HISTORY — DX: Psychophysiologic insomnia: F51.04

## 2011-12-29 MED ORDER — ZOLPIDEM TARTRATE 10 MG PO TABS: 10.0000 mg | ORAL_TABLET | Freq: Every day | ORAL | Status: DC

## 2011-12-29 NOTE — Patient Instructions (Addendum)
Continue regular calcium and vitamin D supplementation Continue weight bearing exercise such as walking Check on insurance coverage for shingles vaccine Discuss DEXA scan with gynecologist You'll need repeat Pneumovax by next year Continue yearly mammogram

## 2011-12-29 NOTE — Progress Notes (Signed)
  Subjective:    Patient ID: [name], female    DOB: [birth year], 66 y.o.   MRN: [id number]  HPI  Complete physical. Patient has remote history of ovarian cancer almost 7 years ago. She's been released by surgeon and oncologist. She has scheduled followup gynecologist next week. Tetanus 2007. Mammogram in August of this year. Colonoscopy 2009. Last DEXA scan 2009 and apparently showed osteoporosis with T score -2.6 hip. No followup since then. Needs flu vaccine. No history of shingles vaccine. She's had one prior Pneumovax and will need booster at age 59.  Chronic insomnia. Uses Ambien and has been on this for several years. She's tried multiple times without this without success. No other prescription medications. She started walking for exercise.  Past Medical History  Diagnosis Date  . VIRAL URI 03/06/2009  . Cervicalgia 05/19/2009  . WEIGHT GAIN 06/30/2008  . Headache 05/19/2009  . SPRAIN AND STRAIN OF UNSPECIFIED SITE OF HAND 05/19/2009  . NEOPLASM, MALIGNANT, OVARY, HX OF 06/30/2008   Past Surgical History  Procedure Date  . Appendectomy   . Abdominal hysterectomy   . Ovarian cancer 2007  . Cesarean section   . Splenectomy 2007  . Gastric bypass 1979  . Transurethral resection of bladder 2007    LESION  . Renal calculi     reports that she has never smoked. She does not have any smokeless tobacco history on file. Her alcohol and drug histories not on file. family history includes Arthritis in her other. Allergies  Allergen Reactions  . Penicillins       Review of Systems  Constitutional: Negative for fever, activity change, appetite change and fatigue.  HENT: Negative for hearing loss, ear pain, sore throat and trouble swallowing.   Eyes: Negative for visual disturbance.  Respiratory: Negative for cough and shortness of breath.   Cardiovascular: Negative for chest pain and palpitations.  Gastrointestinal: Negative for abdominal pain, diarrhea, constipation and blood  in stool.  Genitourinary: Negative for dysuria and hematuria.  Musculoskeletal: Negative for myalgias, back pain and arthralgias.  Skin: Negative for rash.  Neurological: Negative for dizziness, syncope and headaches.  Hematological: Negative for adenopathy.  Psychiatric/Behavioral: Negative for confusion and dysphoric mood.       Objective:   Physical Exam  Constitutional: She is oriented to person, place, and time. She appears well-developed and well-nourished.  HENT:  Right Ear: External ear normal.  Left Ear: External ear normal.  Mouth/Throat: Oropharynx is clear and moist.  Eyes: Pupils are equal, round, and reactive to light.  Neck: Neck supple. No thyromegaly present.  Cardiovascular: Normal rate and regular rhythm.   No murmur heard. Pulmonary/Chest: Effort normal and breath sounds normal. No respiratory distress. She has no wheezes. She has no rales.  Abdominal: Soft. She exhibits no mass. There is no tenderness. There is no guarding.  Musculoskeletal: She exhibits no edema.  Lymphadenopathy:    She has no cervical adenopathy.  Neurological: She is alert and oriented to person, place, and time. No cranial nerve deficit.  Skin: No rash noted.  Psychiatric: She has a normal mood and affect. Her behavior is normal.          Assessment & Plan:  Health maintenance. Flu vaccine given. She will check on coverage for shingles vaccine. Consider repeat DEXA scan. She plans to check with her gynecologist. Continue yearly mammogram. Pneumovax next year.  Continue with regular exercise.

## 2012-01-26 ENCOUNTER — Ambulatory Visit: Payer: BC Managed Care – PPO | Admitting: Family Medicine

## 2012-01-26 DIAGNOSIS — Z299 Encounter for prophylactic measures, unspecified: Secondary | ICD-10-CM

## 2012-01-26 MED ORDER — ZOSTER VACCINE LIVE 19400 UNT/0.65ML ~~LOC~~ SOLR: 0.6500 mL | Freq: Once | SUBCUTANEOUS | Status: DC

## 2012-05-14 ENCOUNTER — Telehealth: Payer: Self-pay | Admitting: *Deleted

## 2012-05-16 NOTE — Telephone Encounter (Signed)
error 

## 2012-06-11 ENCOUNTER — Encounter (HOSPITAL_COMMUNITY): Payer: Self-pay

## 2012-06-11 ENCOUNTER — Emergency Department (HOSPITAL_COMMUNITY)
Admission: EM | Admit: 2012-06-11 | Discharge: 2012-06-11 | Disposition: A | Discharge status: Home / Self Care | Payer: BC Managed Care – PPO | Attending: Emergency Medicine | Admitting: Emergency Medicine

## 2012-06-11 ENCOUNTER — Emergency Department (HOSPITAL_COMMUNITY): Payer: BC Managed Care – PPO

## 2012-06-11 DIAGNOSIS — Z8543 Personal history of malignant neoplasm of ovary: Secondary | ICD-10-CM | POA: Insufficient documentation

## 2012-06-11 DIAGNOSIS — R112 Nausea with vomiting, unspecified: Secondary | ICD-10-CM | POA: Insufficient documentation

## 2012-06-11 DIAGNOSIS — Z90710 Acquired absence of both cervix and uterus: Secondary | ICD-10-CM | POA: Insufficient documentation

## 2012-06-11 DIAGNOSIS — Z9889 Other specified postprocedural states: Secondary | ICD-10-CM | POA: Insufficient documentation

## 2012-06-11 DIAGNOSIS — Z79899 Other long term (current) drug therapy: Secondary | ICD-10-CM | POA: Insufficient documentation

## 2012-06-11 DIAGNOSIS — Z9089 Acquired absence of other organs: Secondary | ICD-10-CM | POA: Insufficient documentation

## 2012-06-11 DIAGNOSIS — Z8739 Personal history of other diseases of the musculoskeletal system and connective tissue: Secondary | ICD-10-CM | POA: Insufficient documentation

## 2012-06-11 DIAGNOSIS — N209 Urinary calculus, unspecified: Secondary | ICD-10-CM | POA: Insufficient documentation

## 2012-06-11 DIAGNOSIS — Z9884 Bariatric surgery status: Secondary | ICD-10-CM | POA: Insufficient documentation

## 2012-06-11 DIAGNOSIS — Z8669 Personal history of other diseases of the nervous system and sense organs: Secondary | ICD-10-CM | POA: Insufficient documentation

## 2012-06-11 DIAGNOSIS — R630 Anorexia: Secondary | ICD-10-CM | POA: Insufficient documentation

## 2012-06-11 DIAGNOSIS — Z8709 Personal history of other diseases of the respiratory system: Secondary | ICD-10-CM | POA: Insufficient documentation

## 2012-06-11 MED ORDER — LORAZEPAM 2 MG/ML IJ SOLN
1.0000 mg | Freq: Once | INTRAMUSCULAR | Status: AC
  Administered 2012-06-11: 1 mg via INTRAVENOUS
  Filled 2012-06-11: qty 1

## 2012-06-11 MED ORDER — OXYCODONE HCL 5 MG PO TABS: 5.0000 mg | ORAL_TABLET | ORAL | Status: DC | PRN

## 2012-06-11 MED ORDER — PROMETHAZINE HCL 25 MG PO TABS: 25.0000 mg | ORAL_TABLET | Freq: Four times a day (QID) | ORAL | Status: DC | PRN

## 2012-06-11 MED ORDER — HYDROMORPHONE HCL PF 1 MG/ML IJ SOLN
1.0000 mg | Freq: Once | INTRAMUSCULAR | Status: AC
  Administered 2012-06-11: 1 mg via INTRAVENOUS
  Filled 2012-06-11: qty 1

## 2012-06-11 MED ORDER — TAMSULOSIN HCL 0.4 MG PO CAPS
0.4000 mg | ORAL_CAPSULE | Freq: Once | ORAL | Status: AC
  Administered 2012-06-11: 0.4 mg via ORAL
  Filled 2012-06-11: qty 1

## 2012-06-11 MED ORDER — KETOROLAC TROMETHAMINE 60 MG/2ML IM SOLN: 60.0000 mg | Freq: Once | INTRAMUSCULAR | Status: DC

## 2012-06-11 MED ORDER — KETOROLAC TROMETHAMINE 30 MG/ML IJ SOLN
30.0000 mg | Freq: Once | INTRAMUSCULAR | Status: AC
  Administered 2012-06-11: 30 mg via INTRAVENOUS
  Filled 2012-06-11: qty 1

## 2012-06-11 MED ORDER — ONDANSETRON HCL 4 MG/2ML IJ SOLN
4.0000 mg | Freq: Once | INTRAMUSCULAR | Status: AC
  Administered 2012-06-11: 4 mg via INTRAVENOUS
  Filled 2012-06-11: qty 2

## 2012-06-11 MED ORDER — TAMSULOSIN HCL 0.4 MG PO CAPS: 0.4000 mg | ORAL_CAPSULE | Freq: Every day | ORAL | Status: DC

## 2012-06-11 NOTE — ED Notes (Signed)
ZOX:[id number]<AV> Expected date:<BR> Expected time:<BR> Means of arrival:<BR> Comments:<BR> Medic 241, 66 F, Left Flank Pain

## 2012-06-11 NOTE — ED Notes (Signed)
Per EMS, pt from home.  Started with left flank pain at around 3 am.  Hx of kidney stones.  Pt took oxycontin at home at around 6pm along with phenergan.  IV: 18g LAC,   HX stones, ovarian ca, Vitals:  122/68, hr 78, resp 22

## 2012-06-11 NOTE — ED Notes (Signed)
Care of pt assumed. Pt groggy. Sts she is not having any pain since receiving medication and feels very relaxed. Updated on plan for CT scan. Pt and husband verbalize agreement and understanding. Will continue to monitor.

## 2012-06-11 NOTE — ED Notes (Signed)
Pt returned from CT scan.

## 2012-06-11 NOTE — ED Notes (Signed)
Pt. Is unable to use the restroom at this time, but she is aware that we need a urine specimen.  

## 2012-06-11 NOTE — ED Provider Notes (Signed)
History     CSN: [id number]  Arrival date & time 06/11/12  7829   First MD Initiated Contact with Patient 06/11/12 [phone]      Chief Complaint  Patient presents with  . Flank Pain    (Consider location/radiation/quality/duration/timing/severity/associated sxs/prior treatment) Patient is a 67 y.o. female presenting with flank pain. The history is provided by medical records and the patient. A language interpreter was used.  Flank Pain This is a recurrent problem. The current episode started yesterday. The problem occurs constantly. Associated symptoms include abdominal pain, anorexia, nausea and vomiting. Pertinent negatives include no arthralgias, change in bowel habit, chest pain, chills, congestion, coughing, diaphoresis, fatigue, fever, headaches, joint swelling, myalgias, neck pain, numbness, rash, sore throat, swollen glands, urinary symptoms, vertigo, visual change or weakness. Nothing aggravates the symptoms. She has tried oral narcotics (anitemetics) for the symptoms. The treatment provided no relief.    67 y/o female with history of kidney stones presents with sudden onset left sided flank pain. She has a history of kidney stones and has emergency oxycontin 10 and phenergan.  She tried to use these medications without relief of her symptoms.  He said complaint of left flank pain that radiates to the groin.  This is consistent with her previous kidney stones however they have always been on the right side.  She has never had a kidney stent as not past.  She is followed by Dr. Annabell Howells of [facility]. She has a history of ovarian malignancy and is in remission.  Denies fevers, chills, myalgias, arthralgias. Denies DOE, SOB, chest tightness or pressure, radiation to left arm, jaw or back, or diaphoresis. Denies dysuria,suprapubic pain, frequency, urgency, or hematuria. Denies headaches, light headedness, weakness, visual disturbances. Denies diarrhea or constipation.    Past Medical  History  Diagnosis Date  . VIRAL URI 03/06/2009  . Cervicalgia 05/19/2009  . WEIGHT GAIN 06/30/2008  . Headache 05/19/2009  . SPRAIN AND STRAIN OF UNSPECIFIED SITE OF HAND 05/19/2009  . NEOPLASM, MALIGNANT, OVARY, HX OF 06/30/2008    Past Surgical History  Procedure Laterality Date  . Appendectomy    . Abdominal hysterectomy    . Ovarian cancer  2007  . Cesarean section    . Splenectomy  2007  . Gastric bypass  1979  . Transurethral resection of bladder  2007    LESION  . Renal calculi      Family History  Problem Relation Age of Onset  . Arthritis Other     History  Substance Use Topics  . Smoking status: Never Smoker   . Smokeless tobacco: Not on file  . Alcohol Use: Not on file    OB History   Grav Para Term Preterm Abortions TAB SAB Ect Mult Living                  Review of Systems  Constitutional: Negative for fever, chills, diaphoresis and fatigue.  HENT: Negative for congestion, sore throat and neck pain.   Respiratory: Negative for cough.   Cardiovascular: Negative for chest pain.  Gastrointestinal: Positive for nausea, vomiting, abdominal pain and anorexia. Negative for change in bowel habit.  Genitourinary: Positive for flank pain.  Musculoskeletal: Negative for myalgias, joint swelling and arthralgias.  Skin: Negative for rash.  Neurological: Negative for vertigo, weakness, numbness and headaches.    Allergies  Penicillins  Home Medications   Current Outpatient Rx  Name  Route  Sig  Dispense  Refill  . Calcium Carbonate-Vitamin D (CALCIUM + D  PO)   Oral   Take by mouth daily. Total of 1800 units daily         . Multiple Vitamin (MULTIVITAMIN) tablet   Oral   Take 1 tablet by mouth daily.         . Omega-3 Fatty Acids (FISH OIL PO)   Oral   Take 1,500 Units by mouth daily.         . vitamin E 400 UNIT capsule   Oral   Take 400 Units by mouth daily.         Marland Kitchen zolpidem (AMBIEN) 10 MG tablet   Oral   Take 1 tablet (10 mg total) by  mouth at bedtime.   30 tablet   5     BP 111/42  Pulse 76  Temp(Src) 97.4 F (36.3 C) (Oral)  Resp 22  Ht 5\' 2"  (1.575 m)  Wt 175 lb (79.379 kg)  BMI 32 kg/m2  SpO2 100%  Physical Exam  Constitutional: She is oriented to person, place, and time. She appears well-developed and well-nourished. No distress.  Patient appears extremely uncomfortable. Speaks in short sentences.  Writhing in pain.  HENT:  Head: Normocephalic and atraumatic.  Eyes: Conjunctivae are normal. No scleral icterus.  Neck: Normal range of motion.  Cardiovascular: Normal rate, regular rhythm and normal heart sounds.  Exam reveals no gallop and no friction rub.   No murmur heard. Pulmonary/Chest: Effort normal and breath sounds normal. No respiratory distress.  Abdominal: Soft. Bowel sounds are normal. She exhibits no distension and no mass. There is tenderness. There is no guarding.  + CVA  Tenderness left side.  Neurological: She is alert and oriented to person, place, and time.  Skin: Skin is warm and dry. She is not diaphoretic.    ED Course  Procedures (including critical care time)  Labs Reviewed  URINALYSIS, ROUTINE W REFLEX MICROSCOPIC   No results found.   No diagnosis found.    MDM  7:08 AM BP 111/42  Pulse 76  Temp(Src) 97.4 F (36.3 C) (Oral)  Resp 22  Ht 5\' 2"  (1.575 m)  Wt 175 lb (79.379 kg)  BMI 32 kg/m2  SpO2 100% Patient with sxs consistent with renal colic.  UA pending.  Initiating sxs control and will obtain ct as she has never had stone on the left side and last ct in 2011.  9:10 AM Patient ct shows 5mm stone about 3cm above the VU jnx.  Patient likely to pass the stone.  Will evaluate for sxs control . Try to D/C with pain and antiemetic.   Pt has been diagnosed with a Kidney Stone via CT. Thereserum creatine WNL, vitals sign stable and the pt does not have irratractable vomiting. Pt will be dc home with pain medications & has been advised to follow up with her  urologist.    Arthor Captain, PA-C 06/11/12 1616

## 2012-06-11 NOTE — ED Notes (Signed)
Pt given water for PO fluid challenge. Reports tolerating ice well. Also given coke at pt request.

## 2012-06-12 NOTE — ED Provider Notes (Signed)
Medical screening examination/treatment/procedure(s) were performed by non-physician practitioner and as supervising physician I was immediately available for consultation/collaboration.  Geoffery Lyons, MD 06/12/12 [phone]

## 2012-06-14 ENCOUNTER — Emergency Department (HOSPITAL_COMMUNITY)
Admission: EM | Admit: 2012-06-14 | Discharge: 2012-06-15 | Disposition: A | Discharge status: Home / Self Care | Payer: BC Managed Care – PPO | Attending: Emergency Medicine | Admitting: Emergency Medicine

## 2012-06-14 DIAGNOSIS — Z8543 Personal history of malignant neoplasm of ovary: Secondary | ICD-10-CM | POA: Insufficient documentation

## 2012-06-14 DIAGNOSIS — N23 Unspecified renal colic: Secondary | ICD-10-CM | POA: Insufficient documentation

## 2012-06-14 DIAGNOSIS — Z87828 Personal history of other (healed) physical injury and trauma: Secondary | ICD-10-CM | POA: Insufficient documentation

## 2012-06-14 DIAGNOSIS — Z8709 Personal history of other diseases of the respiratory system: Secondary | ICD-10-CM | POA: Insufficient documentation

## 2012-06-14 MED ORDER — HYDROMORPHONE HCL PF 1 MG/ML IJ SOLN
1.0000 mg | INTRAMUSCULAR | Status: DC | PRN
  Administered 2012-06-14 – 2012-06-15 (×2): 1 mg via INTRAVENOUS
  Filled 2012-06-14 (×2): qty 1

## 2012-06-14 MED ORDER — ONDANSETRON HCL 4 MG/2ML IJ SOLN
4.0000 mg | Freq: Once | INTRAMUSCULAR | Status: AC
  Administered 2012-06-14: 4 mg via INTRAVENOUS
  Filled 2012-06-14: qty 2

## 2012-06-14 MED ORDER — ONDANSETRON HCL 4 MG/2ML IJ SOLN: 4.0000 mg | Freq: Once | INTRAMUSCULAR | Status: AC

## 2012-06-14 NOTE — ED Notes (Signed)
Report given via EMS. Pt was recently diagnosed Monday with 5cm left kidney stone. 20 gauge left AC. Pt had 150 of fentanyl en route. Pt state pain went from 10 to 8 and bearable. BP 141/67 HR 74 O2 98% RA at 2234.

## 2012-06-14 NOTE — ED Provider Notes (Signed)
History     CSN: [id number]  Arrival date & time 06/14/12  2234   First MD Initiated Contact with Patient 06/14/12 2323      Chief Complaint  Patient presents with  . Flank Pain    HPI Patient presents to the emergency room with complaints of severe left flank pain. She was recently in the emergency room on Monday with a similar episode. She was diagnosed with a 5 cm left kidney stone. The patient was treated in the emergency room in improved. She was feeling fine for a couple of days and thought she might have passed the stone until she started having severe pain again this evening.  The pain is sharp and radiates to her left groin. She has had nausea with the pain. Patient arrived by EMS. She was given fentanyl on route. Past Medical History  Diagnosis Date  . VIRAL URI 03/06/2009  . Cervicalgia 05/19/2009  . WEIGHT GAIN 06/30/2008  . Headache 05/19/2009  . SPRAIN AND STRAIN OF UNSPECIFIED SITE OF HAND 05/19/2009  . NEOPLASM, MALIGNANT, OVARY, HX OF 06/30/2008    Past Surgical History  Procedure Laterality Date  . Appendectomy    . Abdominal hysterectomy    . Ovarian cancer  2007  . Cesarean section    . Splenectomy  2007  . Gastric bypass  1979  . Transurethral resection of bladder  2007    LESION  . Renal calculi      Family History  Problem Relation Age of Onset  . Arthritis Other     History  Substance Use Topics  . Smoking status: Never Smoker   . Smokeless tobacco: Not on file  . Alcohol Use: No    OB History   Grav Para Term Preterm Abortions TAB SAB Ect Mult Living                  Review of Systems  All other systems reviewed and are negative.    Allergies  Morphine and related and Penicillins  Home Medications   Current Outpatient Rx  Name  Route  Sig  Dispense  Refill  . calcium carbonate (OS-CAL - DOSED IN MG OF ELEMENTAL CALCIUM) 1250 MG tablet   Oral   Take 2 tablets by mouth daily.          . Multiple Vitamin (MULTIVITAMIN) tablet  Oral   Take 1 tablet by mouth daily at 3 pm.          . oxyCODONE (OXY IR/ROXICODONE) 5 MG immediate release tablet   Oral   Take 1 tablet (5 mg total) by mouth every 4 (four) hours as needed for pain.   15 tablet   0   . promethazine (PHENERGAN) 25 MG tablet   Oral   Take 1 tablet (25 mg total) by mouth every 6 (six) hours as needed for nausea.   12 tablet   0   . tamsulosin (FLOMAX) 0.4 MG CAPS   Oral   Take 1 capsule (0.4 mg total) by mouth daily after breakfast.   10 capsule   0   . vitamin E 400 UNIT capsule   Oral   Take 400 Units by mouth daily.         Marland Kitchen zolpidem (AMBIEN) 10 MG tablet   Oral   Take 5 mg by mouth at bedtime.         Marland Kitchen HYDROmorphone (DILAUDID) 2 MG tablet   Oral   Take 1 tablet (2 mg  total) by mouth every 6 (six) hours as needed for pain.   30 tablet   0     BP 142/53  Pulse 77  Temp(Src) 98.1 F (36.7 C) (Oral)  Resp 18  SpO2 99%  Physical Exam  Nursing note and vitals reviewed. Constitutional: She appears distressed.  HENT:  Head: Normocephalic and atraumatic.  Right Ear: External ear normal.  Left Ear: External ear normal.  Eyes: Conjunctivae are normal. Right eye exhibits no discharge. Left eye exhibits no discharge. No scleral icterus.  Neck: Neck supple. No tracheal deviation present.  Cardiovascular: Normal rate, regular rhythm and intact distal pulses.   Pulmonary/Chest: Effort normal and breath sounds normal. No stridor. No respiratory distress. She has no wheezes. She has no rales.  Abdominal: Soft. Bowel sounds are normal. She exhibits no distension. There is no tenderness. There is CVA tenderness. There is no rebound and no guarding.  Musculoskeletal: She exhibits no edema and no tenderness.  Neurological: She is alert. She has normal strength. No sensory deficit. Cranial nerve deficit:  no gross defecits noted. She exhibits normal muscle tone. She displays no seizure activity. Coordination normal.  Skin: Skin is warm  and dry. No rash noted.  Psychiatric: She has a normal mood and affect.    ED Course  Procedures (including critical care time)  Labs Reviewed  CBC WITH DIFFERENTIAL - Abnormal; Notable for the following:    WBC 16.5 (*)    Neutrophils Relative 85 (*)    Neutro Abs 14.0 (*)    Lymphocytes Relative 9 (*)    All other components within normal limits  COMPREHENSIVE METABOLIC PANEL - Abnormal; Notable for the following:    Glucose, Bld 131 (*)    Albumin 3.4 (*)    GFR calc non Af Amer 56 (*)    GFR calc Af Amer 64 (*)    All other components within normal limits   No results found.   1. Ureteral colic       MDM  Pt is feeling much better after treatment.  Will dc home with oral dilaudid tablets. Instructed her to contact urology tomorrow.        Celene Kras, MD 06/15/12 Lyda Jester

## 2012-06-14 NOTE — ED Notes (Signed)
AVW:[id number]<BJ> Expected date:<BR> Expected time:<BR> Means of arrival:<BR> Comments:<BR> EMS/66 yo female diagnosed Monday with kidney stone-states pain unbearable/IV-had 100 mcg Fentanyl

## 2012-06-14 NOTE — ED Notes (Signed)
Pt took phenergan and oxycodone at 2100.

## 2012-06-15 LAB — COMPREHENSIVE METABOLIC PANEL
ALT: 14 U/L (ref 0–35)
AST: 16 U/L (ref 0–37)
Albumin: 3.4 g/dL — ABNORMAL LOW (ref 3.5–5.2)
Alkaline Phosphatase: 97 U/L (ref 39–117)
BUN: 15 mg/dL (ref 6–23)
CO2: 24 mEq/L (ref 19–32)
Calcium: 8.8 mg/dL (ref 8.4–10.5)
Chloride: 106 mEq/L (ref 96–112)
Creatinine, Ser: 1.04 mg/dL (ref 0.50–1.10)
GFR calc Af Amer: 64 mL/min — ABNORMAL LOW (ref >90)
GFR calc non Af Amer: 56 mL/min — ABNORMAL LOW (ref >90)
Glucose, Bld: 131 mg/dL — ABNORMAL HIGH (ref 70–99)
Potassium: 3.5 mEq/L (ref 3.5–5.1)
Sodium: 141 mEq/L (ref 135–145)
Total Bilirubin: 0.4 mg/dL (ref 0.3–1.2)
Total Protein: 6.8 g/dL (ref 6.0–8.3)

## 2012-06-15 LAB — CBC WITH DIFFERENTIAL/PLATELET
Basophils Absolute: 0 10*3/uL (ref 0.0–0.1)
Basophils Relative: 0 % (ref 0–1)
Eosinophils Absolute: 0 10*3/uL (ref 0.0–0.7)
Eosinophils Relative: 0 % (ref 0–5)
HCT: 38.2 % (ref 36.0–46.0)
Hemoglobin: 12.4 g/dL (ref 12.0–15.0)
Lymphocytes Relative: 9 % — ABNORMAL LOW (ref 12–46)
Lymphs Abs: 1.5 10*3/uL (ref 0.7–4.0)
MCH: 31.5 pg (ref 26.0–34.0)
MCHC: 32.5 g/dL (ref 30.0–36.0)
MCV: 97 fL (ref 78.0–100.0)
Monocytes Absolute: 0.9 10*3/uL (ref 0.1–1.0)
Monocytes Relative: 5 % (ref 3–12)
Neutro Abs: 14 10*3/uL — ABNORMAL HIGH (ref 1.7–7.7)
Neutrophils Relative %: 85 % — ABNORMAL HIGH (ref 43–77)
Platelets: 328 10*3/uL (ref 150–400)
RBC: 3.94 MIL/uL (ref 3.87–5.11)
RDW: 13.8 % (ref 11.5–15.5)
WBC: 16.5 10*3/uL — ABNORMAL HIGH (ref 4.0–10.5)

## 2012-06-15 MED ORDER — HYDROMORPHONE HCL 2 MG PO TABS: 2.0000 mg | ORAL_TABLET | Freq: Four times a day (QID) | ORAL | Status: DC | PRN

## 2012-06-15 MED ORDER — KETOROLAC TROMETHAMINE 30 MG/ML IJ SOLN
15.0000 mg | Freq: Once | INTRAMUSCULAR | Status: AC
  Administered 2012-06-15: 15 mg via INTRAVENOUS
  Filled 2012-06-15: qty 1

## 2012-07-29 ENCOUNTER — Other Ambulatory Visit: Payer: Self-pay | Admitting: Family Medicine

## 2012-07-31 NOTE — Telephone Encounter (Signed)
Ambien last filled 12-29-11, #30 with 5 refills

## 2012-08-01 NOTE — Telephone Encounter (Signed)
Refill Ok

## 2012-08-01 NOTE — Telephone Encounter (Signed)
Rx called in 

## 2012-08-14 ENCOUNTER — Encounter: Payer: Self-pay | Admitting: Family Medicine

## 2012-08-14 ENCOUNTER — Ambulatory Visit (INDEPENDENT_AMBULATORY_CARE_PROVIDER_SITE_OTHER): Payer: Medicare Other | Admitting: Family Medicine

## 2012-08-14 VITALS — BP 110/72 | Temp 98.5°F | Wt 181.0 lb

## 2012-08-14 DIAGNOSIS — J069 Acute upper respiratory infection, unspecified: Secondary | ICD-10-CM

## 2012-08-14 MED ORDER — HYDROCODONE-HOMATROPINE 5-1.5 MG/5ML PO SYRP: 5.0000 mL | ORAL_SOLUTION | Freq: Two times a day (BID) | ORAL | Status: DC | PRN

## 2012-08-14 NOTE — Progress Notes (Signed)
Chief Complaint  Patient presents with  . Cough    congestion, hoarseness, sore throat; down into chest     HPI:  Acute visit for cough: -started 2 days ago -symptoms: nasal congestion, cough, drainage in throat, yellow mucus, HA -denies: fever, SOB, NVD, ear pain or tooth pain -sick contacts: husband and daughter -tried: advil -wants cough med with hydro codeine - reports NOT allergic to this ROS: See pertinent positives and negatives per HPI.  Past Medical History  Diagnosis Date  . VIRAL URI 03/06/2009  . Cervicalgia 05/19/2009  . WEIGHT GAIN 06/30/2008  . Headache 05/19/2009  . SPRAIN AND STRAIN OF UNSPECIFIED SITE OF HAND 05/19/2009  . NEOPLASM, MALIGNANT, OVARY, HX OF 06/30/2008    Family History  Problem Relation Age of Onset  . Arthritis Other     History   Social History  . Marital Status: Married    Spouse Name: N/A    Number of Children: N/A  . Years of Education: N/A   Social History Main Topics  . Smoking status: Never Smoker   . Smokeless tobacco: None  . Alcohol Use: No  . Drug Use: No  . Sexually Active: None   Other Topics Concern  . None   Social History Narrative  . None    Current outpatient prescriptions:calcium carbonate (OS-CAL - DOSED IN MG OF ELEMENTAL CALCIUM) 1250 MG tablet, Take 2 tablets by mouth daily. , Disp: , Rfl: ;  Multiple Vitamin (MULTIVITAMIN) tablet, Take 1 tablet by mouth daily at 3 pm. , Disp: , Rfl: ;  vitamin E 400 UNIT capsule, Take 400 Units by mouth daily., Disp: , Rfl: ;  zolpidem (AMBIEN) 10 MG tablet, TAKE 1 TABLET BY MOUTH AT BEDTIME, Disp: 30 tablet, Rfl: 5 HYDROcodone-homatropine (HYCODAN) 5-1.5 MG/5ML syrup, Take 5 mLs by mouth every 12 (twelve) hours as needed for cough., Disp: 120 mL, Rfl: 0;  HYDROmorphone (DILAUDID) 2 MG tablet, Take 1 tablet (2 mg total) by mouth every 6 (six) hours as needed for pain., Disp: 30 tablet, Rfl: 0;  oxyCODONE (OXY IR/ROXICODONE) 5 MG immediate release tablet, Take 1 tablet (5 mg  total) by mouth every 4 (four) hours as needed for pain., Disp: 15 tablet, Rfl: 0 promethazine (PHENERGAN) 25 MG tablet, Take 1 tablet (25 mg total) by mouth every 6 (six) hours as needed for nausea., Disp: 12 tablet, Rfl: 0;  tamsulosin (FLOMAX) 0.4 MG CAPS, Take 1 capsule (0.4 mg total) by mouth daily after breakfast., Disp: 10 capsule, Rfl: 0  EXAM:  Filed Vitals:   08/14/12 0907  BP: 110/72  Temp: 98.5 F (36.9 C)    Body mass index is 33.1 kg/(m^2).  GENERAL: vitals reviewed and listed above, alert, oriented, appears well hydrated and in no acute distress  HEENT: atraumatic, conjunttiva clear, no obvious abnormalities on inspection of external nose and ears, normal appearance of ear canals and TMs, clear nasal congestion, mild post oropharyngeal erythema with PND, no tonsillar edema or exudate, no sinus TTP  NECK: no obvious masses on inspection  LUNGS: clear to auscultation bilaterally, no wheezes, rales or rhonchi, good air movement  CV: HRRR, no peripheral edema  MS: moves all extremities without noticeable abnormality  PSYCH: pleasant and cooperative, no obvious depression or anxiety  ASSESSMENT AND PLAN:  Discussed the following assessment and plan:  Viral upper respiratory infection - Plan: HYDROcodone-homatropine (HYCODAN) 5-1.5 MG/5ML syrup  -likely viral - advised supportive care and return precautions. Cough medication given - risks discussed. -Patient advised  to return or notify a doctor immediately if symptoms worsen or persist or new concerns arise.  Patient Instructions  INSTRUCTIONS FOR UPPER RESPIRATORY INFECTION:  -plenty of rest and fluids  -nasal saline wash 2-3 times daily (use prepackaged nasal saline or bottled/distilled water if making your own)   -can use sinex nasal spray for drainage and nasal congestion - but do NOT use longer then 3-4 days  -can use tylenol or ibuprofen as directed for aches and sorethroat  -in the winter time, using a  humidifier at night is helpful (please follow cleaning instructions)  -if you are taking a cough medication - use only as directed, may also try a teaspoon of honey to coat the throat and throat lozenges  -for sore throat, salt water gargles can help  -follow up if you have fevers, facial pain, tooth pain, difficulty breathing or are worsening or not getting better       Kriste Basque R.

## 2012-08-14 NOTE — Patient Instructions (Addendum)
INSTRUCTIONS FOR UPPER RESPIRATORY INFECTION:  -plenty of rest and fluids  -nasal saline wash 2-3 times daily (use prepackaged nasal saline or bottled/distilled water if making your own)   -can use sinex nasal spray for drainage and nasal congestion - but do NOT use longer then 3-4 days  -can use tylenol or ibuprofen as directed for aches and sorethroat  -in the winter time, using a humidifier at night is helpful (please follow cleaning instructions)  -if you are taking a cough medication - use only as directed, may also try a teaspoon of honey to coat the throat and throat lozenges  -for sore throat, salt water gargles can help  -follow up if you have fevers, facial pain, tooth pain, difficulty breathing or are worsening or not getting better

## 2012-08-16 DIAGNOSIS — L570 Actinic keratosis: Secondary | ICD-10-CM | POA: Diagnosis not present

## 2012-10-09 ENCOUNTER — Other Ambulatory Visit: Payer: Self-pay

## 2012-10-09 DIAGNOSIS — Z1231 Encounter for screening mammogram for malignant neoplasm of breast: Secondary | ICD-10-CM

## 2012-10-15 DIAGNOSIS — D235 Other benign neoplasm of skin of trunk: Secondary | ICD-10-CM | POA: Diagnosis not present

## 2012-10-15 DIAGNOSIS — L570 Actinic keratosis: Secondary | ICD-10-CM | POA: Diagnosis not present

## 2012-10-30 ENCOUNTER — Ambulatory Visit (INDEPENDENT_AMBULATORY_CARE_PROVIDER_SITE_OTHER): Payer: Medicare Other | Admitting: Family Medicine

## 2012-10-30 ENCOUNTER — Encounter: Payer: Self-pay | Admitting: Family Medicine

## 2012-10-30 VITALS — BP 112/68 | HR 76 | Temp 98.2°F | Wt 183.0 lb

## 2012-10-30 DIAGNOSIS — M7062 Trochanteric bursitis, left hip: Secondary | ICD-10-CM

## 2012-10-30 DIAGNOSIS — M76899 Other specified enthesopathies of unspecified lower limb, excluding foot: Secondary | ICD-10-CM | POA: Diagnosis not present

## 2012-10-30 DIAGNOSIS — M7061 Trochanteric bursitis, right hip: Secondary | ICD-10-CM

## 2012-10-30 DIAGNOSIS — M722 Plantar fascial fibromatosis: Secondary | ICD-10-CM | POA: Diagnosis not present

## 2012-10-30 NOTE — Patient Instructions (Addendum)
Plantar Fasciitis Plantar fasciitis is a common condition that causes foot pain. It is soreness (inflammation) of the band of tough fibrous tissue on the bottom of the foot that runs from the heel bone (calcaneus) to the ball of the foot. The cause of this soreness may be from excessive standing, poor fitting shoes, running on hard surfaces, being overweight, having an abnormal walk, or overuse (this is common in runners) of the painful foot or feet. It is also common in aerobic exercise dancers and ballet dancers. SYMPTOMS  Most people with plantar fasciitis complain of:  Severe pain in the morning on the bottom of their foot especially when taking the first steps out of bed. This pain recedes after a few minutes of walking.  Severe pain is experienced also during walking following a long period of inactivity.  Pain is worse when walking barefoot or up stairs DIAGNOSIS   Your caregiver will diagnose this condition by examining and feeling your foot.  Special tests such as X-rays of your foot, are usually not needed. PREVENTION   Consult a sports medicine professional before beginning a new exercise program.  Walking programs offer a good workout. With walking there is a lower chance of overuse injuries common to runners. There is less impact and less jarring of the joints.  Begin all new exercise programs slowly. If problems or pain develop, decrease the amount of time or distance until you are at a comfortable level.  Wear good shoes and replace them regularly.  Stretch your foot and the heel cords at the back of the ankle (Achilles tendon) both before and after exercise.  Run or exercise on even surfaces that are not hard. For example, asphalt is better than pavement.  Do not run barefoot on hard surfaces.  If using a treadmill, vary the incline.  Do not continue to workout if you have foot or joint problems. Seek professional help if they do not improve. HOME CARE INSTRUCTIONS     Avoid activities that cause you pain until you recover.  Use ice or cold packs on the problem or painful areas after working out.  Only take over-the-counter or prescription medicines for pain, discomfort, or fever as directed by your caregiver.  Soft shoe inserts or athletic shoes with air or gel sole cushions may be helpful.  If problems continue or become more severe, consult a sports medicine caregiver or your own health care provider. Cortisone is a potent anti-inflammatory medication that may be injected into the painful area. You can discuss this treatment with your caregiver. MAKE SURE YOU:   Understand these instructions.  Will watch your condition.  Will get help right away if you are not doing well or get worse. Document Released: 12/07/2000 Document Revised: 06/06/2011 Document Reviewed: 02/06/2008 [facility] Patient Information 2014 [place], [place].  Hip Bursitis Bursitis is a swelling and soreness (inflammation) of a fluid-filled sac (bursa). This sac overlies and protects the joints.  CAUSES   Injury.  Overuse of the muscles surrounding the joint.  Arthritis.  Gout.  Infection.  Cold weather.  Inadequate warm-up and conditioning prior to activities. The cause may not be known.  SYMPTOMS   Mild to severe irritation.  Tenderness and swelling over the outside of the hip.  Pain with motion of the hip.  If the bursa becomes infected, a fever may be present. Redness, tenderness, and warmth will develop over the hip. Symptoms usually lessen in 3 to 4 weeks with treatment, but can come back. TREATMENT  If conservative treatment does not work, your caregiver may advise draining the bursa and injecting cortisone into the area. This may speed up the healing process. This may also be used as an initial treatment of choice. HOME CARE INSTRUCTIONS   Apply ice to the affected area for 15-20 minutes every 3 to 4 hours while awake for the first 2 days. Put the ice in  a plastic bag and place a towel between the bag of ice and your skin.  Rest the painful joint as much as possible, but continue to put the joint through a normal range of motion at least 4 times per day. When the pain lessens, begin normal, slow movements and usual activities to help prevent stiffness of the hip.  Only take over-the-counter or prescription medicines for pain, discomfort, or fever as directed by your caregiver.  Use crutches to limit weight bearing on the hip joint, if advised.  Elevate your painful hip to reduce swelling. Use pillows for propping and cushioning your legs and hips.  Gentle massage may provide comfort and decrease swelling. SEEK IMMEDIATE MEDICAL CARE IF:   Your pain increases even during treatment, or you are not improving.  You have a fever.  You have heat and inflammation over the involved bursa.  You have any other questions or concerns. MAKE SURE YOU:   Understand these instructions.  Will watch your condition.  Will get help right away if you are not doing well or get worse. Document Released: 09/03/2001 Document Revised: 06/06/2011 Document Reviewed: 04/02/2008 [facility] Patient Information 2014 [place], [place].

## 2012-10-30 NOTE — Progress Notes (Signed)
  Subjective:    Patient ID: [name], female    DOB: [birth year], 67 y.o.   MRN: [id number]  HPI Patient seen with right foot pain. Duration is approximately 8 weeks. No injury. No recent change of shoe wear. Location is plantar fascia region. Pain is moderate. Worse after prolonged periods of inactivity. Advil helps slightly. Has not tried any icing. She's doing some stretching. No achilles pain.  Also bilateral hip pain which she thinks may be worsened by her altered gait from heel pain. . Pains are also bilateral and sore to touch. She's not seeing any rash or erythema. No medial hip pain. Pain is worse after prolonged periods of inactivity when first initiating walking.  Past Medical History  Diagnosis Date  . VIRAL URI 03/06/2009  . Cervicalgia 05/19/2009  . WEIGHT GAIN 06/30/2008  . Headache(784.0) 05/19/2009  . SPRAIN AND STRAIN OF UNSPECIFIED SITE OF HAND 05/19/2009  . NEOPLASM, MALIGNANT, OVARY, HX OF 06/30/2008   Past Surgical History  Procedure Laterality Date  . Appendectomy    . Abdominal hysterectomy    . Ovarian cancer  2007  . Cesarean section    . Splenectomy  2007  . Gastric bypass  1979  . Transurethral resection of bladder  2007    LESION  . Renal calculi      reports that she has never smoked. She does not have any smokeless tobacco history on file. She reports that she does not drink alcohol or use illicit drugs. family history includes Arthritis in her other. Allergies  Allergen Reactions  . Morphine And Related Nausea And Vomiting  . Penicillins Hives      Review of Systems  Constitutional: Negative for fever, appetite change and unexpected weight change.  Musculoskeletal: Negative for back pain.  Skin: Negative for rash.  Neurological: Negative for weakness and numbness.  Hematological: Negative for adenopathy. Does not bruise/bleed easily.       Objective:   Physical Exam  Constitutional: She appears well-developed and well-nourished.   Cardiovascular: Normal rate and regular rhythm.   Pulmonary/Chest: Effort normal and breath sounds normal. No respiratory distress. She has no wheezes. She has no rales.  Musculoskeletal:  Right foot reveals tenderness over the plantar fascia region. She has no Achilles tenderness. Full range of motion right ankle. No erythema. No ecchymosis. Good distal foot pulses.  Patient has full range of motion both hips. Tenderness over the greater trochanteric bursa bilaterally.  Neurological:  Full-strength lower extremities          Assessment & Plan:  #1 right plantar fasciitis. We discussed options. She'll try icing first. Continue stretches. Consider heel cups. She's not interested in corticosteroid injection this time. #2 bilateral greater trochanter bursitis. Recommend icing several times daily. We offered corticosteroid injections and at this point she wishes to observe

## 2012-11-20 ENCOUNTER — Ambulatory Visit: Payer: Medicare Other

## 2012-11-27 ENCOUNTER — Ambulatory Visit: Payer: Medicare Other

## 2012-11-30 ENCOUNTER — Ambulatory Visit
Admission: RE | Admit: 2012-11-30 | Discharge: 2012-11-30 | Disposition: A | Discharge status: Home / Self Care | Payer: Medicare Other | Source: Ambulatory Visit

## 2012-11-30 DIAGNOSIS — Z1231 Encounter for screening mammogram for malignant neoplasm of breast: Secondary | ICD-10-CM | POA: Diagnosis not present

## 2012-12-12 ENCOUNTER — Emergency Department (HOSPITAL_COMMUNITY)
Admission: EM | Admit: 2012-12-12 | Discharge: 2012-12-12 | Disposition: A | Discharge status: Home / Self Care | Payer: Medicare Other | Attending: Emergency Medicine | Admitting: Emergency Medicine

## 2012-12-12 ENCOUNTER — Emergency Department (HOSPITAL_COMMUNITY): Payer: Medicare Other

## 2012-12-12 DIAGNOSIS — N390 Urinary tract infection, site not specified: Secondary | ICD-10-CM | POA: Insufficient documentation

## 2012-12-12 DIAGNOSIS — Z8709 Personal history of other diseases of the respiratory system: Secondary | ICD-10-CM | POA: Insufficient documentation

## 2012-12-12 DIAGNOSIS — R109 Unspecified abdominal pain: Secondary | ICD-10-CM | POA: Diagnosis not present

## 2012-12-12 DIAGNOSIS — Z8543 Personal history of malignant neoplasm of ovary: Secondary | ICD-10-CM | POA: Insufficient documentation

## 2012-12-12 DIAGNOSIS — N200 Calculus of kidney: Secondary | ICD-10-CM | POA: Diagnosis not present

## 2012-12-12 DIAGNOSIS — Z87828 Personal history of other (healed) physical injury and trauma: Secondary | ICD-10-CM | POA: Diagnosis not present

## 2012-12-12 DIAGNOSIS — Z792 Long term (current) use of antibiotics: Secondary | ICD-10-CM | POA: Diagnosis not present

## 2012-12-12 DIAGNOSIS — Z880 Allergy status to penicillin: Secondary | ICD-10-CM | POA: Insufficient documentation

## 2012-12-12 DIAGNOSIS — Z79899 Other long term (current) drug therapy: Secondary | ICD-10-CM | POA: Insufficient documentation

## 2012-12-12 DIAGNOSIS — N201 Calculus of ureter: Secondary | ICD-10-CM | POA: Diagnosis not present

## 2012-12-12 DIAGNOSIS — Z9089 Acquired absence of other organs: Secondary | ICD-10-CM | POA: Insufficient documentation

## 2012-12-12 DIAGNOSIS — K2970 Gastritis, unspecified, without bleeding: Secondary | ICD-10-CM | POA: Diagnosis not present

## 2012-12-12 DIAGNOSIS — R112 Nausea with vomiting, unspecified: Secondary | ICD-10-CM | POA: Diagnosis not present

## 2012-12-12 DIAGNOSIS — Z90710 Acquired absence of both cervix and uterus: Secondary | ICD-10-CM | POA: Diagnosis not present

## 2012-12-12 DIAGNOSIS — N1330 Unspecified hydronephrosis: Secondary | ICD-10-CM | POA: Diagnosis not present

## 2012-12-12 LAB — POCT I-STAT, CHEM 8
BUN: 25 mg/dL — ABNORMAL HIGH (ref 6–23)
Calcium, Ion: 1.18 mmol/L (ref 1.13–1.30)
Chloride: 105 mEq/L (ref 96–112)
Creatinine, Ser: 1.3 mg/dL — ABNORMAL HIGH (ref 0.50–1.10)
Glucose, Bld: 115 mg/dL — ABNORMAL HIGH (ref 70–99)
HCT: 41 % (ref 36.0–46.0)
Hemoglobin: 13.9 g/dL (ref 12.0–15.0)
Potassium: 3.6 mEq/L (ref 3.5–5.1)
Sodium: 141 mEq/L (ref 135–145)
TCO2: 25 mmol/L (ref 0–100)

## 2012-12-12 LAB — URINALYSIS, ROUTINE W REFLEX MICROSCOPIC
Bilirubin Urine: NEGATIVE
Glucose, UA: NEGATIVE mg/dL
Ketones, ur: 15 mg/dL — AB
Nitrite: POSITIVE — AB
Protein, ur: NEGATIVE mg/dL
Specific Gravity, Urine: 1.023 (ref 1.005–1.030)
Urobilinogen, UA: 0.2 mg/dL (ref 0.0–1.0)
pH: 6 (ref 5.0–8.0)

## 2012-12-12 LAB — BASIC METABOLIC PANEL
BUN: 21 mg/dL (ref 6–23)
CO2: 24 mEq/L (ref 19–32)
Calcium: 9.6 mg/dL (ref 8.4–10.5)
Chloride: 100 mEq/L (ref 96–112)
Creatinine, Ser: 1.16 mg/dL — ABNORMAL HIGH (ref 0.50–1.10)
GFR calc Af Amer: 56 mL/min — ABNORMAL LOW (ref >90)
GFR calc non Af Amer: 48 mL/min — ABNORMAL LOW (ref >90)
Glucose, Bld: 118 mg/dL — ABNORMAL HIGH (ref 70–99)
Potassium: 3.7 mEq/L (ref 3.5–5.1)
Sodium: 137 mEq/L (ref 135–145)

## 2012-12-12 LAB — CBC WITH DIFFERENTIAL/PLATELET
Basophils Absolute: 0 10*3/uL (ref 0.0–0.1)
Basophils Relative: 0 % (ref 0–1)
Eosinophils Absolute: 0 10*3/uL (ref 0.0–0.7)
Eosinophils Relative: 0 % (ref 0–5)
HCT: 39.4 % (ref 36.0–46.0)
Hemoglobin: 13 g/dL (ref 12.0–15.0)
Lymphocytes Relative: 5 % — ABNORMAL LOW (ref 12–46)
Lymphs Abs: 0.7 10*3/uL (ref 0.7–4.0)
MCH: 31.9 pg (ref 26.0–34.0)
MCHC: 33 g/dL (ref 30.0–36.0)
MCV: 96.8 fL (ref 78.0–100.0)
Monocytes Absolute: 0.1 10*3/uL (ref 0.1–1.0)
Monocytes Relative: 1 % — ABNORMAL LOW (ref 3–12)
Neutro Abs: 13.8 10*3/uL — ABNORMAL HIGH (ref 1.7–7.7)
Neutrophils Relative %: 94 % — ABNORMAL HIGH (ref 43–77)
Platelets: 323 10*3/uL (ref 150–400)
RBC: 4.07 MIL/uL (ref 3.87–5.11)
RDW: 13.2 % (ref 11.5–15.5)
WBC: 14.6 10*3/uL — ABNORMAL HIGH (ref 4.0–10.5)

## 2012-12-12 LAB — URINE MICROSCOPIC-ADD ON

## 2012-12-12 MED ORDER — HYDROMORPHONE HCL PF 1 MG/ML IJ SOLN
0.5000 mg | INTRAMUSCULAR | Status: AC | PRN
  Administered 2012-12-12 (×2): 0.5 mg via INTRAVENOUS
  Administered 2012-12-12: 1 mg via INTRAVENOUS
  Filled 2012-12-12 (×2): qty 1

## 2012-12-12 MED ORDER — CIPROFLOXACIN HCL 500 MG PO TABS: 500.0000 mg | ORAL_TABLET | Freq: Two times a day (BID) | ORAL | Status: DC

## 2012-12-12 MED ORDER — ONDANSETRON HCL 4 MG/2ML IJ SOLN
4.0000 mg | Freq: Once | INTRAMUSCULAR | Status: AC
  Administered 2012-12-12: 4 mg via INTRAVENOUS
  Filled 2012-12-12: qty 2

## 2012-12-12 MED ORDER — OXYCODONE-ACETAMINOPHEN 5-325 MG PO TABS: 1.0000 | ORAL_TABLET | ORAL | Status: DC | PRN

## 2012-12-12 MED ORDER — CIPROFLOXACIN IN D5W 400 MG/200ML IV SOLN: 400.0000 mg | Freq: Two times a day (BID) | INTRAVENOUS | Status: DC

## 2012-12-12 MED ORDER — CIPROFLOXACIN IN D5W 400 MG/200ML IV SOLN
400.0000 mg | Freq: Two times a day (BID) | INTRAVENOUS | Status: DC
  Administered 2012-12-12: 400 mg via INTRAVENOUS
  Filled 2012-12-12: qty 200

## 2012-12-12 MED ORDER — ONDANSETRON 8 MG PO TBDP: 8.0000 mg | ORAL_TABLET | Freq: Three times a day (TID) | ORAL | Status: DC | PRN

## 2012-12-12 MED ORDER — KETOROLAC TROMETHAMINE 30 MG/ML IJ SOLN
30.0000 mg | Freq: Once | INTRAMUSCULAR | Status: AC
  Administered 2012-12-12: 30 mg via INTRAVENOUS
  Filled 2012-12-12: qty 1

## 2012-12-12 NOTE — ED Notes (Signed)
Per pt, started having right flank pain since last night-pain worse today-history of kidney stones-4 mg of Zofran and 150 mcg of fentanyl given in route-last pain dose was at 1415-6/10 pain at this time-pt called PCP but unable to take PO meds because of vomiting

## 2012-12-12 NOTE — ED Provider Notes (Signed)
CSN: [id number]     Arrival date & time 12/12/12  1419 History   First MD Initiated Contact with Patient 12/12/12 1505     Chief Complaint  Patient presents with  . Flank Pain    HPI Comments: Pt started having pain in her right flank last night.  This feels very similar to a prior kidney stone that she passed without difficulty.  She arrived via EMS and was given fentanyl and zofran.  The pain has decreased following that.  She tried taking tamsulosin called in by her PCP but she has not passed the stone.  Patient is a 67 y.o. female presenting with flank pain. The history is provided by the patient.  Flank Pain This is a recurrent problem. The current episode started yesterday. The problem occurs constantly. The problem has been rapidly worsening. Nothing aggravates the symptoms. Nothing relieves the symptoms.    Past Medical History  Diagnosis Date  . VIRAL URI 03/06/2009  . Cervicalgia 05/19/2009  . WEIGHT GAIN 06/30/2008  . Headache(784.0) 05/19/2009  . SPRAIN AND STRAIN OF UNSPECIFIED SITE OF HAND 05/19/2009  . NEOPLASM, MALIGNANT, OVARY, HX OF 06/30/2008   Past Surgical History  Procedure Laterality Date  . Appendectomy    . Abdominal hysterectomy    . Ovarian cancer  2007  . Cesarean section    . Splenectomy  2007  . Gastric bypass  1979  . Transurethral resection of bladder  2007    LESION  . Renal calculi     Family History  Problem Relation Age of Onset  . Arthritis Other    History  Substance Use Topics  . Smoking status: Never Smoker   . Smokeless tobacco: Not on file  . Alcohol Use: No   OB History   Grav Para Term Preterm Abortions TAB SAB Ect Mult Living                 Review of Systems  Constitutional: Negative for fever.  Gastrointestinal: Positive for nausea and vomiting.  Genitourinary: Positive for flank pain.    Allergies  Morphine and related and Penicillins  Home Medications   Current Outpatient Rx  Name  Route  Sig  Dispense  Refill  .  calcium carbonate (OS-CAL - DOSED IN MG OF ELEMENTAL CALCIUM) 1250 MG tablet   Oral   Take 2 tablets by mouth daily.          Marland Kitchen ibuprofen (ADVIL,MOTRIN) 200 MG tablet   Oral   Take 400 mg by mouth every 6 (six) hours as needed for pain.         . Multiple Vitamin (MULTIVITAMIN) tablet   Oral   Take 1 tablet by mouth daily.          Marland Kitchen zolpidem (AMBIEN) 10 MG tablet   Oral   Take 5 mg by mouth at bedtime.         . ciprofloxacin (CIPRO) 500 MG tablet   Oral   Take 1 tablet (500 mg total) by mouth 2 (two) times daily.   14 tablet   0   . ondansetron (ZOFRAN ODT) 8 MG disintegrating tablet   Oral   Take 1 tablet (8 mg total) by mouth every 8 (eight) hours as needed for nausea.   20 tablet   0   . oxyCODONE-acetaminophen (PERCOCET) 5-325 MG per tablet   Oral   Take 1-2 tablets by mouth every 4 (four) hours as needed for pain.   20 tablet  0    BP 101/57  Pulse 71  Temp(Src) 97.6 F (36.4 C) (Oral)  Resp 14  SpO2 95% Physical Exam  Nursing note and vitals reviewed. Constitutional: She appears well-developed and well-nourished. No distress.  HENT:  Head: Normocephalic and atraumatic.  Right Ear: External ear normal.  Left Ear: External ear normal.  Eyes: Conjunctivae are normal. Right eye exhibits no discharge. Left eye exhibits no discharge. No scleral icterus.  Neck: Neck supple. No tracheal deviation present.  Cardiovascular: Normal rate, regular rhythm and intact distal pulses.   Pulmonary/Chest: Effort normal and breath sounds normal. No stridor. No respiratory distress. She has no wheezes. She has no rales.  Abdominal: Soft. Bowel sounds are normal. She exhibits no distension. There is no tenderness. There is CVA tenderness. There is no rebound and no guarding. No hernia.  Musculoskeletal: She exhibits no edema and no tenderness.  Neurological: She is alert. She has normal strength. No sensory deficit. Cranial nerve deficit:  no gross defecits noted. She  exhibits normal muscle tone. She displays no seizure activity. Coordination normal.  Skin: Skin is warm and dry. No rash noted.  Psychiatric: She has a normal mood and affect.    ED Course  Procedures (including critical care time) Labs Review Labs Reviewed  BASIC METABOLIC PANEL - Abnormal; Notable for the following:    Glucose, Bld 118 (*)    Creatinine, Ser 1.16 (*)    GFR calc non Af Amer 48 (*)    GFR calc Af Amer 56 (*)    All other components within normal limits  CBC WITH DIFFERENTIAL - Abnormal; Notable for the following:    WBC 14.6 (*)    Neutrophils Relative % 94 (*)    Neutro Abs 13.8 (*)    Lymphocytes Relative 5 (*)    Monocytes Relative 1 (*)    All other components within normal limits  URINALYSIS, ROUTINE W REFLEX MICROSCOPIC - Abnormal; Notable for the following:    APPearance CLOUDY (*)    Hgb urine dipstick TRACE (*)    Ketones, ur 15 (*)    Nitrite POSITIVE (*)    Leukocytes, UA LARGE (*)    All other components within normal limits  URINE MICROSCOPIC-ADD ON - Abnormal; Notable for the following:    Bacteria, UA MANY (*)    All other components within normal limits  POCT I-STAT, CHEM 8 - Abnormal; Notable for the following:    BUN 25 (*)    Creatinine, Ser 1.30 (*)    Glucose, Bld 115 (*)    All other components within normal limits   Imaging Review Ct Abdomen Pelvis Wo Contrast  12/12/2012   CLINICAL DATA:  Right flank pain  EXAM: CT ABDOMEN AND PELVIS WITHOUT CONTRAST  TECHNIQUE: Multidetector CT imaging of the abdomen and pelvis was performed following the standard protocol without oral or intravenous contrast.  COMPARISON:  June 11, 2012  FINDINGS: Lung bases are clear.  No focal liver lesions are identified on this non contrast enhanced study. Gallbladder is absent. There is no biliary duct dilatation.  The spleen is absent.  Pancreas and adrenals appear normal.  There is a fairly large amount of renal sinus fat on the left. Left kidney shows no  demonstrable mass, calculus, or hydronephrosis. There is no ureteral calculus or ureterectasis on the left.  Right kidney is edematous with perinephric fluid inferiorly in Gerota's fascia on the right. There is moderately severe hydronephrosis on the right. There is a nonobstructing  1 mm calculus in the posterior mid right kidney. No renal mass is seen on the right. There is fluid surrounding the ureter at the ureteropelvic junction on the right. There is diffuse ureterectasis on the right. There is a 2 mm calculus just proximal to the ureterovesical junction on the right causing obstruction. No other ureteral calculi are appreciated.  In the pelvis, there is no mass or fluid collection. Appendix is absent. There is lipomatous infiltration of the ileocecal valve.  There is no bowel obstruction. No free air or portal venous air.  There is no ascites, adenopathy, or abscess in the abdomen or pelvis. Aorta is non aneurysmal. There is degenerative change in the lumbar spine. There are no blastic or lytic bone lesions.  IMPRESSION: Hydronephrosis right kidney, moderately severe due to a 2 mm calculus near the ureterovesical junction on the right. There is fluid surrounding the ureteropelvic junction as well as in the posterior aspect of Gerota's fascia on the right. There is a 1 mm nonobstructing calculus in the posterior mid right kidney.  The spleen, gallbladder, and appendix are absent.  No bowel obstruction or abscess.  There is lipomatous infiltration of the ileocecal valve.   Electronically Signed   By: Bretta Bang   On: 12/12/2012 18:50   1901  the patient's urinalysis is consistent with urinary tract infection. The CT scan shows a 2 mm stone near the ureter vesicle junction associated with hydronephrosis.  I will consult with urology MDM   1. Urinary tract infection   2. Kidney stone on right side    I discussed the case with Dr. Iona Coach.  Will start patient on Cipro. He will follow up with her in  the office closely. I have given the patient precautions to return to emergency room for fever worsening symptoms.     Celene Kras, MD 12/12/12 [phone]

## 2012-12-12 NOTE — ED Notes (Signed)
Bed: ZO10 Expected date:  Expected time:  Means of arrival:  Comments: EMS-kidney stones

## 2012-12-12 NOTE — ED Notes (Signed)
Pt states can not void at this time 

## 2012-12-15 LAB — URINE CULTURE: Colony Count: >=100000

## 2012-12-16 ENCOUNTER — Telehealth (HOSPITAL_COMMUNITY): Payer: Self-pay | Admitting: Emergency Medicine

## 2012-12-16 NOTE — ED Notes (Signed)
Post ED Visit - Positive Culture Follow-up  Culture report reviewed by antimicrobial stewardship pharmacist: [x]  Wes Dulaney, Pharm.D., BCPS []  Celedonio Miyamoto, Pharm.D., BCPS []  Georgina Pillion, Pharm.D., BCPS []  [place], [place].D., BCPS, AAHIVP []  Estella Husk, Pharm.D., BCPS, AAHIVP  Positive urine culture Treated with Cipro, organism sensitive to the same and no further patient follow-up is required at this time.  Kylie A Holland 12/16/2012, 5:54 PM

## 2012-12-17 DIAGNOSIS — N201 Calculus of ureter: Secondary | ICD-10-CM | POA: Diagnosis not present

## 2012-12-20 ENCOUNTER — Ambulatory Visit (INDEPENDENT_AMBULATORY_CARE_PROVIDER_SITE_OTHER): Payer: Medicare Other

## 2012-12-20 DIAGNOSIS — Z23 Encounter for immunization: Secondary | ICD-10-CM | POA: Diagnosis not present

## 2013-01-09 ENCOUNTER — Ambulatory Visit (INDEPENDENT_AMBULATORY_CARE_PROVIDER_SITE_OTHER): Payer: Medicare Other | Admitting: Family Medicine

## 2013-01-09 ENCOUNTER — Encounter: Payer: Self-pay | Admitting: Family Medicine

## 2013-01-09 VITALS — BP 112/64 | HR 63 | Temp 97.8°F | Wt 182.0 lb

## 2013-01-09 DIAGNOSIS — E669 Obesity, unspecified: Secondary | ICD-10-CM | POA: Insufficient documentation

## 2013-01-09 DIAGNOSIS — Z9884 Bariatric surgery status: Secondary | ICD-10-CM

## 2013-01-09 DIAGNOSIS — Z8543 Personal history of malignant neoplasm of ovary: Secondary | ICD-10-CM | POA: Diagnosis not present

## 2013-01-09 DIAGNOSIS — N200 Calculus of kidney: Secondary | ICD-10-CM | POA: Insufficient documentation

## 2013-01-09 DIAGNOSIS — M810 Age-related osteoporosis without current pathological fracture: Secondary | ICD-10-CM | POA: Insufficient documentation

## 2013-01-09 DIAGNOSIS — Z23 Encounter for immunization: Secondary | ICD-10-CM

## 2013-01-09 DIAGNOSIS — Z0000 Encounter for general adult medical examination without abnormal findings: Secondary | ICD-10-CM | POA: Diagnosis not present

## 2013-01-09 HISTORY — DX: Obesity, unspecified: E66.9

## 2013-01-09 HISTORY — DX: Calculus of kidney: N20.0

## 2013-01-09 HISTORY — DX: Age-related osteoporosis without current pathological fracture: M81.0

## 2013-01-09 LAB — CBC WITH DIFFERENTIAL/PLATELET
Basophils Absolute: 0 10*3/uL (ref 0.0–0.1)
Basophils Relative: 0.6 % (ref 0.0–3.0)
Eosinophils Absolute: 0.2 10*3/uL (ref 0.0–0.7)
Eosinophils Relative: 2.8 % (ref 0.0–5.0)
HCT: 38.1 % (ref 36.0–46.0)
Hemoglobin: 12.6 g/dL (ref 12.0–15.0)
Lymphocytes Relative: 28.1 % (ref 12.0–46.0)
Lymphs Abs: 2.1 10*3/uL (ref 0.7–4.0)
MCHC: 33.1 g/dL (ref 30.0–36.0)
MCV: 95.7 fl (ref 78.0–100.0)
Monocytes Absolute: 0.7 10*3/uL (ref 0.1–1.0)
Monocytes Relative: 8.7 % (ref 3.0–12.0)
Neutro Abs: 4.5 10*3/uL (ref 1.4–7.7)
Neutrophils Relative %: 59.8 % (ref 43.0–77.0)
Platelets: 306 10*3/uL (ref 150.0–400.0)
RBC: 3.98 Mil/uL (ref 3.87–5.11)
RDW: 13 % (ref 11.5–14.6)
WBC: 7.6 10*3/uL (ref 4.5–10.5)

## 2013-01-09 LAB — HEPATIC FUNCTION PANEL
ALT: 17 U/L (ref 0–35)
AST: 21 U/L (ref 0–37)
Albumin: 3.8 g/dL (ref 3.5–5.2)
Alkaline Phosphatase: 84 U/L (ref 39–117)
Bilirubin, Direct: 0 mg/dL (ref 0.0–0.3)
Total Bilirubin: 0.7 mg/dL (ref 0.3–1.2)
Total Protein: 7.4 g/dL (ref 6.0–8.3)

## 2013-01-09 LAB — BASIC METABOLIC PANEL
BUN: 17 mg/dL (ref 6–23)
CO2: 25 mEq/L (ref 19–32)
Calcium: 9.1 mg/dL (ref 8.4–10.5)
Chloride: 107 mEq/L (ref 96–112)
Creatinine, Ser: 0.9 mg/dL (ref 0.4–1.2)
GFR: 63.42 mL/min (ref >60.00)
Glucose, Bld: 91 mg/dL (ref 70–99)
Potassium: 3.8 mEq/L (ref 3.5–5.1)
Sodium: 142 mEq/L (ref 135–145)

## 2013-01-09 NOTE — Patient Instructions (Signed)
Continue yearly flu vaccine We will call you regarding bone density scan. Continue regular weightbearing exercise and calcium 1200 mg daily and vitamin D 1000 international units daily

## 2013-01-09 NOTE — Progress Notes (Signed)
  Subjective:    Patient ID: [name], female    DOB: Nov 03, 1945, 67 y.o.   MRN: [id number]  HPI Patient is seen for wellness evaluation. She has past history of ovarian cancer back in 2007. She's had stable CA 125 levels for several years now. She has been released from care of oncologist. She's had some recurrent kidney stones during the past year and has seen urologist. These were calcium stones. She has no other significant chronic medical problems.  She's had previous splenectomy which was following injury from her surgery related to ovarian cancer. Last Pneumovax was 7 years ago prior to turning age 80. She already had flu vaccine. Other immunizations up-to-date. Last bone density scan several years ago-?hx of osteoporosis.  Past Medical History  Diagnosis Date  . VIRAL URI 03/06/2009  . Cervicalgia 05/19/2009  . WEIGHT GAIN 06/30/2008  . Headache(784.0) 05/19/2009  . SPRAIN AND STRAIN OF UNSPECIFIED SITE OF HAND 05/19/2009  . NEOPLASM, MALIGNANT, OVARY, HX OF 06/30/2008   Past Surgical History  Procedure Laterality Date  . Appendectomy    . Abdominal hysterectomy    . Ovarian cancer  2007  . Cesarean section    . Splenectomy  2007  . Gastric bypass  1979  . Transurethral resection of bladder  2007    LESION  . Renal calculi    . Cholecystectomy  2011    reports that she has never smoked. She does not have any smokeless tobacco history on file. She reports that she does not drink alcohol or use illicit drugs. family history includes Arthritis in her mother and other; Cancer in her sister; Cancer (age of onset: 110) in her father. Allergies  Allergen Reactions  . Morphine And Related Nausea And Vomiting  . Penicillins Hives      Review of Systems  Constitutional: Negative for appetite change and unexpected weight change.  Respiratory: Negative for cough and shortness of breath.   Cardiovascular: Negative for chest pain.  Gastrointestinal: Negative for abdominal  pain.  Endocrine: Negative for polydipsia and polyuria.  Genitourinary: Negative for dysuria.  Musculoskeletal: Positive for arthralgias.       Objective:   Physical Exam  Constitutional: She is oriented to person, place, and time. She appears well-developed and well-nourished.  HENT:  Right Ear: External ear normal.  Left Ear: External ear normal.  Mouth/Throat: Oropharynx is clear and moist.  Neck: Neck supple. No thyromegaly present.  Cardiovascular: Normal rate and regular rhythm.   Pulmonary/Chest: Effort normal and breath sounds normal. No respiratory distress. She has no wheezes. She has no rales.  Genitourinary:  Deferred per gyn.  Musculoskeletal: She exhibits no edema.  Neurological: She is alert and oriented to person, place, and time.  Psychiatric: She has a normal mood and affect.          Assessment & Plan:  Health maintenance. Flu vaccine already given. She needs Pneumovax booster. Obtain screening labs include CA 125. Schedule bone density scan. Mammogram up-to-date. Colonoscopy up to date.  Discussed weight loss and need for ongoing weight bearing exercise.

## 2013-01-10 LAB — VITAMIN D 25 HYDROXY (VIT D DEFICIENCY, FRACTURES): Vit D, 25-Hydroxy: 43 ng/mL (ref 30–89)

## 2013-01-10 LAB — CA 125: CA 125: 5.7 U/mL (ref 0.0–30.2)

## 2013-01-16 ENCOUNTER — Other Ambulatory Visit: Payer: Self-pay | Admitting: Family Medicine

## 2013-01-16 DIAGNOSIS — M810 Age-related osteoporosis without current pathological fracture: Secondary | ICD-10-CM

## 2013-02-08 ENCOUNTER — Ambulatory Visit (INDEPENDENT_AMBULATORY_CARE_PROVIDER_SITE_OTHER)
Admission: RE | Admit: 2013-02-08 | Discharge: 2013-02-08 | Disposition: A | Discharge status: Home / Self Care | Payer: Medicare Other | Source: Ambulatory Visit | Attending: Family Medicine | Admitting: Family Medicine

## 2013-02-08 DIAGNOSIS — M810 Age-related osteoporosis without current pathological fracture: Secondary | ICD-10-CM

## 2013-02-23 ENCOUNTER — Other Ambulatory Visit: Payer: Self-pay | Admitting: Family Medicine

## 2013-02-25 NOTE — Telephone Encounter (Signed)
Last refill 07/29/12

## 2013-02-25 NOTE — Telephone Encounter (Signed)
Refill for 6 months. 

## 2013-03-14 ENCOUNTER — Ambulatory Visit: Payer: Self-pay | Admitting: Obstetrics & Gynecology

## 2013-03-18 ENCOUNTER — Ambulatory Visit: Payer: Self-pay | Admitting: Obstetrics & Gynecology

## 2013-03-27 ENCOUNTER — Encounter: Payer: Self-pay | Admitting: Internal Medicine

## 2013-03-27 ENCOUNTER — Ambulatory Visit (INDEPENDENT_AMBULATORY_CARE_PROVIDER_SITE_OTHER): Payer: Medicare Other | Admitting: Internal Medicine

## 2013-03-27 VITALS — BP 113/71 | HR 83 | Temp 98.8°F | Wt 181.4 lb

## 2013-03-27 DIAGNOSIS — Z87898 Personal history of other specified conditions: Secondary | ICD-10-CM | POA: Insufficient documentation

## 2013-03-27 DIAGNOSIS — Z862 Personal history of diseases of the blood and blood-forming organs and certain disorders involving the immune mechanism: Secondary | ICD-10-CM

## 2013-03-27 DIAGNOSIS — J209 Acute bronchitis, unspecified: Secondary | ICD-10-CM

## 2013-03-27 HISTORY — DX: Personal history of other specified conditions: Z87.898

## 2013-03-27 MED ORDER — AZITHROMYCIN 250 MG PO TABS: ORAL_TABLET | ORAL | Status: DC

## 2013-03-27 MED ORDER — HYDROCOD POLST-CHLORPHEN POLST 10-8 MG/5ML PO LQCR: 5.0000 mL | Freq: Two times a day (BID) | ORAL | Status: DC | PRN

## 2013-03-27 NOTE — Progress Notes (Signed)
Pre visit review using our clinic review tool, if applicable. No additional management support is needed unless otherwise documented below in the visit note. 

## 2013-03-27 NOTE — Progress Notes (Signed)
   Subjective:    Patient ID: [name], female    DOB: March 05, 1946, 67 y.o.   MRN: [id number]  HPI  Her symptoms began 03/24/13 as a rattly cough. She's also had associated itchy, watery eyes. She describes discomfort in the frontal sinus area particularly with coughing. She also had some discomfort with deep breathing. Wheezing is associated with the cough.  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  OTCs of no benefit.  She has no history of asthma and  never smoked.  Significant history reveals splenectomy following definitive surgery for ovarian cancer which had involved the omentum 2007.  Review of Systems   She denies pelvic pain or otic discharge. She also has no facial pain or nasal purulence. All nasal secretions are clear.     Objective:   Physical Exam General appearance:good health ;well nourished; no acute distress or increased work of breathing is present.  No  lymphadenopathy about the head, neck, or axilla noted.   Eyes: No conjunctival inflammation or lid edema is present. .  Ears:  External ear exam shows no significant lesions or deformities.  Otoscopic examination reveals clear canals, tympanic membranes are intact bilaterally without bulging, retraction, inflammation or discharge.  Nose:  External nasal examination shows no deformity or inflammation. Nasal mucosa are dry  without lesions or exudates. No septal dislocation or deviation.No obstruction to airflow.   Oral exam: Dental hygiene is good; lips and gums are healthy appearing.There is no oropharyngeal erythema or exudate noted.   Neck:  No deformities,  masses, or tenderness noted.     Heart:  Normal rate and regular rhythm. S1 and S2 normal without gallop, murmur, click, rub or other extra sounds.   Lungs:Chest clear to auscultation; no wheezes, rhonchi,rales ,or rubs present.No increased work of breathing.  Brassy  nonproductive cough  Extremities:  No cyanosis, edema, or clubbing  noted    Skin: Warm & dry w/o jaundice          Assessment & Plan:  #1 acute bronchitis w/o bronchospasm clinically  Plan: See orders and recommendations

## 2013-03-27 NOTE — Patient Instructions (Signed)

## 2013-04-04 ENCOUNTER — Ambulatory Visit (INDEPENDENT_AMBULATORY_CARE_PROVIDER_SITE_OTHER): Payer: Medicare Other | Admitting: Obstetrics & Gynecology

## 2013-04-04 ENCOUNTER — Encounter: Payer: Self-pay | Admitting: Obstetrics & Gynecology

## 2013-04-04 VITALS — BP 125/82 | HR 66 | Temp 98.2°F | Ht 62.0 in | Wt 181.0 lb

## 2013-04-04 DIAGNOSIS — Z01419 Encounter for gynecological examination (general) (routine) without abnormal findings: Secondary | ICD-10-CM | POA: Diagnosis not present

## 2013-04-04 NOTE — Progress Notes (Signed)
  Subjective:    [name] is a 68 y.o. female who presents for an annual exam. The patient has no complaints today. The patient is not sexually active. GYN screening history: last pap: approximate date 02/2012 and was normal and last mammogram: approximate date 11/2012 and was normal. The patient wears seatbelts: yes. The patient participates in regular exercise: yes. Has the patient ever been transfused or tattooed?: no.   Menstrual History: OB History   Grav Para Term Preterm Abortions TAB SAB Ect Mult Living   3 2 2  1  1   2      No LMP recorded. Patient has had a hysterectomy.    The following portions of the patient's history were reviewed and updated as appropriate: allergies, current medications, past family history, past medical history, past social history, past surgical history and problem list.  Review of Systems Pertinent items are noted in HPI.    Objective:      General appearance: alert Breasts: normal appearance, no masses or tenderness Abdomen: soft, non-tender; bowel sounds normal; no masses,  no organomegaly Pelvic: external genitalia normal, no adnexal masses or tenderness and vagina normal without discharge    Assessment:    Healthy female exam.    Plan:     Return prn

## 2013-04-05 NOTE — Patient Instructions (Signed)

## 2013-09-05 ENCOUNTER — Other Ambulatory Visit: Payer: Self-pay | Admitting: Family Medicine

## 2013-09-05 NOTE — Telephone Encounter (Signed)
Last visit 01/09/13 Last refill 02/23/13 #30 5 refill

## 2013-09-05 NOTE — Telephone Encounter (Signed)
Refill for 3 months. 

## 2013-09-25 DIAGNOSIS — Z124 Encounter for screening for malignant neoplasm of cervix: Secondary | ICD-10-CM | POA: Diagnosis not present

## 2013-09-25 DIAGNOSIS — Z01419 Encounter for gynecological examination (general) (routine) without abnormal findings: Secondary | ICD-10-CM | POA: Diagnosis not present

## 2013-09-25 DIAGNOSIS — C569 Malignant neoplasm of unspecified ovary: Secondary | ICD-10-CM | POA: Diagnosis not present

## 2013-09-30 ENCOUNTER — Telehealth: Payer: Self-pay | Admitting: Genetic Counselor

## 2013-09-30 NOTE — Telephone Encounter (Signed)
LEFT MESSAGE FOR PATIENT TO RETURN CALL TO SCHEDULE GENETIC APPT.  °

## 2013-10-02 DIAGNOSIS — M2620 Unspecified anomaly of dental arch relationship: Secondary | ICD-10-CM | POA: Diagnosis not present

## 2013-10-02 DIAGNOSIS — M19079 Primary osteoarthritis, unspecified ankle and foot: Secondary | ICD-10-CM | POA: Diagnosis not present

## 2013-10-02 DIAGNOSIS — M79609 Pain in unspecified limb: Secondary | ICD-10-CM | POA: Diagnosis not present

## 2013-10-04 ENCOUNTER — Other Ambulatory Visit: Payer: Self-pay | Admitting: Obstetrics & Gynecology

## 2013-10-04 DIAGNOSIS — Z8543 Personal history of malignant neoplasm of ovary: Principal | ICD-10-CM

## 2013-10-04 DIAGNOSIS — Z08 Encounter for follow-up examination after completed treatment for malignant neoplasm: Secondary | ICD-10-CM

## 2013-10-16 DIAGNOSIS — L821 Other seborrheic keratosis: Secondary | ICD-10-CM | POA: Diagnosis not present

## 2013-10-16 DIAGNOSIS — M19079 Primary osteoarthritis, unspecified ankle and foot: Secondary | ICD-10-CM | POA: Diagnosis not present

## 2013-10-16 DIAGNOSIS — L819 Disorder of pigmentation, unspecified: Secondary | ICD-10-CM | POA: Diagnosis not present

## 2013-10-16 DIAGNOSIS — L570 Actinic keratosis: Secondary | ICD-10-CM | POA: Diagnosis not present

## 2013-10-16 DIAGNOSIS — M2020 Hallux rigidus, unspecified foot: Secondary | ICD-10-CM | POA: Diagnosis not present

## 2013-10-16 DIAGNOSIS — M79609 Pain in unspecified limb: Secondary | ICD-10-CM | POA: Diagnosis not present

## 2013-10-17 ENCOUNTER — Other Ambulatory Visit: Payer: Medicare Other

## 2013-10-30 ENCOUNTER — Other Ambulatory Visit: Payer: Medicare Other

## 2013-12-05 ENCOUNTER — Encounter: Payer: Self-pay | Admitting: Family Medicine

## 2013-12-05 ENCOUNTER — Ambulatory Visit (INDEPENDENT_AMBULATORY_CARE_PROVIDER_SITE_OTHER): Payer: Medicare Other | Admitting: Family Medicine

## 2013-12-05 VITALS — BP 128/70 | HR 78 | Temp 98.1°F | Wt 187.0 lb

## 2013-12-05 DIAGNOSIS — J069 Acute upper respiratory infection, unspecified: Secondary | ICD-10-CM

## 2013-12-05 DIAGNOSIS — B9789 Other viral agents as the cause of diseases classified elsewhere: Principal | ICD-10-CM

## 2013-12-05 MED ORDER — HYDROCOD POLST-CHLORPHEN POLST 10-8 MG/5ML PO LQCR: 5.0000 mL | Freq: Two times a day (BID) | ORAL | Status: DC | PRN

## 2013-12-05 NOTE — Progress Notes (Signed)
Pre visit review using our clinic review tool, if applicable. No additional management support is needed unless otherwise documented below in the visit note. 

## 2013-12-05 NOTE — Progress Notes (Signed)
   Subjective:    Patient ID: [name], female    DOB: [birth year], 68 y.o.   MRN: [id number]  Cough Pertinent negatives include no chest pain, chills or fever.   Patient is seen with cough for the past couple days. Started with sore throat and nasal congestion now has mostly dry cough. No fever. She's had some intermittent headaches and malaise. Allergy to penicillin.  Nonsmoker  Past Medical History  Diagnosis Date  . VIRAL URI 03/06/2009  . Cervicalgia 05/19/2009  . WEIGHT GAIN 06/30/2008  . Headache(784.0) 05/19/2009  . SPRAIN AND STRAIN OF UNSPECIFIED SITE OF HAND 05/19/2009  . NEOPLASM, MALIGNANT, OVARY, HX OF 06/30/2008   Past Surgical History  Procedure Laterality Date  . Appendectomy    . Abdominal hysterectomy    . Ovarian cancer  2007  . Cesarean section    . Splenectomy  2007  . Gastric bypass  1979  . Transurethral resection of bladder  2007    LESION  . Renal calculi    . Cholecystectomy  2011    reports that she has never smoked. She does not have any smokeless tobacco history on file. She reports that she does not drink alcohol or use illicit drugs. family history includes Arthritis in her mother and other; Cancer in her sister; Cancer (age of onset: 78) in her father; Diabetes in her sister. Allergies  Allergen Reactions  . Morphine And Related Nausea And Vomiting  . Penicillins Hives      Review of Systems  Constitutional: Positive for fatigue. Negative for fever and chills.  HENT: Positive for congestion.   Respiratory: Positive for cough.   Cardiovascular: Negative for chest pain.       Objective:   Physical Exam  Constitutional: She appears well-developed and well-nourished.  HENT:  Right Ear: External ear normal.  Left Ear: External ear normal.  Mouth/Throat: Oropharynx is clear and moist.  Neck: Neck supple. No thyromegaly present.  Cardiovascular: Normal rate and regular rhythm.   Pulmonary/Chest: Effort normal and breath sounds normal.  No respiratory distress. She has no wheezes. She has no rales.          Assessment & Plan:  Cough. Suspect viral syndrome. Treat symptomatically. Patient requesting refill Tussionex to take at night. 1 teaspoon each bedtime when necessary for severe cough. Followup promptly for fever.

## 2013-12-05 NOTE — Patient Instructions (Signed)

## 2013-12-31 ENCOUNTER — Ambulatory Visit (INDEPENDENT_AMBULATORY_CARE_PROVIDER_SITE_OTHER): Payer: Medicare Other

## 2013-12-31 DIAGNOSIS — Z23 Encounter for immunization: Secondary | ICD-10-CM | POA: Diagnosis not present

## 2014-01-10 ENCOUNTER — Ambulatory Visit (INDEPENDENT_AMBULATORY_CARE_PROVIDER_SITE_OTHER): Payer: Medicare Other | Admitting: Family Medicine

## 2014-01-10 ENCOUNTER — Encounter: Payer: Self-pay | Admitting: Family Medicine

## 2014-01-10 VITALS — BP 120/80 | Temp 98.1°F | Wt 180.0 lb

## 2014-01-10 DIAGNOSIS — R0789 Other chest pain: Secondary | ICD-10-CM | POA: Diagnosis not present

## 2014-01-10 DIAGNOSIS — L304 Erythema intertrigo: Secondary | ICD-10-CM | POA: Diagnosis not present

## 2014-01-10 DIAGNOSIS — T148XXA Other injury of unspecified body region, initial encounter: Secondary | ICD-10-CM

## 2014-01-10 NOTE — Progress Notes (Signed)
Pre visit review using our clinic review tool, if applicable. No additional management support is needed unless otherwise documented below in the visit note. 

## 2014-01-10 NOTE — Patient Instructions (Signed)
clotrimazole cream from over the counter twice daily to area under breast - keep area clean and dry  Get the xray of the ribs  Heat and tylenol for pain if needed and gentle stretching  Follow up if any worsening or if symptoms persist

## 2014-01-10 NOTE — Progress Notes (Signed)
Chief Complaint  Patient presents with  . Rash    HPI:  Acute visit for 2 new problems:  1) Rash: -under breasts bilat -reports: started 3 days ago, itchy -has tried hydrocortisone cream Denies: fevers, vomiting, malaise, painful rash  2)L lower and mid back pain: -after trip over dog and caught self on wall and fell on L side last week -improving but still has some pain with certain movements and if takes a really big breath in -denies: fevers, SOB, cough, weakness or numbness -taking 400-600mg  every 4 hours for a few days, not taking this now  ROS: See pertinent positives and negatives per HPI.  Past Medical History  Diagnosis Date  . VIRAL URI 03/06/2009  . Cervicalgia 05/19/2009  . WEIGHT GAIN 06/30/2008  . Headache(784.0) 05/19/2009  . SPRAIN AND STRAIN OF UNSPECIFIED SITE OF HAND 05/19/2009  . NEOPLASM, MALIGNANT, OVARY, HX OF 06/30/2008    Past Surgical History  Procedure Laterality Date  . Appendectomy    . Abdominal hysterectomy    . Ovarian cancer  2007  . Cesarean section    . Splenectomy  2007  . Gastric bypass  1979  . Transurethral resection of bladder  2007    LESION  . Renal calculi    . Cholecystectomy  2011    Family History  Problem Relation Age of Onset  . Arthritis Other   . Arthritis Mother     ?osteoarthritis  . Cancer Father 74    lymphoma  . Cancer Sister     ovaian cancer  . Diabetes Sister     type 2     History   Social History  . Marital Status: Married    Spouse Name: N/A    Number of Children: N/A  . Years of Education: N/A   Social History Main Topics  . Smoking status: Never Smoker   . Smokeless tobacco: None  . Alcohol Use: No  . Drug Use: No  . Sexual Activity: Not Currently   Other Topics Concern  . None   Social History Narrative  . None    Current outpatient prescriptions:calcium carbonate (OS-CAL - DOSED IN MG OF ELEMENTAL CALCIUM) 1250 MG tablet, Take 2 tablets by mouth daily. , Disp: , Rfl: ;   ibuprofen (ADVIL,MOTRIN) 200 MG tablet, Take 400 mg by mouth every 6 (six) hours as needed for pain., Disp: , Rfl: ;  Multiple Vitamin (MULTIVITAMIN) tablet, Take 1 tablet by mouth daily. , Disp: , Rfl:  zolpidem (AMBIEN) 10 MG tablet, TAKE 1 TABLET BY MOUTH AT BEDTIME, Disp: 30 tablet, Rfl: 2  EXAM:  Filed Vitals:   01/10/14 0812  BP: 120/80  Temp: 98.1 F (36.7 C)    Body mass index is 32.91 kg/(m^2).  GENERAL: vitals reviewed and listed above, alert, oriented, appears well hydrated and in no acute distress  HEENT: atraumatic, conjunttiva clear, no obvious abnormalities on inspection of external nose and ears  NECK: no obvious masses on inspection  SKIN: area of erythema with peripheral fine scale located in bilat inframammary folds and R inguinal region; no maceration or weeping  MS: moves all extremities without noticeable abnormality; no ecchymosis or swelling of L lateral abd, chest wall or back, TTP L posterolat lower chest wall in soft tissues and lower ribs  PSYCH: pleasant and cooperative, no obvious depression or anxiety  ASSESSMENT AND PLAN:  Discussed the following assessment and plan:  Intertrigo -discussed causes, symptoms, prevention and treatment, follow up if does not resolve  with tx  Contusion of soft tissue - Plan: DG Chest 2 View -doubt rib fx or underlying hematoma or damage to lung but advise xray to further evaluation rib pain, conservative tx, follow up with if worsens or persists - discussed safe dosing for OTC analgesics - she is not requiring these any longer  -Patient advised to return or notify a doctor immediately if symptoms worsen or persist or new concerns arise.  Patient Instructions  clotrimazole cream from over the counter twice daily to area under breast - keep area clean and dry  Get the xray of the ribs  Heat and tylenol for pain if needed and gentle stretching  Follow up if any worsening or if symptoms persist       [name],  HANNAH R.

## 2014-01-13 ENCOUNTER — Other Ambulatory Visit: Payer: Self-pay

## 2014-01-13 DIAGNOSIS — Z1231 Encounter for screening mammogram for malignant neoplasm of breast: Secondary | ICD-10-CM

## 2014-01-14 ENCOUNTER — Ambulatory Visit (INDEPENDENT_AMBULATORY_CARE_PROVIDER_SITE_OTHER)
Admission: RE | Admit: 2014-01-14 | Discharge: 2014-01-14 | Disposition: A | Discharge status: Home / Self Care | Payer: Medicare Other | Source: Ambulatory Visit | Attending: Family Medicine | Admitting: Family Medicine

## 2014-01-14 DIAGNOSIS — S299XXA Unspecified injury of thorax, initial encounter: Secondary | ICD-10-CM | POA: Diagnosis not present

## 2014-01-14 DIAGNOSIS — R109 Unspecified abdominal pain: Secondary | ICD-10-CM | POA: Diagnosis not present

## 2014-01-14 DIAGNOSIS — T148XXA Other injury of unspecified body region, initial encounter: Secondary | ICD-10-CM

## 2014-01-14 DIAGNOSIS — T148 Other injury of unspecified body region: Secondary | ICD-10-CM | POA: Diagnosis not present

## 2014-01-16 ENCOUNTER — Telehealth: Payer: Self-pay | Admitting: *Deleted

## 2014-01-16 NOTE — Telephone Encounter (Signed)
Error

## 2014-01-17 ENCOUNTER — Telehealth: Payer: Self-pay | Admitting: Family Medicine

## 2014-01-17 NOTE — Telephone Encounter (Signed)
LM on answering machine (home and cell) to call back if any questions.

## 2014-01-27 ENCOUNTER — Encounter: Payer: Self-pay | Admitting: Family Medicine

## 2014-02-03 ENCOUNTER — Other Ambulatory Visit: Payer: Self-pay | Admitting: Family Medicine

## 2014-02-04 NOTE — Telephone Encounter (Signed)
Last visit 01/10/14 Last refill 09/05/13 #30 2 refill

## 2014-02-05 NOTE — Telephone Encounter (Signed)
Refill OK with 2 additional refills. 

## 2014-02-06 ENCOUNTER — Ambulatory Visit: Payer: Medicare Other

## 2014-02-25 ENCOUNTER — Ambulatory Visit: Payer: Medicare Other

## 2014-02-28 DIAGNOSIS — M2031 Hallux varus (acquired), right foot: Secondary | ICD-10-CM | POA: Diagnosis not present

## 2014-02-28 DIAGNOSIS — M2021 Hallux rigidus, right foot: Secondary | ICD-10-CM | POA: Diagnosis not present

## 2014-03-18 ENCOUNTER — Ambulatory Visit
Admission: RE | Admit: 2014-03-18 | Discharge: 2014-03-18 | Disposition: A | Discharge status: Home / Self Care | Payer: Medicare Other | Source: Ambulatory Visit

## 2014-03-18 DIAGNOSIS — Z1231 Encounter for screening mammogram for malignant neoplasm of breast: Secondary | ICD-10-CM | POA: Diagnosis not present

## 2014-03-24 ENCOUNTER — Encounter: Payer: Self-pay | Admitting: *Deleted

## 2014-03-26 ENCOUNTER — Telehealth: Payer: Self-pay | Admitting: Family Medicine

## 2014-03-26 NOTE — Telephone Encounter (Signed)
Pt request refill zolpidem (AMBIEN) 10 MG tablet  #30 Case no [id number] [facility] Pt has made appt 04/04/14 at 9am

## 2014-03-27 MED ORDER — ZOLPIDEM TARTRATE 10 MG PO TABS: ORAL_TABLET | ORAL | Status: DC

## 2014-03-27 NOTE — Telephone Encounter (Signed)
Refills OK. 

## 2014-03-27 NOTE — Telephone Encounter (Signed)
Rx called in to pharmacy. 

## 2014-03-27 NOTE — Telephone Encounter (Signed)
Last visit 12/05/13 Last refill 02/05/14 #30 2 refill

## 2014-03-28 DIAGNOSIS — Z8619 Personal history of other infectious and parasitic diseases: Secondary | ICD-10-CM

## 2014-03-28 HISTORY — DX: Personal history of other infectious and parasitic diseases: Z86.19

## 2014-04-04 ENCOUNTER — Encounter: Payer: Self-pay | Admitting: Family Medicine

## 2014-04-04 ENCOUNTER — Ambulatory Visit (INDEPENDENT_AMBULATORY_CARE_PROVIDER_SITE_OTHER): Payer: Medicare Other | Admitting: Family Medicine

## 2014-04-04 ENCOUNTER — Telehealth: Payer: Self-pay

## 2014-04-04 VITALS — BP 120/80 | HR 68 | Temp 97.9°F | Ht 61.5 in | Wt 177.0 lb

## 2014-04-04 DIAGNOSIS — Z23 Encounter for immunization: Secondary | ICD-10-CM | POA: Diagnosis not present

## 2014-04-04 DIAGNOSIS — Z8543 Personal history of malignant neoplasm of ovary: Secondary | ICD-10-CM | POA: Diagnosis not present

## 2014-04-04 DIAGNOSIS — M150 Primary generalized (osteo)arthritis: Secondary | ICD-10-CM | POA: Diagnosis not present

## 2014-04-04 DIAGNOSIS — Z0000 Encounter for general adult medical examination without abnormal findings: Secondary | ICD-10-CM | POA: Diagnosis not present

## 2014-04-04 DIAGNOSIS — F5104 Psychophysiologic insomnia: Secondary | ICD-10-CM

## 2014-04-04 DIAGNOSIS — G4700 Insomnia, unspecified: Secondary | ICD-10-CM | POA: Diagnosis not present

## 2014-04-04 DIAGNOSIS — M159 Polyosteoarthritis, unspecified: Secondary | ICD-10-CM

## 2014-04-04 DIAGNOSIS — M8949 Other hypertrophic osteoarthropathy, multiple sites: Secondary | ICD-10-CM

## 2014-04-04 LAB — COMPREHENSIVE METABOLIC PANEL
ALT: 20 U/L (ref 0–35)
AST: 26 U/L (ref 0–37)
Albumin: 4 g/dL (ref 3.5–5.2)
Alkaline Phosphatase: 100 U/L (ref 39–117)
BUN: 11 mg/dL (ref 6–23)
CO2: 26 mEq/L (ref 19–32)
Calcium: 9.2 mg/dL (ref 8.4–10.5)
Chloride: 110 mEq/L (ref 96–112)
Creatinine, Ser: 1 mg/dL (ref 0.4–1.2)
GFR: 58.82 mL/min — ABNORMAL LOW (ref >60.00)
Glucose, Bld: 86 mg/dL (ref 70–99)
Potassium: 4.2 mEq/L (ref 3.5–5.1)
Sodium: 143 mEq/L (ref 135–145)
Total Bilirubin: 0.9 mg/dL (ref 0.2–1.2)
Total Protein: 7.3 g/dL (ref 6.0–8.3)

## 2014-04-04 LAB — CBC WITH DIFFERENTIAL/PLATELET
Basophils Absolute: 0.1 10*3/uL (ref 0.0–0.1)
Basophils Relative: 0.9 % (ref 0.0–3.0)
Eosinophils Absolute: 0.2 10*3/uL (ref 0.0–0.7)
Eosinophils Relative: 2.8 % (ref 0.0–5.0)
HCT: 40.6 % (ref 36.0–46.0)
Hemoglobin: 12.9 g/dL (ref 12.0–15.0)
Lymphocytes Relative: 25.7 % (ref 12.0–46.0)
Lymphs Abs: 2.1 10*3/uL (ref 0.7–4.0)
MCHC: 31.9 g/dL (ref 30.0–36.0)
MCV: 97.1 fl (ref 78.0–100.0)
Monocytes Absolute: 0.7 10*3/uL (ref 0.1–1.0)
Monocytes Relative: 8.4 % (ref 3.0–12.0)
Neutro Abs: 5.1 10*3/uL (ref 1.4–7.7)
Neutrophils Relative %: 62.2 % (ref 43.0–77.0)
Platelets: 351 10*3/uL (ref 150.0–400.0)
RBC: 4.18 Mil/uL (ref 3.87–5.11)
RDW: 14.1 % (ref 11.5–15.5)
WBC: 8.2 10*3/uL (ref 4.0–10.5)

## 2014-04-04 NOTE — Progress Notes (Signed)
Subjective:    Patient ID: [name], female    DOB: 12-20-1945, 69 y.o.   MRN: [id number]  HPI   Patient seen for Medicare wellness exam and medical follow-up.  She had ovarian cancer several years ago and has been in remission for several years. Her other medical problems include history of obesity, osteopenia, history of recurrent kidney stones, chronic insomnia, osteoarthritis. She's had ongoing issues with her knees and especially her right foot. She has seen orthopedist previously. She was told she had significant degenerative changes of the foot.  She is on chronic Ambien. Needs prior authorization. No history of Prevnar 13. Other immunizations are up-to-date. She is taking no prescription medications other than as needed Ambien.  She has gotten yearly CA 125 levels and is requesting the same. Generally feels well other than her arthritis complaints. She still sees gynecologist. Colonoscopy up-to-date. Mammogram up-to-date.  Reviewed with no changes: Past Medical History  Diagnosis Date  . VIRAL URI 03/06/2009  . Cervicalgia 05/19/2009  . WEIGHT GAIN 06/30/2008  . Headache(784.0) 05/19/2009  . SPRAIN AND STRAIN OF UNSPECIFIED SITE OF HAND 05/19/2009  . NEOPLASM, MALIGNANT, OVARY, HX OF 06/30/2008   Past Surgical History  Procedure Laterality Date  . Appendectomy    . Abdominal hysterectomy    . Ovarian cancer  2007  . Cesarean section    . Splenectomy  2007  . Gastric bypass  1979  . Transurethral resection of bladder  2007    LESION  . Renal calculi    . Cholecystectomy  2011    reports that she has never smoked. She does not have any smokeless tobacco history on file. She reports that she does not drink alcohol or use illicit drugs. family history includes Arthritis in her mother and other; Cancer in her sister; Cancer (age of onset: 80) in her father; Diabetes in her sister. Allergies  Allergen Reactions  . Morphine And Related Nausea And Vomiting  . Penicillins  Hives   1.  Risk factors based on Past Medical , Social, and Family history reviewed and as indicated above with no changes 2.  Limitations in physical activities None.  No recent falls. 3.  Depression/mood No active depression or anxiety issues 4.  Hearing No defiits 5.  ADLs independent in all. 6.  Cognitive function (orientation to time and place, language, writing, speech,memory) no short or long term memory issues.  Language and judgement intact. 7.  Home Safety no issues 8.  Height, weight, and visual acuity.all stable. 9.  Counseling discussed weight loss, alternative exercises with her knee arthritis such as elliptical, and ongoing calcium and vitamin D 10. Recommendation of preventive services.Prevnar 13 11. Labs based on risk factors CA 125, CBC, comprehensive metabolic panel 12. Care Plan as above 13. Other Providers OB/GYN 14. Written schedule of screening/prevention services given to patient.    Review of Systems  Constitutional: Negative for fever, activity change, appetite change, fatigue and unexpected weight change.  HENT: Negative for ear pain, hearing loss, sore throat and trouble swallowing.   Eyes: Negative for visual disturbance.  Respiratory: Negative for cough and shortness of breath.   Cardiovascular: Negative for chest pain and palpitations.  Gastrointestinal: Negative for abdominal pain, diarrhea, constipation and blood in stool.  Endocrine: Negative for polydipsia and polyuria.  Genitourinary: Negative for dysuria and hematuria.  Musculoskeletal: Positive for arthralgias. Negative for myalgias and back pain.  Skin: Negative for rash.  Neurological: Negative for dizziness, syncope and headaches.  Hematological:  Negative for adenopathy.  Psychiatric/Behavioral: Negative for confusion and dysphoric mood.       Objective:   Physical Exam  Constitutional: She is oriented to person, place, and time. She appears well-developed and well-nourished.  HENT:    Head: Normocephalic and atraumatic.  Eyes: EOM are normal. Pupils are equal, round, and reactive to light.  Neck: Normal range of motion. Neck supple. No thyromegaly present.  Cardiovascular: Normal rate, regular rhythm and normal heart sounds.   No murmur heard. Pulmonary/Chest: Breath sounds normal. No respiratory distress. She has no wheezes. She has no rales.  Abdominal: Soft. Bowel sounds are normal. She exhibits no distension and no mass. There is no tenderness. There is no rebound and no guarding.  Genitourinary:  Per gyn  Musculoskeletal: Normal range of motion. She exhibits no edema.  Lymphadenopathy:    She has no cervical adenopathy.  Neurological: She is alert and oriented to person, place, and time. She displays normal reflexes. No cranial nerve deficit.  Skin: No rash noted.  Psychiatric: She has a normal mood and affect. Her behavior is normal. Judgment and thought content normal.          Assessment & Plan:  #1 health maintenance. Prevnar 13 given. Other immunizations up-to-date. She will continue GYN follow-up. Colonoscopy up-to-date. #2 remote history of ovarian cancer. Patient requesting CA 125. Also check CBC and comprehensive metabolic panel  #3 chronic insomnia. Sleep hygiene discussed. Continue as needed Ambien #4 history of advanced osteoarthritis. Discussed appropriate exercises. Work on weight loss. Handicap sticker given

## 2014-04-04 NOTE — Telephone Encounter (Signed)
PA was approved. #[id number] qty 30 per 30 days

## 2014-04-04 NOTE — Progress Notes (Signed)
Pre visit review using our clinic review tool, if applicable. No additional management support is needed unless otherwise documented below in the visit note. 

## 2014-04-04 NOTE — Telephone Encounter (Signed)
Can you please do a PA for Ambien she needs to get #30 per month instead of #15

## 2014-04-04 NOTE — Patient Instructions (Signed)
Continue yearly flu vaccine You will need tetanus booster by next year We will call you regarding lab work done today Continue yearly mammograms Consider repeat bone density scan by next year

## 2014-04-05 LAB — CA 125: CA 125: 8 U/mL (ref <35)

## 2014-04-07 ENCOUNTER — Ambulatory Visit: Payer: BC Managed Care – PPO | Admitting: Obstetrics & Gynecology

## 2014-04-08 ENCOUNTER — Emergency Department (HOSPITAL_COMMUNITY): Payer: Medicare Other

## 2014-04-08 ENCOUNTER — Inpatient Hospital Stay (HOSPITAL_COMMUNITY)
Admission: EM | Admit: 2014-04-08 | Discharge: 2014-04-13 | DRG: 871 | Disposition: A | Discharge status: Home / Self Care | Payer: Medicare Other | Attending: Internal Medicine | Admitting: Internal Medicine

## 2014-04-08 ENCOUNTER — Inpatient Hospital Stay (HOSPITAL_COMMUNITY): Payer: Medicare Other

## 2014-04-08 ENCOUNTER — Encounter (HOSPITAL_COMMUNITY): Payer: Self-pay

## 2014-04-08 DIAGNOSIS — R6521 Severe sepsis with septic shock: Secondary | ICD-10-CM

## 2014-04-08 DIAGNOSIS — Z90710 Acquired absence of both cervix and uterus: Secondary | ICD-10-CM

## 2014-04-08 DIAGNOSIS — Z452 Encounter for adjustment and management of vascular access device: Secondary | ICD-10-CM | POA: Diagnosis not present

## 2014-04-08 DIAGNOSIS — E669 Obesity, unspecified: Secondary | ICD-10-CM | POA: Diagnosis present

## 2014-04-08 DIAGNOSIS — E876 Hypokalemia: Secondary | ICD-10-CM | POA: Diagnosis present

## 2014-04-08 DIAGNOSIS — N12 Tubulo-interstitial nephritis, not specified as acute or chronic: Secondary | ICD-10-CM | POA: Diagnosis not present

## 2014-04-08 DIAGNOSIS — G4730 Sleep apnea, unspecified: Secondary | ICD-10-CM | POA: Diagnosis present

## 2014-04-08 DIAGNOSIS — W540XXA Bitten by dog, initial encounter: Secondary | ICD-10-CM | POA: Diagnosis present

## 2014-04-08 DIAGNOSIS — Z880 Allergy status to penicillin: Secondary | ICD-10-CM | POA: Diagnosis not present

## 2014-04-08 DIAGNOSIS — Z9884 Bariatric surgery status: Secondary | ICD-10-CM | POA: Diagnosis not present

## 2014-04-08 DIAGNOSIS — Z885 Allergy status to narcotic agent status: Secondary | ICD-10-CM

## 2014-04-08 DIAGNOSIS — R079 Chest pain, unspecified: Secondary | ICD-10-CM | POA: Diagnosis not present

## 2014-04-08 DIAGNOSIS — R000 Tachycardia, unspecified: Secondary | ICD-10-CM | POA: Diagnosis not present

## 2014-04-08 DIAGNOSIS — M8580 Other specified disorders of bone density and structure, unspecified site: Secondary | ICD-10-CM | POA: Diagnosis present

## 2014-04-08 DIAGNOSIS — A084 Viral intestinal infection, unspecified: Secondary | ICD-10-CM | POA: Diagnosis present

## 2014-04-08 DIAGNOSIS — R197 Diarrhea, unspecified: Secondary | ICD-10-CM

## 2014-04-08 DIAGNOSIS — N390 Urinary tract infection, site not specified: Secondary | ICD-10-CM | POA: Diagnosis not present

## 2014-04-08 DIAGNOSIS — I2510 Atherosclerotic heart disease of native coronary artery without angina pectoris: Secondary | ICD-10-CM | POA: Diagnosis present

## 2014-04-08 DIAGNOSIS — N200 Calculus of kidney: Secondary | ICD-10-CM | POA: Diagnosis present

## 2014-04-08 DIAGNOSIS — R748 Abnormal levels of other serum enzymes: Secondary | ICD-10-CM | POA: Diagnosis present

## 2014-04-08 DIAGNOSIS — B9689 Other specified bacterial agents as the cause of diseases classified elsewhere: Secondary | ICD-10-CM | POA: Diagnosis present

## 2014-04-08 DIAGNOSIS — F419 Anxiety disorder, unspecified: Secondary | ICD-10-CM | POA: Diagnosis present

## 2014-04-08 DIAGNOSIS — F5104 Psychophysiologic insomnia: Secondary | ICD-10-CM | POA: Diagnosis present

## 2014-04-08 DIAGNOSIS — M1990 Unspecified osteoarthritis, unspecified site: Secondary | ICD-10-CM | POA: Diagnosis present

## 2014-04-08 DIAGNOSIS — K2970 Gastritis, unspecified, without bleeding: Secondary | ICD-10-CM | POA: Diagnosis not present

## 2014-04-08 DIAGNOSIS — I959 Hypotension, unspecified: Secondary | ICD-10-CM

## 2014-04-08 DIAGNOSIS — E872 Acidosis, unspecified: Secondary | ICD-10-CM

## 2014-04-08 DIAGNOSIS — R112 Nausea with vomiting, unspecified: Secondary | ICD-10-CM | POA: Diagnosis not present

## 2014-04-08 DIAGNOSIS — R51 Headache: Secondary | ICD-10-CM

## 2014-04-08 DIAGNOSIS — A419 Sepsis, unspecified organism: Principal | ICD-10-CM | POA: Diagnosis present

## 2014-04-08 DIAGNOSIS — Z6834 Body mass index (BMI) 34.0-34.9, adult: Secondary | ICD-10-CM

## 2014-04-08 DIAGNOSIS — E860 Dehydration: Secondary | ICD-10-CM

## 2014-04-08 DIAGNOSIS — Z9109 Other allergy status, other than to drugs and biological substances: Secondary | ICD-10-CM

## 2014-04-08 DIAGNOSIS — B9620 Unspecified Escherichia coli [E. coli] as the cause of diseases classified elsewhere: Secondary | ICD-10-CM | POA: Diagnosis present

## 2014-04-08 DIAGNOSIS — I059 Rheumatic mitral valve disease, unspecified: Secondary | ICD-10-CM | POA: Diagnosis not present

## 2014-04-08 DIAGNOSIS — R519 Headache, unspecified: Secondary | ICD-10-CM | POA: Diagnosis present

## 2014-04-08 DIAGNOSIS — R0602 Shortness of breath: Secondary | ICD-10-CM | POA: Diagnosis not present

## 2014-04-08 DIAGNOSIS — N2889 Other specified disorders of kidney and ureter: Secondary | ICD-10-CM | POA: Diagnosis not present

## 2014-04-08 DIAGNOSIS — R6520 Severe sepsis without septic shock: Secondary | ICD-10-CM | POA: Diagnosis not present

## 2014-04-08 HISTORY — DX: Sepsis, unspecified organism: A41.9

## 2014-04-08 HISTORY — DX: Urinary tract infection, site not specified: N39.0

## 2014-04-08 HISTORY — DX: Acidosis: E87.2

## 2014-04-08 LAB — I-STAT CHEM 8, ED
BUN: 14 mg/dL (ref 6–23)
Calcium, Ion: 1.06 mmol/L — ABNORMAL LOW (ref 1.13–1.30)
Chloride: 105 mEq/L (ref 96–112)
Creatinine, Ser: 1.1 mg/dL (ref 0.50–1.10)
Glucose, Bld: 129 mg/dL — ABNORMAL HIGH (ref 70–99)
HCT: 47 % — ABNORMAL HIGH (ref 36.0–46.0)
Hemoglobin: 16 g/dL — ABNORMAL HIGH (ref 12.0–15.0)
Potassium: 3.3 mmol/L — ABNORMAL LOW (ref 3.5–5.1)
Sodium: 141 mmol/L (ref 135–145)
TCO2: 16 mmol/L (ref 0–100)

## 2014-04-08 LAB — URINALYSIS, ROUTINE W REFLEX MICROSCOPIC
Bilirubin Urine: NEGATIVE
Bilirubin Urine: NEGATIVE
Glucose, UA: NEGATIVE mg/dL
Glucose, UA: NEGATIVE mg/dL
Ketones, ur: 15 mg/dL — AB
Ketones, ur: NEGATIVE mg/dL
Leukocytes, UA: NEGATIVE
Leukocytes, UA: NEGATIVE
Nitrite: POSITIVE — AB
Nitrite: POSITIVE — AB
Protein, ur: 30 mg/dL — AB
Protein, ur: NEGATIVE mg/dL
Specific Gravity, Urine: 1.016 (ref 1.005–1.030)
Specific Gravity, Urine: 1.012 (ref 1.005–1.030)
Urobilinogen, UA: 0.2 mg/dL (ref 0.0–1.0)
Urobilinogen, UA: 0.2 mg/dL (ref 0.0–1.0)
pH: 5.5 (ref 5.0–8.0)
pH: 5.5 (ref 5.0–8.0)

## 2014-04-08 LAB — APTT: aPTT: 52 seconds — ABNORMAL HIGH (ref 24–37)

## 2014-04-08 LAB — PROTIME-INR
INR: 1.64 — ABNORMAL HIGH (ref 0.00–1.49)
Prothrombin Time: 19.5 seconds — ABNORMAL HIGH (ref 11.6–15.2)

## 2014-04-08 LAB — CBC WITH DIFFERENTIAL/PLATELET
Basophils Absolute: 0 10*3/uL (ref 0.0–0.1)
Basophils Relative: 0 % (ref 0–1)
Eosinophils Absolute: 0 10*3/uL (ref 0.0–0.7)
Eosinophils Relative: 0 % (ref 0–5)
HCT: 42.7 % (ref 36.0–46.0)
Hemoglobin: 13.9 g/dL (ref 12.0–15.0)
Lymphocytes Relative: 2 % — ABNORMAL LOW (ref 12–46)
Lymphs Abs: 0.3 10*3/uL — ABNORMAL LOW (ref 0.7–4.0)
MCH: 31.4 pg (ref 26.0–34.0)
MCHC: 32.6 g/dL (ref 30.0–36.0)
MCV: 96.6 fL (ref 78.0–100.0)
Monocytes Absolute: 0.2 10*3/uL (ref 0.1–1.0)
Monocytes Relative: 1 % — ABNORMAL LOW (ref 3–12)
Neutro Abs: 16.5 10*3/uL — ABNORMAL HIGH (ref 1.7–7.7)
Neutrophils Relative %: 97 % — ABNORMAL HIGH (ref 43–77)
Platelets: 196 10*3/uL (ref 150–400)
RBC: 4.42 MIL/uL (ref 3.87–5.11)
RDW: 14.3 % (ref 11.5–15.5)
WBC: 17 10*3/uL — ABNORMAL HIGH (ref 4.0–10.5)

## 2014-04-08 LAB — POCT I-STAT 3, ART BLOOD GAS (G3+)
Acid-base deficit: 13 mmol/L — ABNORMAL HIGH (ref 0.0–2.0)
Bicarbonate: 11.2 mEq/L — ABNORMAL LOW (ref 20.0–24.0)
O2 Saturation: 97 %
Patient temperature: 99.4
TCO2: 12 mmol/L (ref 0–100)
pCO2 arterial: 21.1 mmHg — ABNORMAL LOW (ref 35.0–45.0)
pH, Arterial: 7.334 — ABNORMAL LOW (ref 7.350–7.450)
pO2, Arterial: 99 mmHg (ref 80.0–100.0)

## 2014-04-08 LAB — URINE MICROSCOPIC-ADD ON

## 2014-04-08 LAB — I-STAT TROPONIN, ED: Troponin i, poc: 0 ng/mL (ref 0.00–0.08)

## 2014-04-08 LAB — HEPATIC FUNCTION PANEL
ALT: 19 U/L (ref 0–35)
AST: 41 U/L — ABNORMAL HIGH (ref 0–37)
Albumin: 3.5 g/dL (ref 3.5–5.2)
Alkaline Phosphatase: 151 U/L — ABNORMAL HIGH (ref 39–117)
Bilirubin, Direct: 0.3 mg/dL (ref 0.0–0.3)
Indirect Bilirubin: 0.8 mg/dL (ref 0.3–0.9)
Total Bilirubin: 1.1 mg/dL (ref 0.3–1.2)
Total Protein: 6.8 g/dL (ref 6.0–8.3)

## 2014-04-08 LAB — TROPONIN I: Troponin I: 0.04 ng/mL — ABNORMAL HIGH (ref <0.031)

## 2014-04-08 LAB — MRSA PCR SCREENING: MRSA by PCR: NEGATIVE

## 2014-04-08 LAB — I-STAT CG4 LACTIC ACID, ED: Lactic Acid, Venous: 5.33 mmol/L — ABNORMAL HIGH (ref 0.5–2.2)

## 2014-04-08 LAB — LIPASE, BLOOD: Lipase: 26 U/L (ref 11–59)

## 2014-04-08 LAB — CORTISOL: Cortisol, Plasma: 39.3 ug/dL

## 2014-04-08 LAB — LACTIC ACID, PLASMA: Lactic Acid, Venous: 3.9 mmol/L — ABNORMAL HIGH (ref 0.5–2.2)

## 2014-04-08 LAB — PROCALCITONIN: Procalcitonin: 12.5 ng/mL

## 2014-04-08 MED ORDER — WHITE PETROLATUM GEL
Status: AC
  Administered 2014-04-08: 0.2
  Filled 2014-04-08: qty 1

## 2014-04-08 MED ORDER — SODIUM CHLORIDE 0.9 % IV SOLN
INTRAVENOUS | Status: DC
  Administered 2014-04-08 – 2014-04-09 (×4): via INTRAVENOUS

## 2014-04-08 MED ORDER — ACETAMINOPHEN 650 MG RE SUPP
650.0000 mg | Freq: Once | RECTAL | Status: AC
  Administered 2014-04-08: 650 mg via RECTAL
  Filled 2014-04-08: qty 1

## 2014-04-08 MED ORDER — KETOROLAC TROMETHAMINE 30 MG/ML IJ SOLN
30.0000 mg | Freq: Once | INTRAMUSCULAR | Status: AC
  Administered 2014-04-08: 30 mg via INTRAVENOUS
  Filled 2014-04-08: qty 1

## 2014-04-08 MED ORDER — SODIUM CHLORIDE 0.9 % IV BOLUS (SEPSIS)
1000.0000 mL | Freq: Once | INTRAVENOUS | Status: AC
  Administered 2014-04-08: 1000 mL via INTRAVENOUS

## 2014-04-08 MED ORDER — SODIUM CHLORIDE 0.9 % IV BOLUS (SEPSIS): 1000.0000 mL | Freq: Once | INTRAVENOUS | Status: DC

## 2014-04-08 MED ORDER — SODIUM CHLORIDE 0.9 % IV SOLN: 250.0000 mL | INTRAVENOUS | Status: DC | PRN

## 2014-04-08 MED ORDER — ENOXAPARIN SODIUM 40 MG/0.4ML ~~LOC~~ SOLN
40.0000 mg | SUBCUTANEOUS | Status: DC
Start: 2014-04-08 — End: 2014-04-08

## 2014-04-08 MED ORDER — HYDROCORTISONE NA SUCCINATE PF 100 MG IJ SOLR
50.0000 mg | Freq: Four times a day (QID) | INTRAMUSCULAR | Status: DC
  Filled 2014-04-08: qty 2

## 2014-04-08 MED ORDER — VANCOMYCIN HCL IN DEXTROSE 750-5 MG/150ML-% IV SOLN
750.0000 mg | Freq: Two times a day (BID) | INTRAVENOUS | Status: DC
  Administered 2014-04-08 – 2014-04-09 (×2): 750 mg via INTRAVENOUS
  Filled 2014-04-08 (×2): qty 150

## 2014-04-08 MED ORDER — NOREPINEPHRINE BITARTRATE 1 MG/ML IV SOLN
2.0000 ug/min | INTRAVENOUS | Status: DC
  Administered 2014-04-08: 10 ug/min via INTRAVENOUS
  Filled 2014-04-08 (×3): qty 4

## 2014-04-08 MED ORDER — DEXTROSE 5 % IV SOLN
20.0000 ug/min | INTRAVENOUS | Status: DC
  Administered 2014-04-08: 20 ug/min via INTRAVENOUS
  Administered 2014-04-08: 140 ug/min via INTRAVENOUS
  Administered 2014-04-08: 160 ug/min via INTRAVENOUS
  Filled 2014-04-08 (×2): qty 1

## 2014-04-08 MED ORDER — VANCOMYCIN HCL IN DEXTROSE 1-5 GM/200ML-% IV SOLN
1000.0000 mg | Freq: Once | INTRAVENOUS | Status: AC
  Administered 2014-04-08: 1000 mg via INTRAVENOUS
  Filled 2014-04-08: qty 200

## 2014-04-08 MED ORDER — PROMETHAZINE HCL 25 MG/ML IJ SOLN
12.5000 mg | Freq: Four times a day (QID) | INTRAMUSCULAR | Status: DC | PRN
  Administered 2014-04-08: 12.5 mg via INTRAVENOUS
  Filled 2014-04-08: qty 1

## 2014-04-08 MED ORDER — SODIUM CHLORIDE 0.9 % IV BOLUS (SEPSIS)
2000.0000 mL | Freq: Once | INTRAVENOUS | Status: AC
  Administered 2014-04-08: 2000 mL via INTRAVENOUS

## 2014-04-08 MED ORDER — SODIUM CHLORIDE 0.9 % IV BOLUS (SEPSIS)
500.0000 mL | Freq: Once | INTRAVENOUS | Status: AC
  Administered 2014-04-08: 500 mL via INTRAVENOUS

## 2014-04-08 MED ORDER — LORAZEPAM 2 MG/ML IJ SOLN
0.5000 mg | Freq: Four times a day (QID) | INTRAMUSCULAR | Status: DC | PRN
  Administered 2014-04-08: 1 mg via INTRAVENOUS
  Administered 2014-04-09 (×2): 0.5 mg via INTRAVENOUS
  Filled 2014-04-08 (×2): qty 1

## 2014-04-08 MED ORDER — LORAZEPAM 2 MG/ML IJ SOLN
0.5000 mg | Freq: Three times a day (TID) | INTRAMUSCULAR | Status: DC | PRN
  Administered 2014-04-08: 1 mg via INTRAVENOUS
  Filled 2014-04-08 (×2): qty 1

## 2014-04-08 MED ORDER — PHENYLEPHRINE HCL 10 MG/ML IJ SOLN
0.0000 ug/min | INTRAVENOUS | Status: DC
  Filled 2014-04-08: qty 1

## 2014-04-08 MED ORDER — ENOXAPARIN SODIUM 40 MG/0.4ML ~~LOC~~ SOLN
40.0000 mg | SUBCUTANEOUS | Status: DC
  Administered 2014-04-08 – 2014-04-12 (×5): 40 mg via SUBCUTANEOUS
  Filled 2014-04-08 (×6): qty 0.4

## 2014-04-08 MED ORDER — LEVOFLOXACIN IN D5W 750 MG/150ML IV SOLN
750.0000 mg | INTRAVENOUS | Status: DC
  Administered 2014-04-09: 750 mg via INTRAVENOUS
  Filled 2014-04-08 (×2): qty 150

## 2014-04-08 MED ORDER — FENTANYL CITRATE 0.05 MG/ML IJ SOLN
INTRAMUSCULAR | Status: AC
  Administered 2014-04-08: 25 ug
  Filled 2014-04-08: qty 2

## 2014-04-08 MED ORDER — LEVOFLOXACIN IN D5W 750 MG/150ML IV SOLN
750.0000 mg | Freq: Once | INTRAVENOUS | Status: AC
  Administered 2014-04-08: 750 mg via INTRAVENOUS
  Filled 2014-04-08: qty 150

## 2014-04-08 MED ORDER — HYDROCORTISONE NA SUCCINATE PF 100 MG IJ SOLR
100.0000 mg | Freq: Once | INTRAMUSCULAR | Status: AC
  Administered 2014-04-08: 100 mg via INTRAVENOUS

## 2014-04-08 MED ORDER — ONDANSETRON HCL 4 MG/2ML IJ SOLN
4.0000 mg | Freq: Four times a day (QID) | INTRAMUSCULAR | Status: DC | PRN
  Administered 2014-04-08 (×2): 4 mg via INTRAVENOUS
  Filled 2014-04-08 (×2): qty 2

## 2014-04-08 NOTE — Progress Notes (Signed)
Awaiting stool sample from patient.

## 2014-04-08 NOTE — ED Notes (Signed)
PER EMS: pt called out for N/V/D that started yesterday and back pain that started 2 days ago. Upon arrival EMS states her HR 160s (possible A-flutter per EMS) and initial BP was 123/68. EMS adm 282ml of NaCl, 50 mcg of fentanyl and 4mg  zofran and BP decreased to 80/39. HR currently 166, pt A&OX4, denies chest pain.

## 2014-04-08 NOTE — Progress Notes (Signed)
Patient placed on Cdiff precautions on arrival to [facility]. Patient states she has had nausea, vomiting, diarrhea x2 days. >3 loose stools with some abdominal pain and low grade temp. Patient was placed on protocol and PPE hung outside room with sign. Family educated on PPE use and hand washing policy.

## 2014-04-08 NOTE — ED Notes (Signed)
Pharmacy notified to prepare neo-synephrine

## 2014-04-08 NOTE — ED Provider Notes (Signed)
CSN: [id number]     Arrival date & time 04/08/14  0400 History   First MD Initiated Contact with Patient 04/08/14 0407     Chief Complaint  Patient presents with  . Tachycardia     (Consider location/radiation/quality/duration/timing/severity/associated sxs/prior Treatment) Patient is a 69 y.o. female presenting with diarrhea. The history is provided by the patient.  Diarrhea Quality:  Watery Severity:  Severe Onset quality:  Sudden Duration:  10 hours Timing:  Intermittent (every 45 minutes per patient report) Progression:  Unchanged Relieved by:  Nothing Ineffective treatments:  None tried Associated symptoms: fever and vomiting   Associated symptoms comment:  Low back pain B equal x 48 hours Risk factors: no travel to endemic areas   Patient with a h/o of ovarian CA most recently seen by her PMD on 04/04/14 for her annual exam and had normal labs at that time in [facility] who reports 2 days of B low back pain.  Thought it might be a stone and started taking Flomax and then this evening had n/v/d.  Copious Q 45 minutes per report.    Past Medical History  Diagnosis Date  . VIRAL URI 03/06/2009  . Cervicalgia 05/19/2009  . WEIGHT GAIN 06/30/2008  . Headache(784.0) 05/19/2009  . SPRAIN AND STRAIN OF UNSPECIFIED SITE OF HAND 05/19/2009  . NEOPLASM, MALIGNANT, OVARY, HX OF 06/30/2008   Past Surgical History  Procedure Laterality Date  . Appendectomy    . Abdominal hysterectomy    . Ovarian cancer  2007  . Cesarean section    . Splenectomy  2007  . Gastric bypass  1979  . Transurethral resection of bladder  2007    LESION  . Renal calculi    . Cholecystectomy  2011   Family History  Problem Relation Age of Onset  . Arthritis Other   . Arthritis Mother     ?osteoarthritis  . Cancer Father 67    lymphoma  . Cancer Sister     ovaian cancer  . Diabetes Sister     type 2    History  Substance Use Topics  . Smoking status: Never Smoker   . Smokeless tobacco: Not on file  .  Alcohol Use: No   OB History    Gravida Para Term Preterm AB TAB SAB Ectopic Multiple Living   3 2 2  1  1   2      Review of Systems  Constitutional: Positive for fever.  Respiratory: Negative for cough.   Gastrointestinal: Positive for nausea, vomiting and diarrhea.  Genitourinary: Negative for flank pain.  All other systems reviewed and are negative.     Allergies  Morphine and related and Penicillins  Home Medications   Prior to Admission medications   Medication Sig Start Date End Date Taking? Authorizing Provider  calcium carbonate (OS-CAL - DOSED IN MG OF ELEMENTAL CALCIUM) 1250 MG tablet Take 2 tablets by mouth daily.     Historical Provider, MD  ibuprofen (ADVIL,MOTRIN) 200 MG tablet Take 400 mg by mouth every 6 (six) hours as needed for pain.    Historical Provider, MD  Multiple Vitamin (MULTIVITAMIN) tablet Take 1 tablet by mouth daily.     Historical Provider, MD  zolpidem (AMBIEN) 10 MG tablet TAKE 1 TABLET BY MOUTH AT BEDTIME **15 PER 30 INSURANCE 03/27/14   [name], MD   BP 112/91 mmHg  Pulse 158  Temp(Src) 103.4 F (39.7 C) (Rectal)  Resp 27  SpO2 96% Physical Exam  Constitutional: She  is oriented to person, place, and time. She appears well-developed and well-nourished.  Ill appearing  HENT:  Head: Normocephalic and atraumatic.  Mouth/Throat: No oropharyngeal exudate.  Tacky mucus membranes  Eyes: Conjunctivae and EOM are normal. Pupils are equal, round, and reactive to light.  Neck: Normal range of motion. Neck supple. No tracheal deviation present.  Cardiovascular: Regular rhythm.  Tachycardia present.   Pulmonary/Chest: She has rales.  Abdominal: Soft. Bowel sounds are normal. There is no tenderness. There is no rebound and no guarding.  Musculoskeletal: Normal range of motion. She exhibits no edema.  Neurological: She is alert and oriented to person, place, and time.  Skin: Skin is warm and dry. She is not diaphoretic.  Psychiatric: She  has a normal mood and affect.    ED Course  Procedures (including critical care time) Labs Review Labs Reviewed  CBC WITH DIFFERENTIAL - Abnormal; Notable for the following:    WBC 17.0 (*)    Neutrophils Relative % 97 (*)    Neutro Abs 16.5 (*)    Lymphocytes Relative 2 (*)    Lymphs Abs 0.3 (*)    Monocytes Relative 1 (*)    All other components within normal limits  URINALYSIS, ROUTINE W REFLEX MICROSCOPIC - Abnormal; Notable for the following:    Hgb urine dipstick TRACE (*)    Ketones, ur 15 (*)    Protein, ur 30 (*)    Nitrite POSITIVE (*)    All other components within normal limits  HEPATIC FUNCTION PANEL - Abnormal; Notable for the following:    AST 41 (*)    Alkaline Phosphatase 151 (*)    All other components within normal limits  URINE MICROSCOPIC-ADD ON - Abnormal; Notable for the following:    Bacteria, UA MANY (*)    All other components within normal limits  I-STAT CHEM 8, ED - Abnormal; Notable for the following:    Potassium 3.3 (*)    Glucose, Bld 129 (*)    Calcium, Ion 1.06 (*)    Hemoglobin 16.0 (*)    HCT 47.0 (*)    All other components within normal limits  I-STAT CG4 LACTIC ACID, ED - Abnormal; Notable for the following:    Lactic Acid, Venous 5.33 (*)    All other components within normal limits  CULTURE, BLOOD (ROUTINE X 2)  CULTURE, BLOOD (ROUTINE X 2)  URINE CULTURE  LIPASE, BLOOD  I-STAT TROPOININ, ED    Imaging Review Dg Chest Portable 1 View  04/08/2014   CLINICAL DATA:  Acute onset of generalized chest pain and shortness of breath. Initial encounter.  EXAM: PORTABLE CHEST - 1 VIEW  COMPARISON:  Chest radiograph performed 01/14/2014  FINDINGS: The lungs are well-aerated and clear. There is no evidence of focal opacification, pleural effusion or pneumothorax.  The cardiomediastinal silhouette is within normal limits. No acute osseous abnormalities are seen.  IMPRESSION: No acute cardiopulmonary process seen.   Electronically Signed   By:  Garald Balding M.D.   On: 04/08/2014 04:25     EKG Interpretation   Date/Time:  Tuesday April 08 2014 04:08:33 EST Ventricular Rate:  163 PR Interval:  86 QRS Duration: 84 QT Interval:  299 QTC Calculation: 492 R Axis:   117 Text Interpretation:  Sinus tachycardia Repolarization abnormality, prob  rate related Confirmed by [facility]  MD, Emmaline Kluver ([id number]) on 04/08/2014  4:16:33 AM      MDM   Final diagnoses:  Tachycardia    MDM Reviewed: previous chart, nursing  note and vitals Reviewed previous: labs Interpretation: labs, ECG and x-ray (cephalization on XRay) Total time providing critical care: 75-105 minutes. This excludes time spent performing separately reportable procedures and services. Consults: pulmonary    Medications  sodium chloride 0.9 % bolus 1,000 mL (1,000 mLs Intravenous New Bag/Given 04/08/14 0645)  0.9 %  sodium chloride infusion (not administered)  phenylephrine (NEO-SYNEPHRINE) 10 mg in dextrose 5 % 250 mL (0.04 mg/mL) infusion (not administered)  sodium chloride 0.9 % bolus 1,000 mL (not administered)  hydrocortisone sodium succinate (SOLU-CORTEF) 100 MG injection 50 mg (not administered)  sodium chloride 0.9 % bolus 500 mL (0 mLs Intravenous Stopped 04/08/14 0527)  ketorolac (TORADOL) 30 MG/ML injection 30 mg (30 mg Intravenous Given 04/08/14 0440)  sodium chloride 0.9 % bolus 1,000 mL (0 mLs Intravenous Stopped 04/08/14 0527)  acetaminophen (TYLENOL) suppository 650 mg (650 mg Rectal Given 04/08/14 0454)  levofloxacin (LEVAQUIN) IVPB 750 mg (750 mg Intravenous New Bag/Given 04/08/14 0527)  vancomycin (VANCOCIN) IVPB 1000 mg/200 mL premix (0 mg Intravenous Stopped 04/08/14 0701)  sodium chloride 0.9 % bolus 1,000 mL (0 mLs Intravenous Stopped 04/08/14 0643)  sodium chloride 0.9 % bolus 1,000 mL (0 mLs Intravenous Stopped 04/08/14 0643)  sodium chloride 0.9 % bolus 2,000 mL (2,000 mLs Intravenous New Bag/Given 04/08/14 0650)    DDx:   1. Dehydration  secondary to vomiting and diarrhea 2. PNA given rales on Exam, less likely with negative CXR and no complaints of cough 3. Pyelonephritis/ UTI with h/o of LBP and fever with vomiting 4. Sepsis   CRITICAL CARE Performed by: Carlisle Beers Total critical care time: 90 minutes Critical care time was exclusive of separately billable procedures and treating other patients. Critical care was necessary to treat or prevent imminent or life-threatening deterioration. Critical care was time spent personally by me on the following activities: development of treatment plan with patient and/or surrogate as well as nursing, discussions with consultants, evaluation of patient's response to treatment, examination of patient, obtaining history from patient or surrogate, ordering and performing treatments and interventions, ordering and review of laboratory studies, ordering and review of radiographic studies, pulse oximetry and re-evaluation of patient's condition.   Carlisle Beers, MD 04/08/14 [phone]

## 2014-04-08 NOTE — ED Notes (Signed)
Dr. Palumbo at bedside. 

## 2014-04-08 NOTE — Progress Notes (Signed)
ANTIBIOTIC CONSULT NOTE - INITIAL  Pharmacy Consult for Vancomycin/Levaquin  Indication: rule out sepsis, ?UTI  Allergies  Allergen Reactions  . Morphine And Related Nausea And Vomiting  . Penicillins Hives    Patient Measurements: 80.3 kg  Vital Signs: Temp: 100.1 F (37.8 C) (01/12 0613) Temp Source: Oral (01/12 0613) BP: 75/43 mmHg (01/12 0730) Pulse Rate: 105 (01/12 0730)  Labs:  Recent Labs  04/08/14 0424 04/08/14 0434  WBC 17.0*  --   HGB 13.9 16.0*  PLT 196  --   CREATININE  --  1.10   Estimated Creatinine Clearance: 48.8 mL/min (by C-G formula based on Cr of 1.1).  Medical History: Past Medical History  Diagnosis Date  . VIRAL URI 03/06/2009  . Cervicalgia 05/19/2009  . WEIGHT GAIN 06/30/2008  . Headache(784.0) 05/19/2009  . SPRAIN AND STRAIN OF UNSPECIFIED SITE OF HAND 05/19/2009  . NEOPLASM, MALIGNANT, OVARY, HX OF 06/30/2008    Assessment: 68 y/o F here with back pain/fever/chills x 2 days. WBC elevated, lactic acid 5.33, renal function is age appropriate, abnormal U/A, hypotensive, Tmax 103.4, other labs as above.   Goal of Therapy:  Vancomycin trough level 15-20 mcg/ml  Plan:  -Vancomycin 750 mg IV q12h -Levaquin 750 mg IV q24h, decrease dose to q48h with any decline in renal function  -Trend WBC, temp, renal function   Narda Bonds 04/08/2014,7:36 AM

## 2014-04-08 NOTE — Procedures (Signed)
Central Venous Catheter Insertion Procedure Note [name] [id number] [birth year]  Procedure: Insertion of Central Venous Catheter Indications: Drug and/or fluid administration  Procedure Details Consent: Risks of procedure as well as the alternatives and risks of each were explained to the (patient/caregiver).  Consent for procedure obtained. Time Out: Verified patient identification, verified procedure, site/side was marked, verified correct patient position, special equipment/implants available, medications/allergies/relevent history reviewed, required imaging and test results available.  Performed  Maximum sterile technique was used including antiseptics, cap, gloves, gown, hand hygiene, mask and sheet. Skin prep: Chlorhexidine; local anesthetic administered A antimicrobial bonded/coated triple lumen catheter was placed in the left internal jugular vein using the Seldinger technique.  Evaluation Blood flow good Complications: No apparent complications Patient did tolerate procedure well. Chest X-ray ordered to verify placement.  CXR: pending.  Procedure performed under direct ultrasound guidance for real time vessel cannulation.     Dellia Nims, M.D PGY-1 Internal Medicine Pager [phone]  I was present for and supervised the entire procedure  Merton Border, MD ; [facility] service [place] [phone].  After 5:30 PM or weekends, call [phone]

## 2014-04-08 NOTE — Progress Notes (Signed)
>  12hrs since admitted with no stool. Still awaiting PCR. Cdiff protocol entered in epic. Strict contact maintained since [facility] to [facility].

## 2014-04-08 NOTE — H&P (Signed)
PULMONARY  / CRITICAL CARE MEDICINE  Name: [name] MRN: [id number] DOB: 1946-01-31    ADMISSION DATE:  04/08/2014 CONSULTATION DATE:  04/08/2014   REFERRING MD :  EDP PRIMARY SERVICE: PCCM  CHIEF COMPLAINT:  Back pain with fevers and chills for 2 days. Nausea, vomiting and diarrhea for 1 day.  BRIEF PATIENT DESCRIPTION:  69 year old woman with past medical history of obesity, osteopenia, history of recurrent kidney stones, chronic insomnia, osteoarthritis and history of ovarian cancer in remission for several years. She presents with nausea, vomiting and back pain. She is suspected to have pyelonephritis with septic shock and gastroenteritis.  SIGNIFICANT EVENTS / STUDIES:  none  LINES / TUBES: none  CULTURES: Blood culture 1/12 >> Urine culture 1/12 >>   ANTIBIOTICS: Levaquin 1/12 >> Vanc 1/12 >>  HISTORY OF PRESENT ILLNESS: 69 year old woman with past medical history of obesity, osteopenia, history of recurrent kidney stones, chronic insomnia, osteoarthritis and history of ovarian cancer in remission for several years. She states that 2 days ago, she experienced severe back pain which she associated to a kidney stone. She believes that she passed the kidney stone, but her back pain did not get better. This was followed by nausea and vomiting associated with chills and fevers, but without abdominal pain. Her vomitus is nonbilious, nonbloody multiple times since onset. Her diarrhea too his nonbloody watery stools with mucus, more than 30 times since onset. She called EMS this morning when her condition got worse. During my interview, patient was alert and was able provide her clinical history. EMS states her HR 160s (possible A-flutter per EMS) and initial BP was 123/68. She received 238ml of NaCl, 50 mcg of fentanyl and 4mg  zofran and BP decreased to 80/39 and HR 166. Temp is 103.4.   PAST MEDICAL HISTORY :  Past Medical History  Diagnosis Date  . VIRAL URI 03/06/2009  .  Cervicalgia 05/19/2009  . WEIGHT GAIN 06/30/2008  . Headache(784.0) 05/19/2009  . SPRAIN AND STRAIN OF UNSPECIFIED SITE OF HAND 05/19/2009  . NEOPLASM, MALIGNANT, OVARY, HX OF 06/30/2008   Past Surgical History  Procedure Laterality Date  . Appendectomy    . Abdominal hysterectomy    . Ovarian cancer  2007  . Cesarean section    . Splenectomy  2007  . Gastric bypass  1979  . Transurethral resection of bladder  2007    LESION  . Renal calculi    . Cholecystectomy  2011   Prior to Admission medications   Medication Sig Start Date End Date Taking? Authorizing Provider  calcium carbonate (OS-CAL - DOSED IN MG OF ELEMENTAL CALCIUM) 1250 MG tablet Take 2 tablets by mouth daily.    Yes Historical Provider, MD  ibuprofen (ADVIL,MOTRIN) 200 MG tablet Take 400 mg by mouth every 6 (six) hours as needed for pain.   Yes Historical Provider, MD  Multiple Vitamin (MULTIVITAMIN) tablet Take 1 tablet by mouth daily.    Yes Historical Provider, MD  promethazine (PHENERGAN) 25 MG tablet Take 25 mg by mouth every 6 (six) hours as needed for nausea or vomiting.   Yes Historical Provider, MD  zolpidem (AMBIEN) 10 MG tablet TAKE 1 TABLET BY MOUTH AT BEDTIME **15 PER 30 INSURANCE Patient taking differently: Take 7.5 mg by mouth at bedtime. TAKE 1 TABLET BY MOUTH AT BEDTIME **15 PER 30 INSURANCE 03/27/14  Yes [name], MD   Allergies  Allergen Reactions  . Morphine And Related Nausea And Vomiting  .  Penicillins Hives    FAMILY HISTORY:  Family History  Problem Relation Age of Onset  . Arthritis Other   . Arthritis Mother     ?osteoarthritis  . Cancer Father 51    lymphoma  . Cancer Sister     ovaian cancer  . Diabetes Sister     type 2    SOCIAL HISTORY:  reports that she has never smoked. She does not have any smokeless tobacco history on file. She reports that she does not drink alcohol or use illicit drugs.  REVIEW OF SYSTEMS:   Cardiovascular: No chest pain, palpitations. She reports  dizziness  Respiratory: No shortness of breath, cough Neurologic: Reports confusion that without seizures, neck pain, photophobia. She also reports mild headache Skin: Denies bruises, bleeding or wounds VITAL SIGNS: Temp:  [100.2 F (37.9 C)-103.4 F (39.7 C)] 103.4 F (39.7 C) (01/12 0438) Pulse Rate:  [143-166] 143 (01/12 0545) Resp:  [18-27] 25 (01/12 0545) BP: (71-112)/(39-91) 94/39 mmHg (01/12 0545) SpO2:  [96 %-97 %] 97 % (01/12 0545) HEMODYNAMICS:   VENTILATOR SETTINGS:   INTAKE / OUTPUT: Intake/Output      01/11 0701 - 01/12 0700   IV Piggyback 1500   Total Intake 1500   Net +1500         PHYSICAL EXAMINATION: General:  Ill-appearing patient but  Alert and oriented 4. Neuro: Fully alert oriented 4. No focal neurological deficits. Pupils are equal and reactive bilaterally. HEENT: Dry mucous membranes. Neck is supple. Cardiovascular: Tachycardia, normal rhythm. No murmurs or added sounds Lungs:  Clear bilaterally on auscultation. Abdomen:  Nondistended, nontender, bowel sounds present. No palpable masses. Musculoskeletal:  Moves all extremities. Good muscle strength in both upper and lower extremities. Skin: No bruises. No wounds.  LABS:  Recent Labs Lab 04/04/14 0956 04/08/14 0424 04/08/14 0434  HGB 12.9 13.9 16.0*  WBC 8.2 17.0*  --   PLT 351.0 196  --   NA 143  --  141  K 4.2  --  3.3*  CL 110  --  105  CO2 26  --   --   GLUCOSE 86  --  129*  BUN 11  --  14  CREATININE 1.0  --  1.10  CALCIUM 9.2  --   --   AST 26 41*  --   ALT 20 19  --   ALKPHOS 100 151*  --   BILITOT 0.9 1.1  --   PROT 7.3 6.8  --   ALBUMIN 4.0 3.5  --   LATICACIDVEN  --   --  5.33*   No results for input(s): GLUCAP in the last 168 hours.  CXR: 1/12 > no active pulmonary process.  ASSESSMENT / PLAN:  PULMONARY A: No active issues. Chest x-ray without active pulmonary process. P:   No active interventions.  CARDIOVASCULAR A: Tachycardia. CAD history. Hypotension  due to septic shock. Elevated lactic acid 5.33 P:  Has received IVF 3.5L by 6:30AM. MAP 40-50.  Aggressive IVF per sepsis protocol - phase 2 order set  MAP goal 65 Start neo infusion via a PIV line Insert a third peripheral IV line Check cortisol level. Stat cortisol 50 mg every 6 after cortisol level checked and one hour after initiation of neo Monitor urine output  Check lactic acid level after 4 hours   RENAL A:  History of recurrent renal stones. ? Recent passage of kidney stone per patient P:   Renal ultrasound scan  Monitor renal function  GASTROINTESTINAL A:  Nonbloody gastroenteritis Rule out C. Difficile or viral GE P:   GI panel. Aggressive IV rehydration. IV Zofran prn  HEMATOLOGIC A:  Leukocytosis of 17k History of ovarian cancer in remission. P:  Lovenox. SCDs  INFECTIOUS A:  Septic shock due to? Pyelonephritis Urinalysis with nitrites positive and many bacteria Viral Gastroenteritis. Rule out C. Difficile Allergic to penicillins with hives P:   Blood and urine cultures sent Vancomycin 1/12 >> Levaquin 1/12 >> GI panel sent   ENDOCRINE A: no active issues P:   Start IV Hydrocortisone 50 mg q6 for 5 days or d/c random cortisol is <25 Monitor CBG due to steroids  NEUROLOGIC A: h/o of insomnia. Exam non-focal exam  C/o Headaches  P:   Monitor closely. No active interventions at this time.  TODAY'S SUMMARY: 69 yo otherwise health woman with septic shock, possible source is likely urinary tract. She also has GI symptoms with nausea, vomiting and diarrhea.? C. difficile. Started on aggressive IV fluid resuscitation, antibiotics and phenylephrine infusion initiated. She has no central IV access currently. Discussed plan of care with patient. She is full code. She will be admitted to ICU. Discussed case with Dr Chase Caller.  Jessee Avers, MD  PGY3 Internal Medicine  Pager: [phone] 04/08/2014, 5:59 AM or

## 2014-04-09 DIAGNOSIS — N12 Tubulo-interstitial nephritis, not specified as acute or chronic: Secondary | ICD-10-CM

## 2014-04-09 LAB — BASIC METABOLIC PANEL
Anion gap: 6 (ref 5–15)
BUN: 12 mg/dL (ref 6–23)
CO2: 19 mmol/L (ref 19–32)
Calcium: 6.5 mg/dL — ABNORMAL LOW (ref 8.4–10.5)
Chloride: 116 mEq/L — ABNORMAL HIGH (ref 96–112)
Creatinine, Ser: 1.11 mg/dL — ABNORMAL HIGH (ref 0.50–1.10)
GFR calc Af Amer: 59 mL/min — ABNORMAL LOW (ref >90)
GFR calc non Af Amer: 51 mL/min — ABNORMAL LOW (ref >90)
Glucose, Bld: 87 mg/dL (ref 70–99)
Potassium: 3 mmol/L — ABNORMAL LOW (ref 3.5–5.1)
Sodium: 141 mmol/L (ref 135–145)

## 2014-04-09 LAB — CBC
HCT: 30.3 % — ABNORMAL LOW (ref 36.0–46.0)
Hemoglobin: 10.1 g/dL — ABNORMAL LOW (ref 12.0–15.0)
MCH: 32.4 pg (ref 26.0–34.0)
MCHC: 33.3 g/dL (ref 30.0–36.0)
MCV: 97.1 fL (ref 78.0–100.0)
Platelets: 133 10*3/uL — ABNORMAL LOW (ref 150–400)
RBC: 3.12 MIL/uL — ABNORMAL LOW (ref 3.87–5.11)
RDW: 15.2 % (ref 11.5–15.5)
WBC: 35.5 10*3/uL — ABNORMAL HIGH (ref 4.0–10.5)

## 2014-04-09 LAB — PROCALCITONIN: Procalcitonin: 11.06 ng/mL

## 2014-04-09 LAB — CLOSTRIDIUM DIFFICILE BY PCR: Toxigenic C. Difficile by PCR: NEGATIVE

## 2014-04-09 MED ORDER — ZOLPIDEM TARTRATE 5 MG PO TABS
5.0000 mg | ORAL_TABLET | Freq: Every evening | ORAL | Status: DC | PRN
  Administered 2014-04-09 – 2014-04-12 (×4): 5 mg via ORAL
  Filled 2014-04-09 (×4): qty 1

## 2014-04-09 MED ORDER — DEXTROSE-NACL 5-0.45 % IV SOLN
INTRAVENOUS | Status: DC
  Administered 2014-04-09: 08:00:00 via INTRAVENOUS
  Filled 2014-04-09: qty 1000

## 2014-04-09 MED ORDER — LOPERAMIDE HCL 2 MG PO CAPS
4.0000 mg | ORAL_CAPSULE | Freq: Once | ORAL | Status: AC
  Administered 2014-04-09: 4 mg via ORAL
  Filled 2014-04-09: qty 2

## 2014-04-09 MED ORDER — LEVOFLOXACIN 750 MG PO TABS
750.0000 mg | ORAL_TABLET | Freq: Every day | ORAL | Status: DC
  Filled 2014-04-09 (×2): qty 1

## 2014-04-09 MED ORDER — ACETAMINOPHEN 325 MG PO TABS
650.0000 mg | ORAL_TABLET | ORAL | Status: DC | PRN
  Administered 2014-04-09 – 2014-04-12 (×4): 650 mg via ORAL
  Filled 2014-04-09 (×3): qty 2

## 2014-04-09 MED ORDER — ACETAMINOPHEN 325 MG PO TABS
ORAL_TABLET | ORAL | Status: AC
  Filled 2014-04-09: qty 2

## 2014-04-09 MED ORDER — LEVOFLOXACIN 750 MG PO TABS
750.0000 mg | ORAL_TABLET | Freq: Every day | ORAL | Status: DC
  Filled 2014-04-09: qty 1

## 2014-04-09 MED ORDER — POTASSIUM CHLORIDE 10 MEQ/100ML IV SOLN
10.0000 meq | INTRAVENOUS | Status: AC
  Administered 2014-04-09 (×6): 10 meq via INTRAVENOUS
  Filled 2014-04-09 (×6): qty 100

## 2014-04-09 MED ORDER — LOPERAMIDE HCL 2 MG PO CAPS
2.0000 mg | ORAL_CAPSULE | ORAL | Status: DC | PRN
  Administered 2014-04-10 – 2014-04-11 (×5): 2 mg via ORAL
  Filled 2014-04-09 (×6): qty 1

## 2014-04-09 NOTE — Progress Notes (Signed)
CRITICAL VALUE ALERT  Critical value received:  Blood cultures Gram negative rods in anaerobic  Date of notification:  04/09/2014  Time of notification:  6834  Critical value read back: yes  Nurse who received alert:  Audie Pinto  MD notified (1st page):  Tasrif Ahmed  Time of first page:  1143  MD notified (2nd page):  Time of second page:  Responding MD:  Dellia Nims  Time MD responded:  [phone]

## 2014-04-09 NOTE — Progress Notes (Signed)
PULMONARY  / CRITICAL CARE MEDICINE  Name: [name] MRN: [id number] DOB: 12/06/1945    ADMISSION DATE:  04/08/2014 CONSULTATION DATE:  04/09/2014   REFERRING MD :  EDP PRIMARY SERVICE: PCCM  CHIEF COMPLAINT:  Back pain with fevers and chills for 2 days. Nausea, vomiting and diarrhea for 1 day.  BRIEF PATIENT DESCRIPTION:  69 year old woman with past medical history of obesity, osteopenia, history of recurrent kidney stones, chronic insomnia, osteoarthritis and history of ovarian cancer in remission for several years. She presents with nausea, vomiting and back pain. She is suspected to have pyelonephritis with septic shock and gastroenteritis.   SIGNIFICANT EVENTS / STUDIES:  1/12 - admitted for septic shock. Started on pressors and abx. Likely urosepsis.   LINES / TUBES: Left IJ, PIV  CULTURES: Blood culture 1/12 >> Urine culture 1/12 >>  ANTIBIOTICS: Levaquin 1/12 >> Vanc 1/12 >>  REVIEW OF SYSTEMS/subjective: No cp, sob. Having loose stools 3x since yesterday, nonbloody. Had facial tingling yesterday but resolved today. Nausea has resolved.  VITAL SIGNS: Temp:  [97.5 F (36.4 C)-99.4 F (37.4 C)] 98.5 F (36.9 C) (01/13 0446) Pulse Rate:  [73-111] 73 (01/13 0600) Resp:  [15-37] 20 (01/13 0600) BP: (69-134)/(31-100) 102/79 mmHg (01/13 0600) SpO2:  [80 %-100 %] 96 % (01/13 0600) Weight:  [85.3 kg (188 lb 0.8 oz)-88.9 kg (195 lb 15.8 oz)] 88.9 kg (195 lb 15.8 oz) (01/13 0500) HEMODYNAMICS: CVP:  [8 mmHg-12 mmHg] 12 mmHg VENTILATOR SETTINGS:   INTAKE / OUTPUT: Intake/Output      01/12 0701 - 01/13 0700   P.O. 240   I.V. (mL/kg) 6885.7 (77.5)   IV Piggyback 650   Total Intake(mL/kg) 7775.7 (87.5)   Urine (mL/kg/hr) 1750 (0.8)   Stool 0 (0)   Total Output 1750   Net +6025.7       Stool Occurrence 8 x     PHYSICAL EXAMINATION: General:  Ill-appearing patient but  Alert and oriented 4. Neuro: Fully alert oriented 4. No focal neurological deficits.  Pupils are equal and reactive bilaterally. HEENT: Dry mucous membranes. Neck is supple. Cardiovascular: normal rhythm. No murmurs or added sounds Lungs:  Clear bilaterally on auscultation. Abdomen:  Nondistended, nontender, bowel sounds present. No palpable masses. Musculoskeletal:  Moves all extremities. Good muscle strength in both upper and lower extremities. Skin: No bruises. No wounds.  LABS:  Recent Labs Lab 04/04/14 0956 04/08/14 0424 04/08/14 0434 04/08/14 0724 04/08/14 0731 04/08/14 1047 04/08/14 1502 04/09/14 0316  HGB 12.9 13.9 16.0*  --   --   --   --  10.1*  WBC 8.2 17.0*  --   --   --   --   --  35.5*  PLT 351.0 196  --   --   --   --   --  133*  NA 143  --  141  --   --   --   --  141  K 4.2  --  3.3*  --   --   --   --  3.0*  CL 110  --  105  --   --   --   --  116*  CO2 26  --   --   --   --   --   --  19  GLUCOSE 86  --  129*  --   --   --   --  87  BUN 11  --  14  --   --   --   --  12  CREATININE 1.0  --  1.10  --   --   --   --  1.11*  CALCIUM 9.2  --   --   --   --   --   --  6.5*  AST 26 41*  --   --   --   --   --   --   ALT 20 19  --   --   --   --   --   --   ALKPHOS 100 151*  --   --   --   --   --   --   BILITOT 0.9 1.1  --   --   --   --   --   --   PROT 7.3 6.8  --   --   --   --   --   --   ALBUMIN 4.0 3.5  --   --   --   --   --   --   APTT  --   --   --   --  52*  --   --   --   INR  --   --   --   --  1.64*  --   --   --   LATICACIDVEN  --   --  5.33* 3.9*  --   --   --   --   TROPONINI  --   --   --   --  0.04*  --   --   --   PROCALCITON  --   --   --   --   --  12.50  --  11.06  PHART  --   --   --   --   --   --  7.334*  --   PCO2ART  --   --   --   --   --   --  21.1*  --   PO2ART  --   --   --   --   --   --  99.0  --    No results for input(s): GLUCAP in the last 168 hours.  CXR: 1/12 > no active pulmonary process.  ASSESSMENT / PLAN:  PULMONARY A: No active issues. Chest x-ray without active pulmonary process. P:   No  active interventions. supp O2 to keep sat >92%.   CARDIOVASCULAR A: Tachycardia. - likely related to sepsis, improving. CAD history. Hypotension due to septic shock - was on pressor, now off. Elevated lactic acid 5.33 P:  Received Aggressive IVF per sepsis protocol - phase 2 order set. BP now better.  MAP goal 65. Off leveo Cortisol level normal. Gave 167m solucortef when lab was pending, now continuing.   RENAL A:  History of recurrent renal stones. ? Recent passage of kidney stone per patient Hypokalemia - likely related to NS Non gap met acidosis - likely related to NS P:   Renal ultrasound scan was normal. Monitor renal function.  Will switch fluid to D5W + 1/2 100cc/hr. Encourage PO intake if able. Then will switch off fluid.  GASTROINTESTINAL A:  Nonbloody gastroenteritis Rule out C. Difficile or viral GE P:   GI pathogen and Cdiff pending. Her diarrhea already resolved. Advanced diet.  HEMATOLOGIC A:  Leukocytosis of 17k >> 35.5K History of ovarian cancer in remission. Mild plt drop P:  Acute increase in WBC could be from solucortef 1x yesterday or from sepsis. She has been  afebrile overnight. Cont lovenox. Monitor Platelet closely. SCDs  INFECTIOUS A:  Septic shock due to? Pyelonephritis Urinalysis with nitrites positive and many bacteria Viral Gastroenteritis. Rule out C. Difficile Allergic to penicillins with hives P:   Blood and urine cultures sent Pct 12.5 >> 11.06 Vancomycin 1/12 >>  Stop 1/13? Since likely urinary source. Levaquin 1/12 >>  Afebrile but WBC increased 35k today, likely 2/2 to solucortef/reactive leukocytosis from stress rather than worsening infection since PCT trending down and remains afebrile.   ENDOCRINE A: no active issues Cortisol level  39.3 P:   Gave 1x 154m solucortef. Will do continue since cortisol level normal.  NEUROLOGIC A: h/o of insomnia. Anxiety with hyperventillation Ab/flank pain - resolved.  P:   Ativan  PRN anxiety Monitor closely. No active interventions at this time.  Summary: doing well, off pressors, afebrile. Stop vanc. Continue levaquin. F/up Ucx. Will transfer out to med-surg with [facility].   TDellia Nims M.D PGY-1 Internal Medicine Pager [phone]   PCCM ATTENDING: I have reviewed pt's initial presentation, consultants notes and hospital database in detail.  The above assessment and plan was formulated under my direction.  In summary: Severe sepsis/septic shock - much improved. Off vasopressors since last night Suspected pyelonephritis  Transfer to med-surg Change levofloxacin to PO and complete 5 more days TRH to assume care as of AM 1/14 and PCCM to sign off. Discussed with Dr MNicolette Bang MD;  PCCM service; Mobile [phone]

## 2014-04-10 LAB — URINE CULTURE: Colony Count: >=100000

## 2014-04-10 LAB — CBC
HCT: 33.3 % — ABNORMAL LOW (ref 36.0–46.0)
Hemoglobin: 11 g/dL — ABNORMAL LOW (ref 12.0–15.0)
MCH: 31.8 pg (ref 26.0–34.0)
MCHC: 33 g/dL (ref 30.0–36.0)
MCV: 96.2 fL (ref 78.0–100.0)
Platelets: 125 10*3/uL — ABNORMAL LOW (ref 150–400)
RBC: 3.46 MIL/uL — ABNORMAL LOW (ref 3.87–5.11)
RDW: 15.5 % (ref 11.5–15.5)
WBC: 20.7 10*3/uL — ABNORMAL HIGH (ref 4.0–10.5)

## 2014-04-10 LAB — BASIC METABOLIC PANEL
Anion gap: 14 (ref 5–15)
BUN: 11 mg/dL (ref 6–23)
CO2: 16 mmol/L — ABNORMAL LOW (ref 19–32)
Calcium: 7.5 mg/dL — ABNORMAL LOW (ref 8.4–10.5)
Chloride: 113 mEq/L — ABNORMAL HIGH (ref 96–112)
Creatinine, Ser: 1.01 mg/dL (ref 0.50–1.10)
GFR calc Af Amer: 66 mL/min — ABNORMAL LOW (ref >90)
GFR calc non Af Amer: 57 mL/min — ABNORMAL LOW (ref >90)
Glucose, Bld: 78 mg/dL (ref 70–99)
Potassium: 3.3 mmol/L — ABNORMAL LOW (ref 3.5–5.1)
Sodium: 143 mmol/L (ref 135–145)

## 2014-04-10 LAB — MAGNESIUM: Magnesium: 1.4 mg/dL — ABNORMAL LOW (ref 1.5–2.5)

## 2014-04-10 LAB — PROCALCITONIN: Procalcitonin: 5.58 ng/mL

## 2014-04-10 MED ORDER — CEFTRIAXONE SODIUM IN DEXTROSE 20 MG/ML IV SOLN
1.0000 g | INTRAVENOUS | Status: DC
  Administered 2014-04-10 – 2014-04-11 (×2): 1 g via INTRAVENOUS
  Filled 2014-04-10 (×2): qty 50

## 2014-04-10 MED ORDER — CLONAZEPAM 0.5 MG PO TABS: 0.2500 mg | ORAL_TABLET | Freq: Two times a day (BID) | ORAL | Status: DC | PRN

## 2014-04-10 MED ORDER — POTASSIUM CHLORIDE CRYS ER 20 MEQ PO TBCR
40.0000 meq | EXTENDED_RELEASE_TABLET | Freq: Once | ORAL | Status: AC
  Administered 2014-04-10: 40 meq via ORAL
  Filled 2014-04-10: qty 2

## 2014-04-10 NOTE — Evaluation (Signed)
Physical Therapy Evaluation Patient Details Name: [name] MRN: [id number] DOB: Oct 08, 1945 Today's Date: 04/10/2014   History of Present Illness  Pt admit with nausea, vomiting and back pain.  Positive for sepsis.    Clinical Impression  Pt admitted with above diagnosis. Pt currently with functional limitations due to the deficits listed below (see PT Problem List). Pt will need to use RW initially at current level.   Pt also will benefit from Outpt PT to return to prior functional level.  Should progress well.  Pt will benefit from skilled PT to increase their independence and safety with mobility to allow discharge to the venue listed below.      Follow Up Recommendations Outpatient PT    Equipment Recommendations  Rolling walker with 5" wheels    Recommendations for Other Services       Precautions / Restrictions Precautions Precautions: Fall Restrictions Weight Bearing Restrictions: No      Mobility  Bed Mobility               General bed mobility comments: Pt sitting in recliner at start of session; did not assess  Transfers Overall transfer level: Needs assistance Equipment used: None Transfers: Sit to/from Stand Sit to Stand: Min assist         General transfer comment: Pt min assist to stand from recliner chair. Pt requiring increased time to complete transfer. Pt steady once achieving standing.   Ambulation/Gait Ambulation/Gait assistance: Min assist   Assistive device: None;Rolling walker (2 wheeled) Gait Pattern/deviations: Step-through pattern;Decreased step length - right;Decreased step length - left;Decreased stride length Gait velocity: Decreased Gait velocity interpretation: Below normal speed for age/gender General Gait Details: Pt initially wanting to ambulate without AD as she has never used one and doesn't think that she needs it. Pt able to ambulate around entire 6N unit without breaks. Pt with some loss of balance particularly when  when being challenged with head turns. PT able to convince pt to try RW and see if she could notice a difference. Pt willing to try and able to ambulate more safely and accept challenges when using RW. At end of session, pt and PT in agreement that pt should use RW at initial discharge for a week or two.   Stairs            Wheelchair Mobility    Modified Rankin (Stroke Patients Only)       Balance Overall balance assessment: Needs assistance         Standing balance support: During functional activity;No upper extremity supported Standing balance-Leahy Scale: Fair Standing balance comment: Pt able to ambulate without use of RW but unable to safely perform challenges. Requiring RW for safe ambulation.              High level balance activites: Direction changes;Turns;Head turns;Other (comment) (Pt had difficulty with several aspects of DGI.  Needs min guard assist for challenges.)               Pertinent Vitals/Pain Pain Assessment: No/denies pain    Home Living Family/patient expects to be discharged to:: Private residence Living Arrangements: Spouse/significant other Available Help at Discharge: Family;Available 24 hours/day Type of Home: House Home Access: Stairs to enter   [employer] of Steps: 4 Home Layout: One level Home Equipment: None      Prior Function Level of Independence: Independent         Comments: Pt lives very busy, actively lifestyle. 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 Still driving.      Hand Dominance        Extremity/Trunk Assessment   Upper Extremity Assessment: Defer to OT evaluation           Lower Extremity Assessment: Overall WFL for tasks assessed         Communication   Communication: No difficulties  Cognition Arousal/Alertness: Awake/alert Behavior During Therapy: WFL for tasks assessed/performed Overall Cognitive Status: Within Functional Limits for tasks  assessed                      General Comments      Exercises General Exercises - Lower Extremity Ankle Circles/Pumps: AROM;Both;10 reps;Supine Heel Slides: AROM;Both;10 reps;Seated      Assessment/Plan    PT Assessment Patient needs continued PT services  PT Diagnosis Difficulty walking   PT Problem List Decreased strength;Decreased activity tolerance;Decreased balance;Decreased mobility;Decreased knowledge of use of DME;Decreased safety awareness  PT Treatment Interventions DME instruction;Gait training;Stair training;Functional mobility training;Therapeutic activities;Therapeutic exercise;Balance training;Patient/family education   PT Goals (Current goals can be found in the Care Plan section) Acute Rehab PT Goals Patient Stated Goal: Return to PLOF PT Goal Formulation: With patient Time For Goal Achievement: 04/24/14 Potential to Achieve Goals: Good    Frequency Min 3X/week   Barriers to discharge        Co-evaluation               End of Session Equipment Utilized During Treatment: Gait belt Activity Tolerance: Patient tolerated treatment well Patient left: in chair;with call bell/phone within reach           Time: 1209-1224 PT Time Calculation (min) (ACUTE ONLY): 15 min   Charges:   PT Evaluation $Initial PT Evaluation Tier I: 1 Procedure PT Treatments $Gait Training: 8-22 mins   PT G CodesDenice Paradise 05-06-14, 1:04 PM [employer] Acute Rehabilitation [phone] (pager)

## 2014-04-10 NOTE — Progress Notes (Signed)
[facility] PROGRESS NOTE  SHANEIKA ROSSA JJK:[id number] DOB: May 09, 1945 DOA: 04/08/2014 PCP: [name], MD  Assessment/Plan:  Summary  [name] is a 69 yo female with PMH of recurrent kidney stones, obesity, osteopenia, chronic insomnia, osteoarthritis, and hx of ovarian cancer that rpesented to the ED 1/12 with nausea, vomiting, and back pain. Patient attributed her pain to passing of renal stone.  In ED, she was found to be in septic shock and was found to have a UTI. CXR and renal US with no acute abnormalities.  She was then admitted to the ICU, and had central line placed. She was placed on aggressive fluid resuscitation, IV antibiotics, and phenylephrine infusion, with much improvement in status.  Patient was then transferred to floor on 04/10/13.      Septic shock  -Resolved- remains afebrile.  Had evidence of leukocytosis, tachycardia, hypotension, elevated LA, and urine source on infection.  Pt was admitted to ICU, and was started on aggressive IV fluid resuscitation, IV antibiotics, and phenylephrine infusion, with much improvement in status.   -CXR- no abnormalities -Blood cxs-  no growth to date -PT eval pending  UTI -Asymptomatic -UA- pos nitrite and bacteria;  Urine cultures with Ecoli. -Continue Rocephin (day 1)  Nonbloody gastroenteritis -Presented with N/V/D- N/V resolved, continues with diarrhea. - Advanced to heart healthy diet- tolerating well -Cdiff negative -GI panel pending -Continue Immodium, Phenergan PRN  Hypokalemia -K 3.3-replete with KDUR 47meq -Repeat BMET in am  Leukocytosis -Acute increase in WBC from 17k>>35.5K- now down-trending to 20.7 -remains afebrile, procalcitonin is downtrending.  Anticipate rise from Hydrocotisone received -Repeat CBC pending  Anxiety -Klonopin PRN  Sleep Apnea -Ambien PRN  HX of OVarian CA -In remission   Hx of renal stones -Renal US- with no hydronephrosis/ stone  Obesity BMI  34.4  DVT Prophylaxis SQ Lovenox Code Status: Full Family Communication: No family at bedside Disposition Plan: Inpatient   Consultants:  None  Procedures:  None  Antibiotics:  IV Rocephin 1/14>>>  IV Levaquin 1/12>>04/09/14  IV Vancomycin 1/12>>04/10/14   Subjective: Denies any N/V, with continued diarrhea.    Objective: Filed Vitals:   04/10/14 0543  BP: 136/76  Pulse: 88  Temp: 98.6 F (37 C)  Resp: 18    Intake/Output Summary (Last 24 hours) at 04/10/14 0848 Last data filed at 04/10/14 0313  Gross per 24 hour  Intake 1731.67 ml  Output     60 ml  Net 1671.67 ml   Filed Weights   04/08/14 0900 04/09/14 0500 04/10/14 0543  Weight: 85.3 kg (188 lb 0.8 oz) 88.9 kg (195 lb 15.8 oz) 39.78 kg (87 lb 11.2 oz)    Exam:  Gen: Alert Caucasian female in NAD.   HEENT: Normocephalic, atraumatic.  Pupils symmertrical.  Dry mucosa.   Chest: clear to auscultate bilaterally, no ronchi or rales  Cardiac: Regular rate and rhythm, S1-S2, no rubs murmurs or gallops  Abdomen: soft, non tender, non distended, +bowel sounds. No guarding or rigidity  Extremities: Symmetrical in appearance without cyanosis or edema  Neurological: Alert awake oriented to time place and person.  Psychiatric: Appears normal.   Data Reviewed: Basic Metabolic Panel:  Recent Labs Lab 04/04/14 0956 04/08/14 0434 04/09/14 0316  NA 143 141 141  K 4.2 3.3* 3.0*  CL 110 105 116*  CO2 26  --  19  GLUCOSE 86 129* 87  BUN 11 14 12   CREATININE 1.0 1.10 1.11*  CALCIUM 9.2  --  6.5*   Liver Function  Tests:  Recent Labs Lab 04/04/14 0956 04/08/14 0424  AST 26 41*  ALT 20 19  ALKPHOS 100 151*  BILITOT 0.9 1.1  PROT 7.3 6.8  ALBUMIN 4.0 3.5    Recent Labs Lab 04/08/14 0424  LIPASE 26   No results for input(s): AMMONIA in the last 168 hours. CBC:  Recent Labs Lab 04/04/14 0956 04/08/14 0424 04/08/14 0434 04/09/14 0316  WBC 8.2 17.0*  --  35.5*  NEUTROABS 5.1 16.5*  --   --    HGB 12.9 13.9 16.0* 10.1*  HCT 40.6 42.7 47.0* 30.3*  MCV 97.1 96.6  --  97.1  PLT 351.0 196  --  133*   Cardiac Enzymes:  Recent Labs Lab 04/08/14 0731  TROPONINI 0.04*   BNP (last 3 results) No results for input(s): PROBNP in the last 8760 hours. CBG: No results for input(s): GLUCAP in the last 168 hours.  Recent Results (from the past 240 hour(s))  Blood culture (routine x 2)     Status: None (Preliminary result)   Collection Time: 04/08/14  4:24 AM  Result Value Ref Range Status   Specimen Description BLOOD LEFT ANTECUBITAL  Final   Special Requests BOTTLES DRAWN AEROBIC AND ANAEROBIC 5CC EA  Final   Culture   Final    GRAM NEGATIVE RODS Note: Gram Stain Report Called to,Read Back By and Verified With: SAMANTHA HARDY 04/09/14 1135 BY SMITHERSJ Performed at [facility]    Report Status PENDING  Incomplete  Urine culture     Status: None   Collection Time: 04/08/14  4:37 AM  Result Value Ref Range Status   Specimen Description URINE, CATHETERIZED  Final   Special Requests ADDED 0630  Final   Colony Count   Final    >=100,000 COLONIES/ML Performed at [facility]    Culture   Final    ESCHERICHIA COLI Performed at [facility]    Report Status 04/10/2014 FINAL  Final   Organism ID, Bacteria ESCHERICHIA COLI  Final      Susceptibility   Escherichia coli - MIC*    AMPICILLIN 4 SENSITIVE Sensitive     CEFAZOLIN <=4 SENSITIVE Sensitive     CEFTRIAXONE <=1 SENSITIVE Sensitive     CIPROFLOXACIN >=4 RESISTANT Resistant     GENTAMICIN <=1 SENSITIVE Sensitive     LEVOFLOXACIN >=8 RESISTANT Resistant     NITROFURANTOIN <=16 SENSITIVE Sensitive     TOBRAMYCIN <=1 SENSITIVE Sensitive     TRIMETH/SULFA <=20 SENSITIVE Sensitive     PIP/TAZO <=4 SENSITIVE Sensitive     * ESCHERICHIA COLI  Blood culture (routine x 2)     Status: None (Preliminary result)   Collection Time: 04/08/14  4:47 AM  Result Value Ref Range Status   Specimen Description  BLOOD LEFT HAND  Final   Special Requests BOTTLES DRAWN AEROBIC ONLY 3CC  Final   Culture   Final           BLOOD CULTURE RECEIVED NO GROWTH TO DATE CULTURE WILL BE HELD FOR 5 DAYS BEFORE ISSUING A FINAL NEGATIVE REPORT Performed at [facility]    Report Status PENDING  Incomplete  MRSA PCR Screening     Status: None   Collection Time: 04/08/14  8:59 AM  Result Value Ref Range Status   MRSA by PCR NEGATIVE NEGATIVE Final    Comment:        The GeneXpert MRSA Assay (FDA approved for NASAL specimens only), is one component of  a comprehensive MRSA colonization surveillance program. It is not intended to diagnose MRSA infection nor to guide or monitor treatment for MRSA infections.   Clostridium Difficile by PCR     Status: None   Collection Time: 04/08/14  6:20 PM  Result Value Ref Range Status   C difficile by pcr NEGATIVE NEGATIVE Final     Studies: US Renal  04/08/2014   CLINICAL DATA:  Renal stone  EXAM: RENAL/URINARY TRACT ULTRASOUND COMPLETE  COMPARISON:  CT 12/12/2012  FINDINGS: Right Kidney:  Length: 10.4 cm. Echogenicity within normal limits. No mass or hydronephrosis visualized.  Left Kidney:  Length: 10.7 cm. Echogenicity within normal limits. No mass or hydronephrosis visualized.  Bladder:  Appears normal for degree of bladder distention.  IMPRESSION: No acute findings.  No hydronephrosis.  No visible stones.   Electronically Signed   By: Rolm Baptise M.D.   On: 04/08/2014 10:27   Dg Chest Port 1 View  04/08/2014   CLINICAL DATA:  Central line placement.  EXAM: PORTABLE CHEST - 1 VIEW  COMPARISON:  04/08/2014  FINDINGS: Left central line tip is in the SVC. No pneumothorax. Heart is normal size. Mild interstitial prominence throughout the lungs could reflect mild interstitial edema. No confluent opacities or effusions. No acute bony abnormality.  IMPRESSION: Left central line tip is in the SVC.  No pneumothorax.  New interstitial prominence diffusely throughout the  lungs, question edema. Heart size is normal.   Electronically Signed   By: Rolm Baptise M.D.   On: 04/08/2014 11:43    Scheduled Meds: . cefTRIAXone (ROCEPHIN)  IV  1 g Intravenous Q24H  . enoxaparin (LOVENOX) injection  40 mg Subcutaneous Q24H   Continuous Infusions:   Principal Problem:   Septic shock Active Problems:   Headache   Chronic insomnia   Kidney stones   Urinary tract infection   Severe sepsis with septic shock   UTI (lower urinary tract infection)   Lactic acidosis    Time spent: [place], [place] [facility]  [facility] Pager [phone]. If 7PM-7AM, please contact night-coverage at [email or web address], password [facility] 04/10/2014, 8:48 AM  LOS: 2 days

## 2014-04-11 LAB — CBC
HCT: 36.8 % (ref 36.0–46.0)
Hemoglobin: 12.1 g/dL (ref 12.0–15.0)
MCH: 31.2 pg (ref 26.0–34.0)
MCHC: 32.9 g/dL (ref 30.0–36.0)
MCV: 94.8 fL (ref 78.0–100.0)
Platelets: 139 10*3/uL — ABNORMAL LOW (ref 150–400)
RBC: 3.88 MIL/uL (ref 3.87–5.11)
RDW: 15.5 % (ref 11.5–15.5)
WBC: 13.5 10*3/uL — ABNORMAL HIGH (ref 4.0–10.5)

## 2014-04-11 LAB — BASIC METABOLIC PANEL
Anion gap: 6 (ref 5–15)
BUN: 6 mg/dL (ref 6–23)
CO2: 20 mmol/L (ref 19–32)
Calcium: 8 mg/dL — ABNORMAL LOW (ref 8.4–10.5)
Chloride: 116 mEq/L — ABNORMAL HIGH (ref 96–112)
Creatinine, Ser: 0.92 mg/dL (ref 0.50–1.10)
GFR calc Af Amer: 74 mL/min — ABNORMAL LOW (ref >90)
GFR calc non Af Amer: 63 mL/min — ABNORMAL LOW (ref >90)
Glucose, Bld: 92 mg/dL (ref 70–99)
Potassium: 3.2 mmol/L — ABNORMAL LOW (ref 3.5–5.1)
Sodium: 142 mmol/L (ref 135–145)

## 2014-04-11 LAB — TSH: TSH: 2.873 u[IU]/mL (ref 0.350–4.500)

## 2014-04-11 LAB — TROPONIN I: Troponin I: 0.12 ng/mL — ABNORMAL HIGH (ref <0.031)

## 2014-04-11 MED ORDER — CEFTRIAXONE SODIUM IN DEXTROSE 40 MG/ML IV SOLN
2.0000 g | INTRAVENOUS | Status: DC
  Administered 2014-04-12 – 2014-04-13 (×2): 2 g via INTRAVENOUS
  Filled 2014-04-11 (×2): qty 50

## 2014-04-11 MED ORDER — POTASSIUM CHLORIDE CRYS ER 20 MEQ PO TBCR
40.0000 meq | EXTENDED_RELEASE_TABLET | ORAL | Status: AC
  Administered 2014-04-11 (×2): 40 meq via ORAL
  Filled 2014-04-11 (×2): qty 2

## 2014-04-11 MED ORDER — KCL-LACTATED RINGERS 20 MEQ/L IV SOLN
INTRAVENOUS | Status: DC
  Administered 2014-04-11 (×2): via INTRAVENOUS
  Filled 2014-04-11 (×4): qty 1000

## 2014-04-11 MED ORDER — ASPIRIN 81 MG PO CHEW
81.0000 mg | CHEWABLE_TABLET | Freq: Every day | ORAL | Status: DC
  Administered 2014-04-11 – 2014-04-13 (×3): 81 mg via ORAL
  Filled 2014-04-11 (×3): qty 1

## 2014-04-11 MED ORDER — LOPERAMIDE HCL 2 MG PO CAPS
2.0000 mg | ORAL_CAPSULE | ORAL | Status: DC | PRN
  Administered 2014-04-11 – 2014-04-12 (×3): 2 mg via ORAL
  Filled 2014-04-11 (×4): qty 1

## 2014-04-11 MED ORDER — METOPROLOL TARTRATE 25 MG PO TABS
25.0000 mg | ORAL_TABLET | Freq: Two times a day (BID) | ORAL | Status: DC
  Administered 2014-04-12: 25 mg via ORAL
  Filled 2014-04-11 (×5): qty 1

## 2014-04-11 NOTE — Telephone Encounter (Signed)
Pt informed

## 2014-04-11 NOTE — Progress Notes (Signed)
[facility] PROGRESS NOTE  SRI CLEGG ZOX:[id number] DOB: 29-Oct-1945 DOA: 04/08/2014 PCP: [name], MD  Assessment/Plan:  Summary  [name] is a 69 yo female with PMH of recurrent kidney stones, obesity, osteopenia, chronic insomnia, osteoarthritis, and hx of ovarian cancer that rpesented to the ED 1/12 with nausea, vomiting, and back pain. Patient attributed her pain to passing of renal stone.  In ED, she was found to be in septic shock and was found to have a UTI. CXR and renal US with no acute abnormalities.  She was then admitted to the ICU, and had central line placed. She was placed on aggressive fluid resuscitation, IV antibiotics, and phenylephrine infusion, with much improvement in status.  Patient was then transferred to floor on 04/10/13.      Septic shock due to UTI  -Septic shock resolved, initial treatment in ICU under the care of pulmonary critical care she required close to 6 L normal saline. Hemodynamically stable and nontoxic appearing, blood cultures negative, urine cultures noted and she has been switched to Rocephin on 04/10/2014. Continue with supportive care and monitor closely   Nonbloody gastroenteritis -likely gastroenteritis. C. difficile negative. Imodium when necessary. Pending GI pathogen panel.  Hypokalemia -Replace and recheck with magnesium levels in the morning.  Leukocytosis -Due to #1 above. Trending down with Rocephin.  Anxiety -Klonopin PRN  Sleep Apnea -Ambien PRN  HX of OVarian CA -In remission   Hx of renal stones -Renal US- with no hydronephrosis/ stone  Obesity BMI 34.4  Single elevated troponin in ICU on the 12th.  EKG non acute ST changes with possible SVT, we'll repeat another troponin as its clearly over 24 hours, she is chest pain-free, lace on aspirin beta blocker, check echogram for wall motion. Will check TSH as well as there was a question of SVT in the ER.    DVT Prophylaxis SQ Lovenox Code  Status: Full Family Communication: No family at bedside Disposition Plan: Inpatient   Consultants:  None  Procedures:  TTE  Antibiotics:  IV Rocephin 1/14>>>  IV Levaquin 1/12>>04/09/14  IV Vancomycin 1/12>>04/10/14   Subjective: Denies any N/V, with continued diarrhea.    Objective: Filed Vitals:   04/11/14 0519  BP: 108/49  Pulse: 78  Temp: 98 F (36.7 C)  Resp: 18    Intake/Output Summary (Last 24 hours) at 04/11/14 1048 Last data filed at 04/10/14 1700  Gross per 24 hour  Intake    240 ml  Output      0 ml  Net    240 ml   Filed Weights   04/09/14 0500 04/10/14 0543 04/11/14 0500  Weight: 88.9 kg (195 lb 15.8 oz) 39.78 kg (87 lb 11.2 oz) 39.78 kg (87 lb 11.2 oz)    Exam:  Gen: Alert Caucasian female in NAD.   HEENT: Normocephalic, atraumatic.  Pupils symmertrical.  Dry mucosa.   Chest: clear to auscultate bilaterally, no ronchi or rales  Cardiac: Regular rate and rhythm, S1-S2, no rubs murmurs or gallops  Abdomen: soft, non tender, non distended, +bowel sounds. No guarding or rigidity  Extremities: Symmetrical in appearance without cyanosis or edema  Neurological: Alert awake oriented to time place and person.  Psychiatric: Appears normal.   Data Reviewed: Basic Metabolic Panel:  Recent Labs Lab 04/08/14 0434 04/09/14 0316 04/10/14 0551 04/11/14 0601  NA 141 141 143 142  K 3.3* 3.0* 3.3* 3.2*  CL 105 116* 113* 116*  CO2  --  19 16* 20  GLUCOSE 129*  87 78 92  BUN 14 12 11 6   CREATININE 1.10 1.11* 1.01 0.92  CALCIUM  --  6.5* 7.5* 8.0*  MG  --   --  1.4*  --    Liver Function Tests:  Recent Labs Lab 04/08/14 0424  AST 41*  ALT 19  ALKPHOS 151*  BILITOT 1.1  PROT 6.8  ALBUMIN 3.5    Recent Labs Lab 04/08/14 0424  LIPASE 26   No results for input(s): AMMONIA in the last 168 hours. CBC:  Recent Labs Lab 04/08/14 0424 04/08/14 0434 04/09/14 0316 04/10/14 0551 04/11/14 0601  WBC 17.0*  --  35.5* 20.7* 13.5*   NEUTROABS 16.5*  --   --   --   --   HGB 13.9 16.0* 10.1* 11.0* 12.1  HCT 42.7 47.0* 30.3* 33.3* 36.8  MCV 96.6  --  97.1 96.2 94.8  PLT 196  --  133* 125* 139*   Cardiac Enzymes:  Recent Labs Lab 04/08/14 0731  TROPONINI 0.04*   BNP (last 3 results) No results for input(s): PROBNP in the last 8760 hours. CBG: No results for input(s): GLUCAP in the last 168 hours.  Recent Results (from the past 240 hour(s))  Blood culture (routine x 2)     Status: None (Preliminary result)   Collection Time: 04/08/14  4:24 AM  Result Value Ref Range Status   Specimen Description BLOOD LEFT ANTECUBITAL  Final   Special Requests BOTTLES DRAWN AEROBIC AND ANAEROBIC 5CC EA  Final   Culture   Final    GRAM NEGATIVE RODS Note: Gram Stain Report Called to,Read Back By and Verified With: SAMANTHA HARDY 04/09/14 1135 BY SMITHERSJ Performed at [facility]    Report Status PENDING  Incomplete  Urine culture     Status: None   Collection Time: 04/08/14  4:37 AM  Result Value Ref Range Status   Specimen Description URINE, CATHETERIZED  Final   Special Requests ADDED 0630  Final   Colony Count   Final    >=100,000 COLONIES/ML Performed at [facility]    Culture   Final    ESCHERICHIA COLI Performed at [facility]    Report Status 04/10/2014 FINAL  Final   Organism ID, Bacteria ESCHERICHIA COLI  Final      Susceptibility   Escherichia coli - MIC*    AMPICILLIN 4 SENSITIVE Sensitive     CEFAZOLIN <=4 SENSITIVE Sensitive     CEFTRIAXONE <=1 SENSITIVE Sensitive     CIPROFLOXACIN >=4 RESISTANT Resistant     GENTAMICIN <=1 SENSITIVE Sensitive     LEVOFLOXACIN >=8 RESISTANT Resistant     NITROFURANTOIN <=16 SENSITIVE Sensitive     TOBRAMYCIN <=1 SENSITIVE Sensitive     TRIMETH/SULFA <=20 SENSITIVE Sensitive     PIP/TAZO <=4 SENSITIVE Sensitive     * ESCHERICHIA COLI  Blood culture (routine x 2)     Status: None (Preliminary result)   Collection Time: 04/08/14  4:47 AM   Result Value Ref Range Status   Specimen Description BLOOD LEFT HAND  Final   Special Requests BOTTLES DRAWN AEROBIC ONLY 3CC  Final   Culture   Final           BLOOD CULTURE RECEIVED NO GROWTH TO DATE CULTURE WILL BE HELD FOR 5 DAYS BEFORE ISSUING A FINAL NEGATIVE REPORT Performed at [facility]    Report Status PENDING  Incomplete  MRSA PCR Screening     Status: None   Collection Time:  04/08/14  8:59 AM  Result Value Ref Range Status   MRSA by PCR NEGATIVE NEGATIVE Final    Comment:        The GeneXpert MRSA Assay (FDA approved for NASAL specimens only), is one component of a comprehensive MRSA colonization surveillance program. It is not intended to diagnose MRSA infection nor to guide or monitor treatment for MRSA infections.   Clostridium Difficile by PCR     Status: None   Collection Time: 04/08/14  6:20 PM  Result Value Ref Range Status   C difficile by pcr NEGATIVE NEGATIVE Final  Culture, blood (routine x 2)     Status: None (Preliminary result)   Collection Time: 04/09/14  1:00 PM  Result Value Ref Range Status   Specimen Description BLOOD RIGHT ANTECUBITAL  Final   Special Requests BOTTLES DRAWN AEROBIC ONLY 10CC  Final   Culture   Final           BLOOD CULTURE RECEIVED NO GROWTH TO DATE CULTURE WILL BE HELD FOR 5 DAYS BEFORE ISSUING A FINAL NEGATIVE REPORT Performed at [facility]    Report Status PENDING  Incomplete  Culture, blood (routine x 2)     Status: None (Preliminary result)   Collection Time: 04/09/14  1:06 PM  Result Value Ref Range Status   Specimen Description BLOOD LEFT ANTECUBITAL  Final   Special Requests BOTTLES DRAWN AEROBIC ONLY 10CC  Final   Culture   Final           BLOOD CULTURE RECEIVED NO GROWTH TO DATE CULTURE WILL BE HELD FOR 5 DAYS BEFORE ISSUING A FINAL NEGATIVE REPORT Performed at [facility]    Report Status PENDING  Incomplete     Studies: No results found.  Scheduled Meds: . cefTRIAXone  (ROCEPHIN)  IV  1 g Intravenous Q24H  . enoxaparin (LOVENOX) injection  40 mg Subcutaneous Q24H  . potassium chloride  40 mEq Oral Q4H   Continuous Infusions: . lactated ringers with KCl 20 mEq/L      Principal Problem:   Septic shock Active Problems:   Headache   Chronic insomnia   Kidney stones   Urinary tract infection   Severe sepsis with septic shock   UTI (lower urinary tract infection)   Lactic acidosis    Time spent: 50   SINGH,PRASHANT K M.D on 04/11/2014 at 10:48 AM  Between 7am to 7pm - Pager - [phone], After 7pm go to [email or web address] - password [email or web address]  And look for the night coverage person covering me after hours  [place]  [phone]   04/11/2014, 10:48 AM  LOS: 3 days

## 2014-04-11 NOTE — Telephone Encounter (Signed)
Pt would like to know her CA 125 and all her other labs as well. Pt aware of ambien approval. transferred to [employer]

## 2014-04-11 NOTE — Care Management (Signed)
IM from Medicine given. Anaisabel Pederson RN BSN  

## 2014-04-11 NOTE — Progress Notes (Signed)
Physical Therapy Treatment Patient Details Name: [name] MRN: [id number] DOB: 1945-10-25 Today's Date: 04/11/2014    History of Present Illness Pt admit with nausea, vomiting and back pain.  Positive for sepsis.      PT Comments    Pt progressing with all aspects of therapy and is able to ambulate with RW at mod I level.  Distant S for gait without device in hallway for safety and decreased balance.  Provided education on higher level balance activities to do at home in corner of room for increased safety.    Follow Up Recommendations  Outpatient PT     Equipment Recommendations  Rolling walker with 5" wheels    Recommendations for Other Services       Precautions / Restrictions Precautions Precautions: Fall    Mobility  Bed Mobility               General bed mobility comments: Pt leaving room when PT arrived  Transfers Overall transfer level: Needs assistance Equipment used: Rolling walker (2 wheeled);None Transfers: Sit to/from Stand Sit to Stand: Modified independent (Device/Increase time)         General transfer comment: Good use of UEs when sitting/standing  Ambulation/Gait Ambulation/Gait assistance: Modified independent (Device/Increase time) Ambulation Distance (Feet): 500 Feet Assistive device: None;Rolling walker (2 wheeled) Gait Pattern/deviations: Step-through pattern;Wide base of support;Drifts right/left Gait velocity: Decreased   General Gait Details: Initially ambulating with RW x 500' around unit.  Pt able to do so at mod I level.  Then had pt ambulate approx 100' without RW to perform high level balance with head turns R and L and up/down.  No overt LOB, but pt does have to decrease gait speed.    Stairs Stairs: Yes Stairs assistance: Supervision Stair Management: One rail Left;Alternating pattern;Step to pattern;Forwards Number of Stairs: 12 General stair comments: Pt able to perform at S level for safety.  Good understanding  of when to use step to vs alternating pattern.   Wheelchair Mobility    Modified Rankin (Stroke Patients Only)       Balance Overall balance assessment: Needs assistance Sitting-balance support: Feet supported Sitting balance-Leahy Scale: Normal     Standing balance support: During functional activity;No upper extremity supported Standing balance-Leahy Scale: Good               High level balance activites: Turns;Head turns;Direction changes High Level Balance Comments: Requires S for these tasks without AD for intermittent staggering but no LOB noted.   Also performed standing on pillow in room in corner with feet together, EO, EC performing head turns up/down, side/side.  Educated that she could do these at home in corner for increased safety.     Cognition Arousal/Alertness: Awake/alert Behavior During Therapy: WFL for tasks assessed/performed Overall Cognitive Status: Within Functional Limits for tasks assessed                      Exercises  Performed standing heel/toe raises x 10 reps, standing knee flex x 10 reps, standing hip abd x 10 reps, and standing marching x 10 reps BLE.     General Comments        Pertinent Vitals/Pain Pain Assessment: No/denies pain    Home Living                      Prior Function            PT Goals (current goals can now  be found in the care plan section) Acute Rehab PT Goals Patient Stated Goal: Return to PLOF PT Goal Formulation: With patient Time For Goal Achievement: 04/24/14 Potential to Achieve Goals: Good Progress towards PT goals: Progressing toward goals    Frequency  Min 3X/week    PT Plan Current plan remains appropriate    Co-evaluation             End of Session   Activity Tolerance: Patient tolerated treatment well Patient left: in chair;with call bell/phone within reach     Time: 1320-1344 PT Time Calculation (min) (ACUTE ONLY): 24 min  Charges:  $Gait Training: 8-22  mins $Neuromuscular Re-education: 8-22 mins                    G Codes:      [name] 04/11/2014, 3:02 PM

## 2014-04-12 DIAGNOSIS — I059 Rheumatic mitral valve disease, unspecified: Secondary | ICD-10-CM | POA: Diagnosis not present

## 2014-04-12 LAB — BASIC METABOLIC PANEL
Anion gap: 6 (ref 5–15)
BUN: 7 mg/dL (ref 6–23)
CO2: 23 mmol/L (ref 19–32)
Calcium: 8.1 mg/dL — ABNORMAL LOW (ref 8.4–10.5)
Chloride: 114 mEq/L — ABNORMAL HIGH (ref 96–112)
Creatinine, Ser: 0.88 mg/dL (ref 0.50–1.10)
GFR calc Af Amer: 78 mL/min — ABNORMAL LOW (ref >90)
GFR calc non Af Amer: 67 mL/min — ABNORMAL LOW (ref >90)
Glucose, Bld: 89 mg/dL (ref 70–99)
Potassium: 3.7 mmol/L (ref 3.5–5.1)
Sodium: 143 mmol/L (ref 135–145)

## 2014-04-12 LAB — TROPONIN I
Troponin I: 0.06 ng/mL — ABNORMAL HIGH (ref <0.031)
Troponin I: 0.04 ng/mL — ABNORMAL HIGH (ref <0.031)
Troponin I: 0.05 ng/mL — ABNORMAL HIGH (ref <0.031)

## 2014-04-12 LAB — CULTURE, BLOOD (ROUTINE X 2)

## 2014-04-12 LAB — MAGNESIUM: Magnesium: 1.6 mg/dL (ref 1.5–2.5)

## 2014-04-12 NOTE — Progress Notes (Signed)
[facility] PROGRESS NOTE  [name] NFA:[id number] DOB: January 09, 1946 DOA: 04/08/2014 PCP: Christina Post, MD  Assessment/Plan:  Summary  [name] is a 69 yo female with PMH of recurrent kidney stones, obesity, osteopenia, chronic insomnia, osteoarthritis, and hx of ovarian cancer that rpesented to the ED 1/12 with nausea, vomiting, and back pain. Patient attributed her pain to passing of renal stone.  In ED, she was found to be in septic shock and was found to have a UTI. CXR and renal US with no acute abnormalities.  She was then admitted to the ICU, and had central line placed. She was placed on aggressive fluid resuscitation, IV antibiotics, and phenylephrine infusion, with much improvement in status.  Patient was then transferred to floor on 04/10/13.      Septic shock due to UTI  -Septic shock resolved, initial treatment in ICU under the care of pulmonary critical care she required close to 6 L normal saline. Hemodynamically stable and nontoxic appearing, blood cultures negative, urine cultures noted and she has been switched to Rocephin on 04/10/2014. Continue with supportive care and monitor closely   Nonbloody gastroenteritis -likely gastroenteritis. C. difficile negative. Imodium when necessary. Pending GI pathogen panel.  Hypokalemia -Replace and recheck with magnesium levels in the morning.  Leukocytosis -Due to #1 above. Trending down with Rocephin.  Anxiety -Klonopin PRN  Sleep Apnea -Ambien PRN  HX of OVarian CA -In remission   Hx of renal stones -Renal US- with no hydronephrosis/ stone  Obesity BMI 34.4  Single elevated troponin in ICU on the 12th.  EKG non acute ST changes with possible SVT, we'll repeat another troponin as its clearly over 24 hours, she is chest pain-free, lace on aspirin beta blocker, check echogram for wall motion. [name] check TSH as well as there was a question of SVT in the ER.    DVT Prophylaxis SQ Lovenox Code  Status: Full Family Communication: No family at bedside Disposition Plan: Inpatient   Consultants:  None  Procedures:  TTE  Antibiotics:  IV Rocephin 1/14>>>  IV Levaquin 1/12>>04/09/14  IV Vancomycin 1/12>>04/10/14   Subjective: Denies any N/V, with continued diarrhea.    Objective: Filed Vitals:   04/12/14 0621  BP: 129/54  Pulse: 76  Temp: 98.2 F (36.8 C)  Resp: 16    Intake/Output Summary (Last 24 hours) at 04/12/14 1057 Last data filed at 04/12/14 0900  Gross per 24 hour  Intake    740 ml  Output      0 ml  Net    740 ml   Filed Weights   04/09/14 0500 04/10/14 0543 04/11/14 0500  Weight: 88.9 kg (195 lb 15.8 oz) 88.451 kg (195 lb) 39.78 kg (87 lb 11.2 oz)    Exam:  Gen: Alert Caucasian female in NAD.   HEENT: Normocephalic, atraumatic.  Pupils symmertrical.  Dry mucosa.   Chest: clear to auscultate bilaterally, no ronchi or rales  Cardiac: Regular rate and rhythm, S1-S2, no rubs murmurs or gallops  Abdomen: soft, non tender, non distended, +bowel sounds. No guarding or rigidity  Extremities: Symmetrical in appearance without cyanosis or edema  Neurological: Alert awake oriented to time place and person.  Psychiatric: Appears normal.   Data Reviewed: Basic Metabolic Panel:  Recent Labs Lab 04/08/14 0434 04/09/14 0316 04/10/14 0551 04/11/14 0601 04/12/14 0358  NA 141 141 143 142 143  K 3.3* 3.0* 3.3* 3.2* 3.7  CL 105 116* 113* 116* 114*  CO2  --  19 16* 20  23  GLUCOSE 129* 87 78 92 89  BUN 14 12 11 6 7   CREATININE 1.10 1.11* 1.01 0.92 0.88  CALCIUM  --  6.5* 7.5* 8.0* 8.1*  MG  --   --  1.4*  --  1.6   Liver Function Tests:  Recent Labs Lab 04/08/14 0424  AST 41*  ALT 19  ALKPHOS 151*  BILITOT 1.1  PROT 6.8  ALBUMIN 3.5    Recent Labs Lab 04/08/14 0424  LIPASE 26   No results for input(s): AMMONIA in the last 168 hours. CBC:  Recent Labs Lab 04/08/14 0424 04/08/14 0434 04/09/14 0316 04/10/14 0551 04/11/14 0601   WBC 17.0*  --  35.5* 20.7* 13.5*  NEUTROABS 16.5*  --   --   --   --   HGB 13.9 16.0* 10.1* 11.0* 12.1  HCT 42.7 47.0* 30.3* 33.3* 36.8  MCV 96.6  --  97.1 96.2 94.8  PLT 196  --  133* 125* 139*   Cardiac Enzymes:  Recent Labs Lab 04/08/14 0731 04/11/14 1129  TROPONINI 0.04* 0.12*   BNP (last 3 results) No results for input(s): PROBNP in the last 8760 hours. CBG: No results for input(s): GLUCAP in the last 168 hours.  Recent Results (from the past 240 hour(s))  Blood culture (routine x 2)     Status: None (Preliminary result)   Collection Time: 04/08/14  4:24 AM  Result Value Ref Range Status   Specimen Description BLOOD LEFT ANTECUBITAL  Final   Special Requests BOTTLES DRAWN AEROBIC AND ANAEROBIC 5CC EA  Final   Culture   Final    GRAM NEGATIVE RODS Note: Gram Stain Report Called to,Read Back By and Verified With: SAMANTHA HARDY 04/09/14 1135 BY SMITHERSJ Performed at [facility]    Report Status PENDING  Incomplete  Urine culture     Status: None   Collection Time: 04/08/14  4:37 AM  Result Value Ref Range Status   Specimen Description URINE, CATHETERIZED  Final   Special Requests ADDED 0630  Final   Colony Count   Final    >=100,000 COLONIES/ML Performed at [facility]    Culture   Final    ESCHERICHIA COLI Performed at [facility]    Report Status 04/10/2014 FINAL  Final   Organism ID, Bacteria ESCHERICHIA COLI  Final      Susceptibility   Escherichia coli - MIC*    AMPICILLIN 4 SENSITIVE Sensitive     CEFAZOLIN <=4 SENSITIVE Sensitive     CEFTRIAXONE <=1 SENSITIVE Sensitive     CIPROFLOXACIN >=4 RESISTANT Resistant     GENTAMICIN <=1 SENSITIVE Sensitive     LEVOFLOXACIN >=8 RESISTANT Resistant     NITROFURANTOIN <=16 SENSITIVE Sensitive     TOBRAMYCIN <=1 SENSITIVE Sensitive     TRIMETH/SULFA <=20 SENSITIVE Sensitive     PIP/TAZO <=4 SENSITIVE Sensitive     * ESCHERICHIA COLI  Blood culture (routine x 2)     Status: None  (Preliminary result)   Collection Time: 04/08/14  4:47 AM  Result Value Ref Range Status   Specimen Description BLOOD LEFT HAND  Final   Special Requests BOTTLES DRAWN AEROBIC ONLY 3CC  Final   Culture   Final           BLOOD CULTURE RECEIVED NO GROWTH TO DATE CULTURE [name] BE HELD FOR 5 DAYS BEFORE ISSUING A FINAL NEGATIVE REPORT Performed at [facility]    Report Status PENDING  Incomplete  MRSA  PCR Screening     Status: None   Collection Time: 04/08/14  8:59 AM  Result Value Ref Range Status   MRSA by PCR NEGATIVE NEGATIVE Final    Comment:        The GeneXpert MRSA Assay (FDA approved for NASAL specimens only), is one component of a comprehensive MRSA colonization surveillance program. It is not intended to diagnose MRSA infection nor to guide or monitor treatment for MRSA infections.   Clostridium Difficile by PCR     Status: None   Collection Time: 04/08/14  6:20 PM  Result Value Ref Range Status   C difficile by pcr NEGATIVE NEGATIVE Final  Culture, blood (routine x 2)     Status: None (Preliminary result)   Collection Time: 04/09/14  1:00 PM  Result Value Ref Range Status   Specimen Description BLOOD RIGHT ANTECUBITAL  Final   Special Requests BOTTLES DRAWN AEROBIC ONLY 10CC  Final   Culture   Final           BLOOD CULTURE RECEIVED NO GROWTH TO DATE CULTURE [name] BE HELD FOR 5 DAYS BEFORE ISSUING A FINAL NEGATIVE REPORT Performed at [facility]    Report Status PENDING  Incomplete  Culture, blood (routine x 2)     Status: None (Preliminary result)   Collection Time: 04/09/14  1:06 PM  Result Value Ref Range Status   Specimen Description BLOOD LEFT ANTECUBITAL  Final   Special Requests BOTTLES DRAWN AEROBIC ONLY 10CC  Final   Culture   Final           BLOOD CULTURE RECEIVED NO GROWTH TO DATE CULTURE [name] BE HELD FOR 5 DAYS BEFORE ISSUING A FINAL NEGATIVE REPORT Performed at [facility]    Report Status PENDING  Incomplete      Studies: No results found.  Scheduled Meds: . aspirin  81 mg Oral Daily  . cefTRIAXone (ROCEPHIN)  IV  2 g Intravenous Q24H  . enoxaparin (LOVENOX) injection  40 mg Subcutaneous Q24H  . metoprolol tartrate  25 mg Oral BID   Continuous Infusions:    Principal Problem:   Septic shock Active Problems:   Headache   Chronic insomnia   Kidney stones   Urinary tract infection   Severe sepsis with septic shock   UTI (lower urinary tract infection)   Lactic acidosis    Time spent: 21   [name] K M.D on 04/12/2014 at 10:57 AM  Between 7am to 7pm - Pager - [phone], After 7pm go to [email or web address] - password [email or web address]  And look for the night coverage person covering me after hours  [place]  [phone]   04/12/2014, 10:57 AM  LOS: 4 days             [facility] PROGRESS NOTE  [name] OMV:[id number] DOB: [birth year] DOA: 04/08/2014 PCP: Christina Post, MD  Assessment/Plan:  Summary  [name] is a 69 yo female with PMH of recurrent kidney stones, obesity, osteopenia, chronic insomnia, osteoarthritis, and hx of ovarian cancer that rpesented to the ED 1/12 with nausea, vomiting, and back pain. Patient attributed her pain to passing of renal stone.  In ED, she was found to be in septic shock and was found to have a UTI. CXR and renal US with no acute abnormalities.  She was then admitted to the ICU, and had central line placed. She was placed on aggressive fluid resuscitation, IV antibiotics, and phenylephrine infusion, with much improvement in  status.  Patient was then transferred to floor on 04/10/13.      Septic shock due to UTI  -Septic shock resolved, initial treatment in ICU under the care of pulmonary critical care she required close to 6 L normal saline. Hemodynamically stable and nontoxic appearing, blood cultures negative, urine cultures noted and she has been switched to Rocephin on 04/10/2014. Continue with  supportive care and monitor closely   Nonbloody gastroenteritis -likely gastroenteritis. C. difficile negative. Imodium when necessary. Pending GI pathogen panel.  Hypokalemia -Replace and recheck with magnesium levels in the morning.  Leukocytosis -Due to #1 above. Trending down with Rocephin.  Anxiety -Klonopin PRN  Sleep Apnea -Ambien PRN  HX of OVarian CA -In remission   Hx of renal stones -Renal US- with no hydronephrosis/ stone  Obesity BMI 34.4  Single elevated troponin in ICU on the 12th.  EKG non acute ST changes with possible SVT, we'll repeat another troponin as its clearly over 24 hours, she is chest pain-free, lace on aspirin beta blocker, check echogram for wall motion. [name] check TSH as well as there was a question of SVT in the ER.    DVT Prophylaxis SQ Lovenox Code Status: Full Family Communication: No family at bedside Disposition Plan: Inpatient   Consultants:  None  Procedures:  TTE  Antibiotics:  IV Rocephin 1/14>>>  IV Levaquin 1/12>>04/09/14  IV Vancomycin 1/12>>04/10/14   Subjective: Denies any N/V, with continued diarrhea.    Objective: Filed Vitals:   04/12/14 0621  BP: 129/54  Pulse: 76  Temp: 98.2 F (36.8 C)  Resp: 16    Intake/Output Summary (Last 24 hours) at 04/12/14 1057 Last data filed at 04/12/14 0900  Gross per 24 hour  Intake    740 ml  Output      0 ml  Net    740 ml   Filed Weights   04/09/14 0500 04/10/14 0543 04/11/14 0500  Weight: 88.9 kg (195 lb 15.8 oz) 88.451 kg (195 lb) 39.78 kg (87 lb 11.2 oz)    Exam:  Gen: Alert Caucasian female in NAD.   HEENT: Normocephalic, atraumatic.  Pupils symmertrical.  Dry mucosa.   Chest: clear to auscultate bilaterally, no ronchi or rales  Cardiac: Regular rate and rhythm, S1-S2, no rubs murmurs or gallops  Abdomen: soft, non tender, non distended, +bowel sounds. No guarding or rigidity  Extremities: Symmetrical in appearance without cyanosis or edema   Neurological: Alert awake oriented to time place and person.  Psychiatric: Appears normal.   Data Reviewed: Basic Metabolic Panel:  Recent Labs Lab 04/08/14 0434 04/09/14 0316 04/10/14 0551 04/11/14 0601 04/12/14 0358  NA 141 141 143 142 143  K 3.3* 3.0* 3.3* 3.2* 3.7  CL 105 116* 113* 116* 114*  CO2  --  19 16* 20 23  GLUCOSE 129* 87 78 92 89  BUN 14 12 11 6 7   CREATININE 1.10 1.11* 1.01 0.92 0.88  CALCIUM  --  6.5* 7.5* 8.0* 8.1*  MG  --   --  1.4*  --  1.6   Liver Function Tests:  Recent Labs Lab 04/08/14 0424  AST 41*  ALT 19  ALKPHOS 151*  BILITOT 1.1  PROT 6.8  ALBUMIN 3.5    Recent Labs Lab 04/08/14 0424  LIPASE 26   No results for input(s): AMMONIA in the last 168 hours. CBC:  Recent Labs Lab 04/08/14 0424 04/08/14 0434 04/09/14 0316 04/10/14 0551 04/11/14 0601  WBC 17.0*  --  35.5* 20.7* 13.5*  NEUTROABS 16.5*  --   --   --   --   HGB 13.9 16.0* 10.1* 11.0* 12.1  HCT 42.7 47.0* 30.3* 33.3* 36.8  MCV 96.6  --  97.1 96.2 94.8  PLT 196  --  133* 125* 139*   Cardiac Enzymes:  Recent Labs Lab 04/08/14 0731 04/11/14 1129  TROPONINI 0.04* 0.12*   BNP (last 3 results) No results for input(s): PROBNP in the last 8760 hours. CBG: No results for input(s): GLUCAP in the last 168 hours.  Recent Results (from the past 240 hour(s))  Blood culture (routine x 2)     Status: None (Preliminary result)   Collection Time: 04/08/14  4:24 AM  Result Value Ref Range Status   Specimen Description BLOOD LEFT ANTECUBITAL  Final   Special Requests BOTTLES DRAWN AEROBIC AND ANAEROBIC 5CC EA  Final   Culture   Final    GRAM NEGATIVE RODS Note: Gram Stain Report Called to,Read Back By and Verified With: SAMANTHA HARDY 04/09/14 1135 BY SMITHERSJ Performed at [facility]    Report Status PENDING  Incomplete  Urine culture     Status: None   Collection Time: 04/08/14  4:37 AM  Result Value Ref Range Status   Specimen Description URINE,  CATHETERIZED  Final   Special Requests ADDED 0630  Final   Colony Count   Final    >=100,000 COLONIES/ML Performed at [facility]    Culture   Final    ESCHERICHIA COLI Performed at [facility]    Report Status 04/10/2014 FINAL  Final   Organism ID, Bacteria ESCHERICHIA COLI  Final      Susceptibility   Escherichia coli - MIC*    AMPICILLIN 4 SENSITIVE Sensitive     CEFAZOLIN <=4 SENSITIVE Sensitive     CEFTRIAXONE <=1 SENSITIVE Sensitive     CIPROFLOXACIN >=4 RESISTANT Resistant     GENTAMICIN <=1 SENSITIVE Sensitive     LEVOFLOXACIN >=8 RESISTANT Resistant     NITROFURANTOIN <=16 SENSITIVE Sensitive     TOBRAMYCIN <=1 SENSITIVE Sensitive     TRIMETH/SULFA <=20 SENSITIVE Sensitive     PIP/TAZO <=4 SENSITIVE Sensitive     * ESCHERICHIA COLI  Blood culture (routine x 2)     Status: None (Preliminary result)   Collection Time: 04/08/14  4:47 AM  Result Value Ref Range Status   Specimen Description BLOOD LEFT HAND  Final   Special Requests BOTTLES DRAWN AEROBIC ONLY 3CC  Final   Culture   Final           BLOOD CULTURE RECEIVED NO GROWTH TO DATE CULTURE [name] BE HELD FOR 5 DAYS BEFORE ISSUING A FINAL NEGATIVE REPORT Performed at [facility]    Report Status PENDING  Incomplete  MRSA PCR Screening     Status: None   Collection Time: 04/08/14  8:59 AM  Result Value Ref Range Status   MRSA by PCR NEGATIVE NEGATIVE Final    Comment:        The GeneXpert MRSA Assay (FDA approved for NASAL specimens only), is one component of a comprehensive MRSA colonization surveillance program. It is not intended to diagnose MRSA infection nor to guide or monitor treatment for MRSA infections.   Clostridium Difficile by PCR     Status: None   Collection Time: 04/08/14  6:20 PM  Result Value Ref Range Status   C difficile by pcr NEGATIVE NEGATIVE Final  Culture, blood (routine x 2)  Status: None (Preliminary result)   Collection Time: 04/09/14  1:00 PM   Result Value Ref Range Status   Specimen Description BLOOD RIGHT ANTECUBITAL  Final   Special Requests BOTTLES DRAWN AEROBIC ONLY 10CC  Final   Culture   Final           BLOOD CULTURE RECEIVED NO GROWTH TO DATE CULTURE [name] BE HELD FOR 5 DAYS BEFORE ISSUING A FINAL NEGATIVE REPORT Performed at [facility]    Report Status PENDING  Incomplete  Culture, blood (routine x 2)     Status: None (Preliminary result)   Collection Time: 04/09/14  1:06 PM  Result Value Ref Range Status   Specimen Description BLOOD LEFT ANTECUBITAL  Final   Special Requests BOTTLES DRAWN AEROBIC ONLY 10CC  Final   Culture   Final           BLOOD CULTURE RECEIVED NO GROWTH TO DATE CULTURE [name] BE HELD FOR 5 DAYS BEFORE ISSUING A FINAL NEGATIVE REPORT Performed at [facility]    Report Status PENDING  Incomplete     Studies: No results found.  Scheduled Meds: . aspirin  81 mg Oral Daily  . cefTRIAXone (ROCEPHIN)  IV  2 g Intravenous Q24H  . enoxaparin (LOVENOX) injection  40 mg Subcutaneous Q24H  . metoprolol tartrate  25 mg Oral BID   Continuous Infusions:    Principal Problem:   Septic shock Active Problems:   Headache   Chronic insomnia   Kidney stones   Urinary tract infection   Severe sepsis with septic shock   UTI (lower urinary tract infection)   Lactic acidosis    Time spent: 12   [name] K M.D on 04/12/2014 at 10:57 AM  Between 7am to 7pm - Pager - [phone], After 7pm go to [email or web address] - password [email or web address]  And look for the night coverage person covering me after hours  [place]  [phone]   04/12/2014, 10:57 AM  LOS: 4 days             [facility] PROGRESS NOTE  [name] EQA:[id number] DOB: [birth year] DOA: 04/08/2014 PCP: Christina Post, MD  Assessment/Plan:  Summary  [name] is a 69 yo female with PMH of recurrent kidney stones, obesity, osteopenia, chronic insomnia, osteoarthritis,  and hx of ovarian cancer that rpesented to the ED 1/12 with nausea, vomiting, and back pain. Patient attributed her pain to passing of renal stone.  In ED, she was found to be in septic shock and was found to have a UTI. CXR and renal US with no acute abnormalities.  She was then admitted to the ICU, and had central line placed. She was placed on aggressive fluid resuscitation, IV antibiotics, and phenylephrine infusion, with much improvement in status.  Patient was then transferred to floor on 04/10/13.      Septic shock due to UTI - Septic shock resolved, initial treatment in ICU under the care of pulmonary critical care she required close to 6 L normal saline. Hemodynamically stable and nontoxic appearing, blood cultures negative, urine cultures noted and she has been switched to Rocephin on 04/10/2014. Continue with supportive care and monitor closely   Nonbloody gastroenteritis - likely gastroenteritis. C. difficile negative. Imodium when necessary. Pending GI pathogen panel.  Hypokalemia -Replace   Leukocytosis -Due to #1 above. Trending down with Rocephin.  Anxiety -Klonopin PRN  Sleep Apnea -Ambien PRN  HX of Ovarian CA -In remission   Hx of renal  stones -Renal US- with no hydronephrosis/ stone  Obesity BMI 34.4  Elevated Trop on the 12th.  EKG non acute ST changes with possible SVT, trend troponin, she is chest pain-free, lace on aspirin beta blocker, check echogram for wall motion. Stable TSH..    DVT Prophylaxis SQ Lovenox Code Status: Full Family Communication: No family at bedside Disposition Plan: Inpatient   Consultants:  None  Procedures:  TTE  Antibiotics:  IV Rocephin 1/14>>>  IV Levaquin 1/12>>04/09/14  IV Vancomycin 1/12>>04/10/14   Subjective: Denies any N/V, with continued diarrhea.    Objective: Filed Vitals:   04/12/14 0621  BP: 129/54  Pulse: 76  Temp: 98.2 F (36.8 C)  Resp: 16    Intake/Output Summary (Last 24 hours) at  04/12/14 1057 Last data filed at 04/12/14 0900  Gross per 24 hour  Intake    740 ml  Output      0 ml  Net    740 ml   Filed Weights   04/09/14 0500 04/10/14 0543 04/11/14 0500  Weight: 88.9 kg (195 lb 15.8 oz) 88.451 kg (195 lb) 39.78 kg (87 lb 11.2 oz)    Exam:  Gen: Alert Caucasian female in NAD.   HEENT: Normocephalic, atraumatic.  Pupils symmertrical.  Dry mucosa.   Chest: clear to auscultate bilaterally, no ronchi or rales  Cardiac: Regular rate and rhythm, S1-S2, no rubs murmurs or gallops  Abdomen: soft, non tender, non distended, +bowel sounds. No guarding or rigidity  Extremities: Symmetrical in appearance without cyanosis or edema  Neurological: Alert awake oriented to time place and person.  Psychiatric: Appears normal.   Data Reviewed: Basic Metabolic Panel:  Recent Labs Lab 04/08/14 0434 04/09/14 0316 04/10/14 0551 04/11/14 0601 04/12/14 0358  NA 141 141 143 142 143  K 3.3* 3.0* 3.3* 3.2* 3.7  CL 105 116* 113* 116* 114*  CO2  --  19 16* 20 23  GLUCOSE 129* 87 78 92 89  BUN 14 12 11 6 7   CREATININE 1.10 1.11* 1.01 0.92 0.88  CALCIUM  --  6.5* 7.5* 8.0* 8.1*  MG  --   --  1.4*  --  1.6   Liver Function Tests:  Recent Labs Lab 04/08/14 0424  AST 41*  ALT 19  ALKPHOS 151*  BILITOT 1.1  PROT 6.8  ALBUMIN 3.5    Recent Labs Lab 04/08/14 0424  LIPASE 26   Lab Results  Component Value Date   TSH 2.873 04/11/2014    No results for input(s): AMMONIA in the last 168 hours. CBC:  Recent Labs Lab 04/08/14 0424 04/08/14 0434 04/09/14 0316 04/10/14 0551 04/11/14 0601  WBC 17.0*  --  35.5* 20.7* 13.5*  NEUTROABS 16.5*  --   --   --   --   HGB 13.9 16.0* 10.1* 11.0* 12.1  HCT 42.7 47.0* 30.3* 33.3* 36.8  MCV 96.6  --  97.1 96.2 94.8  PLT 196  --  133* 125* 139*   Cardiac Enzymes:  Recent Labs Lab 04/08/14 0731 04/11/14 1129  TROPONINI 0.04* 0.12*   BNP (last 3 results) No results for input(s): PROBNP in the last 8760  hours. CBG: No results for input(s): GLUCAP in the last 168 hours.  Recent Results (from the past 240 hour(s))  Blood culture (routine x 2)     Status: None (Preliminary result)   Collection Time: 04/08/14  4:24 AM  Result Value Ref Range Status   Specimen Description BLOOD LEFT ANTECUBITAL  Final   Special  Requests BOTTLES DRAWN AEROBIC AND ANAEROBIC 5CC EA  Final   Culture   Final    GRAM NEGATIVE RODS Note: Gram Stain Report Called to,Read Back By and Verified With: SAMANTHA HARDY 04/09/14 1135 BY SMITHERSJ Performed at [facility]    Report Status PENDING  Incomplete  Urine culture     Status: None   Collection Time: 04/08/14  4:37 AM  Result Value Ref Range Status   Specimen Description URINE, CATHETERIZED  Final   Special Requests ADDED 0630  Final   Colony Count   Final    >=100,000 COLONIES/ML Performed at [facility]    Culture   Final    ESCHERICHIA COLI Performed at [facility]    Report Status 04/10/2014 FINAL  Final   Organism ID, Bacteria ESCHERICHIA COLI  Final      Susceptibility   Escherichia coli - MIC*    AMPICILLIN 4 SENSITIVE Sensitive     CEFAZOLIN <=4 SENSITIVE Sensitive     CEFTRIAXONE <=1 SENSITIVE Sensitive     CIPROFLOXACIN >=4 RESISTANT Resistant     GENTAMICIN <=1 SENSITIVE Sensitive     LEVOFLOXACIN >=8 RESISTANT Resistant     NITROFURANTOIN <=16 SENSITIVE Sensitive     TOBRAMYCIN <=1 SENSITIVE Sensitive     TRIMETH/SULFA <=20 SENSITIVE Sensitive     PIP/TAZO <=4 SENSITIVE Sensitive     * ESCHERICHIA COLI  Blood culture (routine x 2)     Status: None (Preliminary result)   Collection Time: 04/08/14  4:47 AM  Result Value Ref Range Status   Specimen Description BLOOD LEFT HAND  Final   Special Requests BOTTLES DRAWN AEROBIC ONLY 3CC  Final   Culture   Final           BLOOD CULTURE RECEIVED NO GROWTH TO DATE CULTURE [name] BE HELD FOR 5 DAYS BEFORE ISSUING A FINAL NEGATIVE REPORT Performed at [facility]     Report Status PENDING  Incomplete  MRSA PCR Screening     Status: None   Collection Time: 04/08/14  8:59 AM  Result Value Ref Range Status   MRSA by PCR NEGATIVE NEGATIVE Final    Comment:        The GeneXpert MRSA Assay (FDA approved for NASAL specimens only), is one component of a comprehensive MRSA colonization surveillance program. It is not intended to diagnose MRSA infection nor to guide or monitor treatment for MRSA infections.   Clostridium Difficile by PCR     Status: None   Collection Time: 04/08/14  6:20 PM  Result Value Ref Range Status   C difficile by pcr NEGATIVE NEGATIVE Final  Culture, blood (routine x 2)     Status: None (Preliminary result)   Collection Time: 04/09/14  1:00 PM  Result Value Ref Range Status   Specimen Description BLOOD RIGHT ANTECUBITAL  Final   Special Requests BOTTLES DRAWN AEROBIC ONLY 10CC  Final   Culture   Final           BLOOD CULTURE RECEIVED NO GROWTH TO DATE CULTURE [name] BE HELD FOR 5 DAYS BEFORE ISSUING A FINAL NEGATIVE REPORT Performed at [facility]    Report Status PENDING  Incomplete  Culture, blood (routine x 2)     Status: None (Preliminary result)   Collection Time: 04/09/14  1:06 PM  Result Value Ref Range Status   Specimen Description BLOOD LEFT ANTECUBITAL  Final   Special Requests BOTTLES DRAWN AEROBIC ONLY 10CC  Final  Culture   Final           BLOOD CULTURE RECEIVED NO GROWTH TO DATE CULTURE [name] BE HELD FOR 5 DAYS BEFORE ISSUING A FINAL NEGATIVE REPORT Performed at [facility]    Report Status PENDING  Incomplete     Studies: No results found.  Scheduled Meds: . aspirin  81 mg Oral Daily  . cefTRIAXone (ROCEPHIN)  IV  2 g Intravenous Q24H  . enoxaparin (LOVENOX) injection  40 mg Subcutaneous Q24H  . metoprolol tartrate  25 mg Oral BID   Continuous Infusions:    Principal Problem:   Septic shock Active Problems:   Headache   Chronic insomnia   Kidney stones   Urinary tract  infection   Severe sepsis with septic shock   UTI (lower urinary tract infection)   Lactic acidosis    Time spent: 25   [name] K M.D on 04/12/2014 at 10:57 AM  Between 7am to 7pm - Pager - [phone], After 7pm go to [email or web address] - password [email or web address]  And look for the night coverage person covering me after hours  [place]  [phone]   04/12/2014, 10:57 AM  LOS: 4 days

## 2014-04-12 NOTE — Progress Notes (Signed)
Echocardiogram 2D Echocardiogram has been performed.  [name] M 04/12/2014, 12:09 PM

## 2014-04-13 MED ORDER — ASPIRIN 81 MG PO CHEW: 81.0000 mg | CHEWABLE_TABLET | Freq: Every day | ORAL | Status: DC

## 2014-04-13 MED ORDER — METOPROLOL TARTRATE 25 MG PO TABS: 25.0000 mg | ORAL_TABLET | Freq: Two times a day (BID) | ORAL | Status: DC

## 2014-04-13 MED ORDER — CEFUROXIME AXETIL 500 MG PO TABS: 500.0000 mg | ORAL_TABLET | Freq: Two times a day (BID) | ORAL | Status: DC

## 2014-04-13 NOTE — Progress Notes (Signed)
Pt discharged to home accomp by husband.  Pt given Rx for Ceftin and baby ASA and explained.   Pt understands the importance of FU with PCP, Infectious Disease Dr. And for FU lab work.  Copy of discharge instructions given to pt.

## 2014-04-13 NOTE — Discharge Instructions (Signed)
Follow with Primary MD [name], MD in 7 days   Get CBC, CMP, 2 view Chest X ray checked  by Primary MD next visit.    Activity: As tolerated with Full fall precautions use walker/cane & assistance as needed   Disposition Home     Diet: Heart Healthy    For Heart failure patients - Check your Weight same time everyday, if you gain over 2 pounds, or you develop in leg swelling, experience more shortness of breath or chest pain, call your Primary MD immediately. Follow Cardiac Low Salt Diet and 1.8 lit/day fluid restriction.   On your next visit with your primary care physician please Get Medicines reviewed and adjusted.   Please request your Prim.MD to go over all Hospital Tests and Procedure/Radiological results at the follow up, please get all Hospital records sent to your Prim MD by signing hospital release before you go home.   If you experience worsening of your admission symptoms, develop shortness of breath, life threatening emergency, suicidal or homicidal thoughts you must seek medical attention immediately by calling 911 or calling your MD immediately  if symptoms less severe.  You Must read complete instructions/literature along with all the possible adverse reactions/side effects for all the Medicines you take and that have been prescribed to you. Take any new Medicines after you have completely understood and accpet all the possible adverse reactions/side effects.   Do not drive, operating heavy machinery, perform activities at heights, swimming or participation in water activities or provide baby sitting services if your were admitted for syncope or siezures until you have seen by Primary MD or a Neurologist and advised to do so again.  Do not drive when taking Pain medications.    Do not take more than prescribed Pain, Sleep and Anxiety Medications  Special Instructions: If you have smoked or chewed Tobacco  in the last 2 yrs please stop smoking, stop any  regular Alcohol  and or any Recreational drug use.  Wear Seat belts while driving.   Please note  You were cared for by a hospitalist during your hospital stay. If you have any questions about your discharge medications or the care you received while you were in the hospital after you are discharged, you can call the unit and asked to speak with the hospitalist on call if the hospitalist that took care of you is not available. Once you are discharged, your primary care physician will handle any further medical issues. Please note that NO REFILLS for any discharge medications will be authorized once you are discharged, as it is imperative that you return to your primary care physician (or establish a relationship with a primary care physician if you do not have one) for your aftercare needs so that they can reassess your need for medications and monitor your lab values.

## 2014-04-13 NOTE — Discharge Summary (Addendum)
[name], is a 69 y.o. female  DOB Feb 25, 1946  MRN [id number].  Admission date:  04/08/2014  Admitting Physician  Brand Males, MD  Discharge Date:  04/13/2014   Primary MD  Christina Post, MD  Recommendations for primary care physician for things to follow:   Monitor right hand scratch, repeat CBC BMP in a week. Must follow with cardiology and ID 1 time within 7-10 days.   Admission Diagnosis  Diarrhea [R19.7] Dehydration [E86.0] Renal stone [N20.0] Pyelonephritis [N12] Tachycardia [R00.0] Sepsis associated hypotension [A41.9]   Discharge Diagnosis  Diarrhea [R19.7] Dehydration [E86.0] Renal stone [N20.0] Pyelonephritis [N12] Tachycardia [R00.0] Sepsis associated hypotension [A41.9]     Principal Problem:   Septic shock Active Problems:   Headache   Chronic insomnia   Kidney stones   Urinary tract infection   Severe sepsis with septic shock   UTI (lower urinary tract infection)   Lactic acidosis      Past Medical History  Diagnosis Date  . VIRAL URI 03/06/2009  . Cervicalgia 05/19/2009  . WEIGHT GAIN 06/30/2008  . Headache(784.0) 05/19/2009  . SPRAIN AND STRAIN OF UNSPECIFIED SITE OF HAND 05/19/2009  . NEOPLASM, MALIGNANT, OVARY, HX OF 06/30/2008    Past Surgical History  Procedure Laterality Date  . Appendectomy    . Abdominal hysterectomy    . Ovarian cancer  2007  . Cesarean section    . Splenectomy  2007  . Gastric bypass  1979  . Transurethral resection of bladder  2007    LESION  . Renal calculi    . Cholecystectomy  2011       History of present illness and  Hospital Course:     Kindly see H&P for history of present illness and admission details, please review complete Labs, Consult reports and Test reports for all details in brief  HPI  from the history and physical done on  the day of admission  69 year old woman with past medical history of obesity, osteopenia, history of recurrent kidney stones, chronic insomnia, osteoarthritis and history of ovarian cancer in remission for several years. She presents with nausea, vomiting and back pain. She is suspected to have pyelonephritis with septic shock and gastroenteritis.  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 Hemodynamically stable and nontoxic appearing, blood cultures negative, urine cultures noted and she has been switched to Rocephin on 04/10/14. She is clinically completely defervesced, nontoxic appearing, discussed with ID physician Dr. Drucilla Schmidt, will be placed on 10 more days of oral Ceftin with outpatient ID follow-up. Repeat blood cultures were negative.   Nonbloody gastroenteritis resolved C. difficile negative.   Hypokalemia -Replaced stable.    Leukocytosis -Due to #1 above. Trending down wiantibiotic treatment.    Anxiety -Klonopin PRN   Sleep Apnea -Ambien PRN   HX of OVarian CA -In  remission    Hx of renal stones -Renal US- with no hydronephrosis/ stone   Obesity BMI 34.4   Single elevated troponin in ICU on the 12th.   she was always symptom-free, troponin elevation secondary to sepsis, EKG non acute ST changes with possible SVT, opponent trend non-ACS pattern, echogram stable with EF 60% and no wall motion abnormality, discussed with cardiologist Dr. Tana Conch, appropriate for aspirin ,  BP low no beta blocker, follow up with him in the office in a week    Discharge Condition: Stable   Follow UP  Follow-up Information    Follow up with Christina Post, MD. Schedule an appointment as soon as possible for a visit in 1 week.   Specialty:  Family Medicine   Contact information:    [place] [address] [phone]       Follow up with Candee Furbish, MD.   Specialty:  Cardiology   Contact information:   [address] [place] [address] [phone]       Follow up with Alcide Evener, MD. Schedule an appointment as soon as possible for a visit in 3 days.   Specialty:  Infectious Diseases   Contact information:   [address] [place] [address] [phone]         Discharge Instructions  and  Discharge Medications          Discharge Instructions    Diet - low sodium heart healthy    Complete by:  As directed      Discharge instructions    Complete by:  As directed   Follow with Primary MD Christina Post, MD in 7 days   Get CBC, CMP, 2 view Chest X ray checked  by Primary MD next visit.    Activity: As tolerated with Full fall precautions use walker/cane & assistance as needed   Disposition Home     Diet: Heart Healthy    For Heart failure patients - Check your Weight same time everyday, if you gain over 2 pounds, or you develop in leg swelling, experience more shortness of breath or chest pain, call your Primary MD immediately. Follow Cardiac Low Salt Diet and 1.8 lit/day fluid restriction.   On your next visit with your primary care physician please Get Medicines reviewed and adjusted.   Please request your Prim.MD to go over all Hospital Tests and Procedure/Radiological results at the follow up, please get all Hospital records sent to your Prim MD by signing hospital release before you go home.   If you experience worsening of your admission symptoms, develop shortness of breath, life threatening emergency, suicidal or homicidal thoughts you must seek medical attention immediately by calling 911 or calling your MD immediately  if symptoms less severe.  You Must read complete instructions/literature along with all the possible adverse reactions/side effects  for all the Medicines you take and that have been prescribed to you. Take any new Medicines after you have completely understood and accpet all the possible adverse reactions/side effects.   Do not drive, operating heavy machinery, perform activities at heights, swimming or participation in water activities or provide baby sitting services if your were admitted for syncope or siezures until you have seen by Primary MD or a Neurologist and advised to do so again.  Do not drive when taking Pain medications.    Do not take more than prescribed Pain, Sleep and Anxiety Medications  Special  Instructions: If you have smoked or chewed Tobacco  in the last 2 yrs please stop smoking, stop any regular Alcohol  and or any Recreational drug use.  Wear Seat belts while driving.   Please note  You were cared for by a hospitalist during your hospital stay. If you have any questions about your discharge medications or the care you received while you were in the hospital after you are discharged, you can call the unit and asked to speak with the hospitalist on call if the hospitalist that took care of you is not available. Once you are discharged, your primary care physician will handle any further medical issues. Please note that NO REFILLS for any discharge medications will be authorized once you are discharged, as it is imperative that you return to your primary care physician (or establish a relationship with a primary care physician if you do not have one) for your aftercare needs so that they can reassess your need for medications and monitor your lab values.     Increase activity slowly    Complete by:  As directed             Medication List    STOP taking these medications        ibuprofen 200 MG tablet  Commonly known as:  ADVIL,MOTRIN      TAKE these medications        aspirin 81 MG chewable tablet  Chew 1 tablet (81 mg total) by mouth daily.     calcium carbonate 1250 MG tablet    Commonly known as:  OS-CAL - dosed in mg of elemental calcium  Take 2 tablets by mouth daily.     cefUROXime 500 MG tablet  Commonly known as:  CEFTIN  Take 1 tablet (500 mg total) by mouth 2 (two) times daily with a meal.     multivitamin tablet  Take 1 tablet by mouth daily.     promethazine 25 MG tablet  Commonly known as:  PHENERGAN  Take 25 mg by mouth every 6 (six) hours as needed for nausea or vomiting.     zolpidem 10 MG tablet  Commonly known as:  AMBIEN  TAKE 1 TABLET BY MOUTH AT BEDTIME **15 PER 30 INSURANCE          Diet and Activity recommendation: See Discharge Instructions above   Consults obtained - PCCM, ID [facility] and cardiology Dr. Marlou Porch over the phone   Major procedures and Radiology Reports - PLEASE review detailed and final reports for all details, in brief -    Echogram.   - Left ventricle: The cavity size was normal. Systolic function wasnormal. The estimated ejection fraction was in the range of 55%to 60%. Wall motion was normal; there were no regional wallmotion abnormalities. - Mitral valve: There was mild regurgitation. - Left atrium: The atrium was mildly dilated.   US Renal  04/08/2014   CLINICAL DATA:  Renal stone  EXAM: RENAL/URINARY TRACT ULTRASOUND COMPLETE  COMPARISON:  CT 12/12/2012  FINDINGS: Right Kidney:  Length: 10.4 cm. Echogenicity within normal limits. No mass or hydronephrosis visualized.  Left Kidney:  Length: 10.7 cm. Echogenicity within normal limits. No mass or hydronephrosis visualized.  Bladder:  Appears normal for degree of bladder distention.  IMPRESSION: No acute findings.  No hydronephrosis.  No visible stones.   Electronically Signed   By: Rolm Baptise M.D.   On: 04/08/2014 10:27   Dg Chest Port 1 View  04/08/2014   CLINICAL  DATA:  Central line placement.  EXAM: PORTABLE CHEST - 1 VIEW  COMPARISON:  04/08/2014  FINDINGS: Left central line tip is in the SVC. No pneumothorax. Heart is normal size. Mild interstitial  prominence throughout the lungs could reflect mild interstitial edema. No confluent opacities or effusions. No acute bony abnormality.  IMPRESSION: Left central line tip is in the SVC.  No pneumothorax.  New interstitial prominence diffusely throughout the lungs, question edema. Heart size is normal.   Electronically Signed   By: Rolm Baptise M.D.   On: 04/08/2014 11:43   Dg Chest Portable 1 View  04/08/2014   CLINICAL DATA:  Acute onset of generalized chest pain and shortness of breath. Initial encounter.  EXAM: PORTABLE CHEST - 1 VIEW  COMPARISON:  Chest radiograph performed 01/14/2014  FINDINGS: The lungs are well-aerated and clear. There is no evidence of focal opacification, pleural effusion or pneumothorax.  The cardiomediastinal silhouette is within normal limits. No acute osseous abnormalities are seen.  IMPRESSION: No acute cardiopulmonary process seen.   Electronically Signed   By: Garald Balding M.D.   On: 04/08/2014 04:25   Mm Screening Breast Tomo Bilateral  03/18/2014   CLINICAL DATA:  Screening.  EXAM: DIGITAL SCREENING BILATERAL MAMMOGRAM WITH 3D TOMO WITH CAD  COMPARISON:  Previous exam(s).  ACR Breast Density Category b: There are scattered areas of fibroglandular density.  FINDINGS: There are no findings suspicious for malignancy. Images were processed with CAD.  IMPRESSION: No mammographic evidence of malignancy. A result letter of this screening mammogram will be mailed directly to the patient.  RECOMMENDATION: Screening mammogram in one year. (Code:[id number])  BI-RADS CATEGORY  1: Negative.   Electronically Signed   By: Lajean Manes M.D.   On: 03/18/2014 13:21    Micro Results      Recent Results (from the past 240 hour(s))  Blood culture (routine x 2)     Status: None   Collection Time: 04/08/14  4:24 AM  Result Value Ref Range Status   Specimen Description BLOOD LEFT ANTECUBITAL  Final   Special Requests BOTTLES DRAWN AEROBIC AND ANAEROBIC 5CC EA  Final   Culture   Final      CAPNOCYTOPHAGA SPECIES Note: BETA LACTAMASE NEGATIVE Note: Gram Stain Report Called to,Read Back By and Verified With: SAMANTHA HARDY 04/09/14 1135 BY SMITHERSJ Performed at [facility]    Report Status 04/12/2014 FINAL  Final  Urine culture     Status: None   Collection Time: 04/08/14  4:37 AM  Result Value Ref Range Status   Specimen Description URINE, CATHETERIZED  Final   Special Requests ADDED 0630  Final   Colony Count   Final    >=100,000 COLONIES/ML Performed at [facility]    Culture   Final    ESCHERICHIA COLI Performed at [facility]    Report Status 04/10/2014 FINAL  Final   Organism ID, Bacteria ESCHERICHIA COLI  Final      Susceptibility   Escherichia coli - MIC*    AMPICILLIN 4 SENSITIVE Sensitive     CEFAZOLIN <=4 SENSITIVE Sensitive     CEFTRIAXONE <=1 SENSITIVE Sensitive     CIPROFLOXACIN >=4 RESISTANT Resistant     GENTAMICIN <=1 SENSITIVE Sensitive     LEVOFLOXACIN >=8 RESISTANT Resistant     NITROFURANTOIN <=16 SENSITIVE Sensitive     TOBRAMYCIN <=1 SENSITIVE Sensitive     TRIMETH/SULFA <=20 SENSITIVE Sensitive     PIP/TAZO <=4 SENSITIVE Sensitive     *  ESCHERICHIA COLI  Blood culture (routine x 2)     Status: None (Preliminary result)   Collection Time: 04/08/14  4:47 AM  Result Value Ref Range Status   Specimen Description BLOOD LEFT HAND  Final   Special Requests BOTTLES DRAWN AEROBIC ONLY 3CC  Final   Culture   Final           BLOOD CULTURE RECEIVED NO GROWTH TO DATE CULTURE WILL BE HELD FOR 5 DAYS BEFORE ISSUING A FINAL NEGATIVE REPORT Performed at [facility]    Report Status PENDING  Incomplete  MRSA PCR Screening     Status: None   Collection Time: 04/08/14  8:59 AM  Result Value Ref Range Status   MRSA by PCR NEGATIVE NEGATIVE Final    Comment:        The GeneXpert MRSA Assay (FDA approved for NASAL specimens only), is one component of a comprehensive MRSA colonization surveillance program. It is  not intended to diagnose MRSA infection nor to guide or monitor treatment for MRSA infections.   Clostridium Difficile by PCR     Status: None   Collection Time: 04/08/14  6:20 PM  Result Value Ref Range Status   C difficile by pcr NEGATIVE NEGATIVE Final  Culture, blood (routine x 2)     Status: None (Preliminary result)   Collection Time: 04/09/14  1:00 PM  Result Value Ref Range Status   Specimen Description BLOOD RIGHT ANTECUBITAL  Final   Special Requests BOTTLES DRAWN AEROBIC ONLY 10CC  Final   Culture   Final           BLOOD CULTURE RECEIVED NO GROWTH TO DATE CULTURE WILL BE HELD FOR 5 DAYS BEFORE ISSUING A FINAL NEGATIVE REPORT Performed at [facility]    Report Status PENDING  Incomplete  Culture, blood (routine x 2)     Status: None (Preliminary result)   Collection Time: 04/09/14  1:06 PM  Result Value Ref Range Status   Specimen Description BLOOD LEFT ANTECUBITAL  Final   Special Requests BOTTLES DRAWN AEROBIC ONLY 10CC  Final   Culture   Final           BLOOD CULTURE RECEIVED NO GROWTH TO DATE CULTURE WILL BE HELD FOR 5 DAYS BEFORE ISSUING A FINAL NEGATIVE REPORT Performed at [facility]    Report Status PENDING  Incomplete       Today   Subjective:   Lucresha Dismuke today has no headache,no chest abdominal pain,no new weakness tingling or numbness, feels much better wants to go home today.    Objective:   Blood pressure 106/48, pulse 78, temperature 98.2 F (36.8 C), temperature source Oral, resp. rate 16, height 5\' 2"  (1.575 m), weight 83.099 kg (183 lb 3.2 oz), SpO2 97 %.   Intake/Output Summary (Last 24 hours) at 04/13/14 1118 Last data filed at 04/13/14 0854  Gross per 24 hour  Intake    700 ml  Output      0 ml  Net    700 ml    Exam Awake Alert, Oriented x 3, No new F.N deficits, Normal affect [place].AT,PERRAL Supple Neck,No JVD, No cervical lymphadenopathy appriciated.  Symmetrical Chest wall movement, Good air movement  bilaterally, CTAB RRR,No Gallops,Rubs or new Murmurs, No Parasternal Heave +ve B.Sounds, Abd Soft, Non tender, No organomegaly appriciated, No rebound -guarding or rigidity. No Cyanosis, Clubbing or edema, No new Rash or bruise, SMALL SCRATCH ON R. HAND  Data Review  CBC w Diff:  Lab Results  Component Value Date   WBC 13.5* 04/11/2014   WBC 10.3 07/05/2011   HGB 12.1 04/11/2014   HGB 13.3 07/05/2011   HCT 36.8 04/11/2014   HCT 39.7 07/05/2011   PLT 139* 04/11/2014   PLT 311 07/05/2011   LYMPHOPCT 2* 04/08/2014   LYMPHOPCT 20.7 07/05/2011   MONOPCT 1* 04/08/2014   MONOPCT 9.3 07/05/2011   EOSPCT 0 04/08/2014   EOSPCT 1.8 07/05/2011   BASOPCT 0 04/08/2014   BASOPCT 0.6 07/05/2011    CMP:  Lab Results  Component Value Date   NA 143 04/12/2014   K 3.7 04/12/2014   CL 114* 04/12/2014   CO2 23 04/12/2014   BUN 7 04/12/2014   CREATININE 0.88 04/12/2014   PROT 6.8 04/08/2014   ALBUMIN 3.5 04/08/2014   BILITOT 1.1 04/08/2014   ALKPHOS 151* 04/08/2014   AST 41* 04/08/2014   ALT 19 04/08/2014  .   Total Time in preparing paper work, data evaluation and todays exam - 35 minutes  Thurnell Lose M.D on 04/13/2014 at [place]  [phone]

## 2014-04-14 LAB — CULTURE, BLOOD (ROUTINE X 2): Culture: NO GROWTH

## 2014-04-15 LAB — GI PATHOGEN PANEL BY PCR, STOOL
C difficile toxin A/B: NEGATIVE
Campylobacter by PCR: NEGATIVE
Cryptosporidium by PCR: NEGATIVE
E coli (ETEC) LT/ST: NEGATIVE
E coli (STEC): NEGATIVE
E coli 0157 by PCR: NEGATIVE
G lamblia by PCR: NEGATIVE
Norovirus GI/GII: NEGATIVE
Rotavirus A by PCR: NEGATIVE
Salmonella by PCR: NEGATIVE
Shigella by PCR: NEGATIVE

## 2014-04-15 LAB — CULTURE, BLOOD (ROUTINE X 2)
Culture: NO GROWTH
Culture: NO GROWTH

## 2014-04-17 ENCOUNTER — Encounter: Payer: Self-pay | Admitting: Infectious Disease

## 2014-04-17 ENCOUNTER — Ambulatory Visit (INDEPENDENT_AMBULATORY_CARE_PROVIDER_SITE_OTHER): Payer: Medicare Other | Admitting: Infectious Disease

## 2014-04-17 VITALS — BP 116/77 | HR 79 | Temp 98.0°F | Ht 62.0 in | Wt 177.0 lb

## 2014-04-17 DIAGNOSIS — Z23 Encounter for immunization: Secondary | ICD-10-CM

## 2014-04-17 DIAGNOSIS — R6521 Severe sepsis with septic shock: Secondary | ICD-10-CM

## 2014-04-17 DIAGNOSIS — W540XXA Bitten by dog, initial encounter: Secondary | ICD-10-CM | POA: Diagnosis not present

## 2014-04-17 DIAGNOSIS — Q8901 Asplenia (congenital): Secondary | ICD-10-CM | POA: Diagnosis not present

## 2014-04-17 DIAGNOSIS — T148 Other injury of unspecified body region: Secondary | ICD-10-CM

## 2014-04-17 DIAGNOSIS — Z862 Personal history of diseases of the blood and blood-forming organs and certain disorders involving the immune mechanism: Secondary | ICD-10-CM

## 2014-04-17 DIAGNOSIS — A419 Sepsis, unspecified organism: Secondary | ICD-10-CM | POA: Diagnosis not present

## 2014-04-17 DIAGNOSIS — Z87898 Personal history of other specified conditions: Secondary | ICD-10-CM

## 2014-04-17 NOTE — Progress Notes (Signed)
   Subjective:    Patient ID: [name], female    DOB: [birth year], 69 y.o.   MRN: [id number]  HPI  69 year old with history of ovarian cancer sp surgery that was complicated by need for splenectomy. She was admitted to the hospital with septic shock from Capnocytophaga blood stream infection after having sustained a scrape from dog to her finger.   She was admitted to the ICU in septic shock and treated with vancomycin and levaquin. Her urine cultures were positive for a pan sensitive E coli but 1/2 admission blood cultures were positive for Capnocytophaga and I am confident that this pathogen was the culprit for her dramatic septic shock.  She was dc on oral ceftin. She is doing well and without acute complaints.    Review of Systems  Constitutional: Negative for fever, chills, diaphoresis, activity change, appetite change, fatigue and unexpected weight change.  HENT: Negative for congestion, rhinorrhea, sinus pressure, sneezing, sore throat and trouble swallowing.   Eyes: Negative for photophobia and visual disturbance.  Respiratory: Negative for cough, chest tightness, shortness of breath, wheezing and stridor.   Cardiovascular: Negative for chest pain, palpitations and leg swelling.  Gastrointestinal: Negative for nausea, vomiting, abdominal pain, diarrhea, constipation, blood in stool, abdominal distention and anal bleeding.  Genitourinary: Negative for dysuria, hematuria, flank pain and difficulty urinating.  Musculoskeletal: Negative for myalgias, back pain, joint swelling, arthralgias and gait problem.  Skin: Positive for wound. Negative for color change, pallor and rash.  Neurological: Negative for dizziness, tremors, weakness and light-headedness.  Hematological: Negative for adenopathy. Does not bruise/bleed easily.  Psychiatric/Behavioral: Negative for behavioral problems, confusion, sleep disturbance, dysphoric mood, decreased concentration and agitation.         Objective:   Physical Exam  Constitutional: She is oriented to person, place, and time. She appears well-developed and well-nourished. No distress.  HENT:  Head: Normocephalic and atraumatic.  Mouth/Throat: No oropharyngeal exudate.  Eyes: Conjunctivae and EOM are normal. No scleral icterus.  Neck: Normal range of motion. Neck supple.  Cardiovascular: Normal rate and regular rhythm.   Pulmonary/Chest: Effort normal. No respiratory distress. She has no wheezes.  Abdominal: She exhibits no distension.  Musculoskeletal: She exhibits no edema or tenderness.  Neurological: She is alert and oriented to person, place, and time. She exhibits normal muscle tone. Coordination normal.  Skin: Skin is warm and dry. No rash noted. She is not diaphoretic. No erythema. No pallor.  Psychiatric: She has a normal mood and affect. Her behavior is normal. Judgment and thought content normal.   Area where dog's teeth had scratched finger. Seems to be healing.          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   We will bring her back in 3 weeks time and check surveillance cultures  I spent greater than 40 minutes with the patient including greater than 50% of time in face to face counsel of the patient and in coordination of their care.   S/p splenectomy: we will give menactra today

## 2014-04-21 LAB — CARBOXYHEMOGLOBIN
Methemoglobin: 0.8 % (ref 0.0–1.5)
O2 Saturation: 67.9 %
Total hemoglobin: 10.1 g/dL — ABNORMAL LOW (ref 12.0–16.0)

## 2014-04-23 ENCOUNTER — Encounter: Payer: Self-pay | Admitting: Family Medicine

## 2014-04-23 ENCOUNTER — Ambulatory Visit (INDEPENDENT_AMBULATORY_CARE_PROVIDER_SITE_OTHER): Payer: Medicare Other | Admitting: Family Medicine

## 2014-04-23 VITALS — BP 118/70 | HR 80 | Temp 97.8°F | Wt 176.0 lb

## 2014-04-23 DIAGNOSIS — R6520 Severe sepsis without septic shock: Secondary | ICD-10-CM | POA: Diagnosis not present

## 2014-04-23 DIAGNOSIS — A419 Sepsis, unspecified organism: Secondary | ICD-10-CM | POA: Diagnosis not present

## 2014-04-23 LAB — CBC WITH DIFFERENTIAL/PLATELET
Basophils Absolute: 0.1 10*3/uL (ref 0.0–0.1)
Basophils Relative: 1.1 % (ref 0.0–3.0)
Eosinophils Absolute: 0.2 10*3/uL (ref 0.0–0.7)
Eosinophils Relative: 2.5 % (ref 0.0–5.0)
HCT: 39.2 % (ref 36.0–46.0)
Hemoglobin: 12.9 g/dL (ref 12.0–15.0)
Lymphocytes Relative: 26.3 % (ref 12.0–46.0)
Lymphs Abs: 2.3 10*3/uL (ref 0.7–4.0)
MCHC: 32.8 g/dL (ref 30.0–36.0)
MCV: 96.2 fl (ref 78.0–100.0)
Monocytes Absolute: 0.9 10*3/uL (ref 0.1–1.0)
Monocytes Relative: 10.1 % (ref 3.0–12.0)
Neutro Abs: 5.3 10*3/uL (ref 1.4–7.7)
Neutrophils Relative %: 60 % (ref 43.0–77.0)
Platelets: 540 10*3/uL — ABNORMAL HIGH (ref 150.0–400.0)
RBC: 4.07 Mil/uL (ref 3.87–5.11)
RDW: 14.9 % (ref 11.5–15.5)
WBC: 8.8 10*3/uL (ref 4.0–10.5)

## 2014-04-23 LAB — BASIC METABOLIC PANEL
BUN: 15 mg/dL (ref 6–23)
CO2: 24 mEq/L (ref 19–32)
Calcium: 9.5 mg/dL (ref 8.4–10.5)
Chloride: 106 mEq/L (ref 96–112)
Creatinine, Ser: 1.01 mg/dL (ref 0.40–1.20)
GFR: 58.14 mL/min — ABNORMAL LOW (ref >60.00)
Glucose, Bld: 99 mg/dL (ref 70–99)
Potassium: 3.9 mEq/L (ref 3.5–5.1)
Sodium: 138 mEq/L (ref 135–145)

## 2014-04-23 LAB — POCT URINALYSIS DIPSTICK
Bilirubin, UA: NEGATIVE
Blood, UA: NEGATIVE
Glucose, UA: NEGATIVE
Ketones, UA: NEGATIVE
Leukocytes, UA: NEGATIVE
Nitrite, UA: NEGATIVE
Spec Grav, UA: 1.025
Urobilinogen, UA: 0.2
pH, UA: 5.5

## 2014-04-23 LAB — HEPATIC FUNCTION PANEL
ALT: 25 U/L (ref 0–35)
AST: 29 U/L (ref 0–37)
Albumin: 3.9 g/dL (ref 3.5–5.2)
Alkaline Phosphatase: 88 U/L (ref 39–117)
Bilirubin, Direct: 0.1 mg/dL (ref 0.0–0.3)
Total Bilirubin: 0.4 mg/dL (ref 0.2–1.2)
Total Protein: 7.2 g/dL (ref 6.0–8.3)

## 2014-04-23 MED ORDER — CEFUROXIME AXETIL 500 MG PO TABS: 500.0000 mg | ORAL_TABLET | Freq: Two times a day (BID) | ORAL | Status: DC

## 2014-04-23 NOTE — Progress Notes (Signed)
Subjective:    Patient ID: [name], female    DOB: May 31, 1945, 69 y.o.   MRN: [id number]  HPI Patient seen for hospital follow-up for sepsis. She had actually been seen here just couple days before she developed acute symptoms over the weekend prior to admission of weakness, nausea, vomiting, and diarrhea. She had felt well and labs were all unremarkable here.  She had not complained of any urinary symptoms.   She was admitted and initially felt to have urosepsis. Her urine culture did grow out Escherichia coli which was resistant to Cipro and Levaquin and she had initially been treated with quinolone in the hospital. Day of discharge had one blood culture that grew out CAPNOCYTOPHAGA, which is a bacteria which can be seen from dog bites. She has risk factor of previous splenectomy. Patient was transitioned to Rocephin on 04/10/2014 . She was discharged with 10 day course of Ceftin. Repeat blood cultures were negative. C. difficile negative. She had renal ultrasound which showed no hydronephrosis and no stones. She had single elevated troponin in ICU on the 12th felt probably secondary to sepsis and question of demand ischemia. EKG showed no acute changes. Echocardiogram showed ejection rectal 60% with no wall motion abnormalities. She's not had any prior cardiac history.  She has some fatigue at this time but overall improved. No nausea or vomiting. Still has occasional loose stool -only once or twice per day. No bloody stools. Appetite is slowly improving. She has 1 more day of her cefuroxime 500 mg.  She does recall a dog bite approximate one week prior to admission. She was not having any wound drainage or any cellulitis changes at the time of her admission.  She failed to mention the dog bite to any of the physicians in hospital-until her day of discharge.  Past Medical History  Diagnosis Date  . VIRAL URI 03/06/2009  . Cervicalgia 05/19/2009  . WEIGHT GAIN 06/30/2008  . Headache(784.0)  05/19/2009  . SPRAIN AND STRAIN OF UNSPECIFIED SITE OF HAND 05/19/2009  . NEOPLASM, MALIGNANT, OVARY, HX OF 06/30/2008   Past Surgical History  Procedure Laterality Date  . Appendectomy    . Abdominal hysterectomy    . Ovarian cancer  2007  . Cesarean section    . Splenectomy  2007  . Gastric bypass  1979  . Transurethral resection of bladder  2007    LESION  . Renal calculi    . Cholecystectomy  2011    reports that she has never smoked. She does not have any smokeless tobacco history on file. She reports that she does not drink alcohol or use illicit drugs. family history includes Arthritis in her mother and other; Cancer in her sister; Cancer (age of onset: 65) in her father; Diabetes in her sister. Allergies  Allergen Reactions  . Morphine And Related Nausea And Vomiting  . Penicillins Hives      Review of Systems  Constitutional: Positive for fatigue. Negative for fever and chills.  Respiratory: Negative for cough and shortness of breath.   Cardiovascular: Negative for chest pain, palpitations and leg swelling.  Gastrointestinal: Negative for nausea, vomiting and abdominal pain.  Genitourinary: Negative for dysuria and hematuria.  Neurological: Negative for dizziness.       Objective:   Physical Exam  Constitutional: She appears well-developed and well-nourished.  Cardiovascular: Normal rate and regular rhythm.   Pulmonary/Chest: Effort normal and breath sounds normal. No respiratory distress. She has no wheezes. She has no rales.  Musculoskeletal: She exhibits no edema.  Skin: No rash noted.          Assessment & Plan:  Recent severe sepsis with positive blood culture for Capnocytophaga.  .clinically improved and stable at this time   She had positive urine culture for Escherichia coli but blood cultures were negative for Escherichia coli. 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 In any event, she is dramatically improved and doing well this point with recent course of Ceftin as above. Check CBC and comprehensive metabolic panel. We printed prescription for cefuroxime 500 milligrams twice a day to use if she has any recurrent high fever- particular the setting of dog bite or similar situation and to seek prompt medical care. Follow up with ID as scheduled.

## 2014-04-23 NOTE — Progress Notes (Signed)
Pre visit review using our clinic review tool, if applicable. No additional management support is needed unless otherwise documented below in the visit note. 

## 2014-04-25 LAB — URINE CULTURE
Colony Count: NO GROWTH
Organism ID, Bacteria: NO GROWTH

## 2014-05-07 ENCOUNTER — Telehealth: Payer: Self-pay | Admitting: Family Medicine

## 2014-05-07 NOTE — Telephone Encounter (Signed)
Error/njr °

## 2014-05-14 ENCOUNTER — Ambulatory Visit (INDEPENDENT_AMBULATORY_CARE_PROVIDER_SITE_OTHER): Payer: Medicare Other | Admitting: Infectious Disease

## 2014-05-14 ENCOUNTER — Encounter: Payer: Self-pay | Admitting: Infectious Disease

## 2014-05-14 DIAGNOSIS — R6521 Severe sepsis with septic shock: Secondary | ICD-10-CM | POA: Diagnosis not present

## 2014-05-14 DIAGNOSIS — A419 Sepsis, unspecified organism: Secondary | ICD-10-CM | POA: Diagnosis not present

## 2014-05-14 NOTE — Progress Notes (Signed)
Subjective:    Patient ID: [name], female    DOB: 1945/03/30, 69 y.o.   MRN: [id number]  HPI   69 year old with history of ovarian cancer sp surgery that was complicated by need for splenectomy. She was admitted to the hospital with septic shock from Capnocytophaga blood stream infection after having sustained a scrape from dog to her finger.   She was admitted to the ICU in septic shock and treated with vancomycin and levaquin. Her urine cultures were positive for a pan sensitive E coli but 1/2 admission blood cultures were positive for Capnocytophaga and I am confident that this pathogen was the culprit for her dramatic septic shock.  She was dc on oral ceftin. She is doing well and without acute complaints when I saw her last. She is still doing great and here to get surveillance blood cultures x 2.   She now recalls that [place] prior to getting ill she had been severely bitten by her dog when she was punishing it for biting a child. THat bite WAS severe and nearly led to her coming the ED.  Review of Systems  Constitutional: Negative for fever, chills, diaphoresis, activity change, appetite change, fatigue and unexpected weight change.  HENT: Negative for congestion, rhinorrhea, sinus pressure, sneezing, sore throat and trouble swallowing.   Eyes: Negative for photophobia and visual disturbance.  Respiratory: Negative for cough, chest tightness, shortness of breath, wheezing and stridor.   Cardiovascular: Negative for chest pain, palpitations and leg swelling.  Gastrointestinal: Negative for nausea, vomiting, abdominal pain, diarrhea, constipation, blood in stool, abdominal distention and anal bleeding.  Genitourinary: Negative for dysuria, hematuria, flank pain and difficulty urinating.  Musculoskeletal: Negative for myalgias, back pain, joint swelling, arthralgias and gait problem.  Skin: Positive for wound. Negative for color change, pallor and rash.  Neurological:  Negative for dizziness, tremors, weakness and light-headedness.  Hematological: Negative for adenopathy. Does not bruise/bleed easily.  Psychiatric/Behavioral: Negative for behavioral problems, confusion, sleep disturbance, dysphoric mood, decreased concentration and agitation.       Objective:   Physical Exam  Constitutional: She is oriented to person, place, and time. She appears well-developed and well-nourished. No distress.  HENT:  Head: Normocephalic and atraumatic.  Mouth/Throat: No oropharyngeal exudate.  Eyes: Conjunctivae and EOM are normal. No scleral icterus.  Neck: Normal range of motion. Neck supple.  Cardiovascular: Normal rate and regular rhythm.   Pulmonary/Chest: Effort normal. No respiratory distress. She has no wheezes.  Abdominal: She exhibits no distension.  Musculoskeletal: She exhibits no edema or tenderness.  Neurological: She is alert and oriented to person, place, and time. She exhibits normal muscle tone. Coordination normal.  Skin: Skin is warm and dry. No rash noted. She is not diaphoretic. No erythema. No pallor.  Psychiatric: She has a normal mood and affect. Her behavior is normal. Judgment and thought content normal.   Area where dog's teeth had scratched finger last visit.  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   It has been 3 weeks later and we will NOW check surveillance blood cultures.  I spent greater than 40 minutes with the patient including greater than 50% of time in face to  face counsel of the patient and in coordination of their care.   S/p splenectomy: gave  menactra last time.

## 2014-05-20 LAB — CULTURE, BLOOD (SINGLE)
Organism ID, Bacteria: NO GROWTH
Organism ID, Bacteria: NO GROWTH

## 2014-06-06 ENCOUNTER — Other Ambulatory Visit: Payer: Self-pay | Admitting: Family Medicine

## 2014-06-09 NOTE — Telephone Encounter (Signed)
Refill for 3 months. 

## 2014-06-09 NOTE — Telephone Encounter (Signed)
Last visit 04/23/14 Last refill 03/27/14 #30 2 refill

## 2014-06-13 ENCOUNTER — Ambulatory Visit (INDEPENDENT_AMBULATORY_CARE_PROVIDER_SITE_OTHER): Payer: Medicare Other | Admitting: Family Medicine

## 2014-06-13 VITALS — BP 120/80 | Temp 98.2°F | Resp 12 | Wt 184.0 lb

## 2014-06-13 DIAGNOSIS — R059 Cough, unspecified: Secondary | ICD-10-CM

## 2014-06-13 DIAGNOSIS — R05 Cough: Secondary | ICD-10-CM

## 2014-06-13 NOTE — Progress Notes (Signed)
Pre visit review using our clinic review tool, if applicable. No additional management support is needed unless otherwise documented below in the visit note. 

## 2014-06-14 NOTE — Progress Notes (Signed)
   Subjective:    Patient ID: [name], female    DOB: 12/08/1945, 69 y.o.   MRN: [id number]  HPI Acute visit.  Onset 2 days ago of cough and low grade fever yesterday of 100.1.  No fever today.   Mild sore throat.  Mild intermittent headache.  She is concerned because of prior hx of splenectomy. No focal sinus pressure.  Difficulty sleeping secondary to cough.  Appetite slightly down.  No nausea, vomiting, or diarrhea.  Past Medical History  Diagnosis Date  . VIRAL URI 03/06/2009  . Cervicalgia 05/19/2009  . WEIGHT GAIN 06/30/2008  . Headache(784.0) 05/19/2009  . SPRAIN AND STRAIN OF UNSPECIFIED SITE OF HAND 05/19/2009  . NEOPLASM, MALIGNANT, OVARY, HX OF 06/30/2008   Past Surgical History  Procedure Laterality Date  . Appendectomy    . Abdominal hysterectomy    . Ovarian cancer  2007  . Cesarean section    . Splenectomy  2007  . Gastric bypass  1979  . Transurethral resection of bladder  2007    LESION  . Renal calculi    . Cholecystectomy  2011    reports that she has never smoked. She does not have any smokeless tobacco history on file. She reports that she does not drink alcohol or use illicit drugs. family history includes Arthritis in her mother and other; Cancer in her sister; Cancer (age of onset: 50) in her father; Diabetes in her sister. Allergies  Allergen Reactions  . Morphine And Related Nausea And Vomiting  . Penicillins Hives      Review of Systems  Constitutional: Positive for fatigue.  HENT: Positive for sore throat.   Respiratory: Positive for cough.   Neurological: Positive for headaches.       Objective:   Physical Exam  Constitutional: She appears well-developed and well-nourished.  HENT:  Right Ear: External ear normal.  Left Ear: External ear normal.  Mouth/Throat: Oropharynx is clear and moist.  Neck: Neck supple.  Cardiovascular: Normal rate and regular rhythm.   Pulmonary/Chest: Effort normal and breath sounds normal. No respiratory  distress. She has no wheezes. She has no rales.  Lymphadenopathy:    She has no cervical adenopathy.  Neurological: She is alert.          Assessment & Plan:  Cough.  Probably viral but pt concerned with hx of splenectomy.  Hydocan cough syrup for night cough prn.  Ceftin 250 mg po bid for 10 days if she has any fever or worsening symptoms.

## 2014-06-18 ENCOUNTER — Ambulatory Visit (INDEPENDENT_AMBULATORY_CARE_PROVIDER_SITE_OTHER): Payer: Medicare Other | Admitting: Family Medicine

## 2014-06-18 ENCOUNTER — Encounter: Payer: Self-pay | Admitting: Family Medicine

## 2014-06-18 VITALS — BP 144/88 | HR 74 | Temp 98.5°F | Wt 182.4 lb

## 2014-06-18 DIAGNOSIS — M436 Torticollis: Secondary | ICD-10-CM | POA: Diagnosis not present

## 2014-06-18 MED ORDER — METHYLPREDNISOLONE 4 MG PO KIT: PACK | ORAL | Status: DC

## 2014-06-18 MED ORDER — METHOCARBAMOL 500 MG PO TABS: 500.0000 mg | ORAL_TABLET | Freq: Four times a day (QID) | ORAL | Status: DC | PRN

## 2014-06-18 NOTE — Progress Notes (Signed)
Pre visit review using our clinic review tool, if applicable. No additional management support is needed unless otherwise documented below in the visit note. 

## 2014-06-18 NOTE — Progress Notes (Signed)
   Subjective:    Patient ID: [name], female    DOB: 21-Dec-1945, 69 y.o.   MRN: [id number]  HPI Here for stiffness and pain in the neck that she woke up with 3 days ago., no recent trauma. Using heat and Advil.   Review of Systems  Constitutional: Negative.   Musculoskeletal: Positive for neck pain and neck stiffness.       Objective:   Physical Exam  Constitutional: She appears well-developed and well-nourished.  Musculoskeletal:  She has some spasm of the posterior neck muscles and the upper trapezei. She is tender in this region          Assessment & Plan:  Try  Medrol does pack and Robaxin. I suggested she get a massage as well

## 2014-06-27 ENCOUNTER — Encounter: Payer: Self-pay | Admitting: Family Medicine

## 2014-06-27 ENCOUNTER — Ambulatory Visit (INDEPENDENT_AMBULATORY_CARE_PROVIDER_SITE_OTHER): Payer: Medicare Other | Admitting: Family Medicine

## 2014-06-27 VITALS — BP 126/80 | HR 79 | Temp 98.1°F | Wt 179.0 lb

## 2014-06-27 DIAGNOSIS — M542 Cervicalgia: Secondary | ICD-10-CM | POA: Diagnosis not present

## 2014-06-27 MED ORDER — METHYLPREDNISOLONE 4 MG PO KIT: PACK | ORAL | Status: DC

## 2014-06-27 NOTE — Progress Notes (Signed)
Pre visit review using our clinic review tool, if applicable. No additional management support is needed unless otherwise documented below in the visit note. 

## 2014-06-27 NOTE — Progress Notes (Signed)
   Subjective:    Patient ID: [name], female    DOB: [birth year], 69 y.o.   MRN: [id number]  HPI Patient seen with recurrent neck pain. She was seen recently and placed on prednisone Dosepak and given muscle relaxer. She had substantial improvement with prednisone and almost total resolution of symptoms. After stopping prednisone for couple days she's had return of symptoms. She has some sharp pains right neck region which is worse with twisting or bending the neck. She has not had any radiculopathy symptoms. No upper extending numbness or weakness. She's tried some heat. Never went for massage. Denies any past history of cervical disc problems. No recent injury.  Past Medical History  Diagnosis Date  . VIRAL URI 03/06/2009  . Cervicalgia 05/19/2009  . WEIGHT GAIN 06/30/2008  . Headache(784.0) 05/19/2009  . SPRAIN AND STRAIN OF UNSPECIFIED SITE OF HAND 05/19/2009  . NEOPLASM, MALIGNANT, OVARY, HX OF 06/30/2008   Past Surgical History  Procedure Laterality Date  . Appendectomy    . Abdominal hysterectomy    . Ovarian cancer  2007  . Cesarean section    . Splenectomy  2007  . Gastric bypass  1979  . Transurethral resection of bladder  2007    LESION  . Renal calculi    . Cholecystectomy  2011    reports that she has never smoked. She does not have any smokeless tobacco history on file. She reports that she does not drink alcohol or use illicit drugs. family history includes Arthritis in her mother and other; Cancer in her sister; Cancer (age of onset: 22) in her father; Diabetes in her sister. Allergies  Allergen Reactions  . Morphine And Related Nausea And Vomiting  . Penicillins Hives      Review of Systems  Constitutional: Negative for fever and chills.  Musculoskeletal: Positive for back pain.  Neurological: Negative for weakness, numbness and headaches.       Objective:   Physical Exam  Constitutional: She appears well-developed and well-nourished. No distress.    Neck: Neck supple.  She has some minimal tenderness with palpation around the third fourth interspace right side.  Cardiovascular: Normal rate and regular rhythm.   Pulmonary/Chest: Effort normal and breath sounds normal.  Neurological:  Symmetric upper extremity reflexes. Good grip strength bilaterally.          Assessment & Plan:  Recurrent cervical neck pain. Improved compared to last visit. We gave 1 more taper prednisone. If she has recurrence after this episode consider physical therapy. Continue Robaxin for nighttime use and moist heat.

## 2014-07-29 ENCOUNTER — Telehealth: Payer: Self-pay | Admitting: Licensed Clinical Social Worker

## 2014-07-29 NOTE — Telephone Encounter (Signed)
Patient  recently hospitalized for Capnocytophaga called stating that she had another dog "snag" and blood was present. Patient started taking Ceftin again but wondered if she needs to come in towards the end of the week to have blood work since she is with out a spleen. Please advise

## 2014-07-29 NOTE — Telephone Encounter (Signed)
I would have her just finish her ceftin and then observe how she is doing. No need for blood work unless she is having more problems. I understand that she is not feeling badly just had cut from dog and took abx as precaution?

## 2014-07-29 NOTE — Telephone Encounter (Signed)
Excellent

## 2014-07-29 NOTE — Telephone Encounter (Signed)
That's correct. I left her a message to finish the Ceftin and observe for now.

## 2014-08-05 ENCOUNTER — Telehealth: Payer: Self-pay | Admitting: Family Medicine

## 2014-08-05 ENCOUNTER — Other Ambulatory Visit: Payer: Self-pay | Admitting: Family Medicine

## 2014-08-05 NOTE — Telephone Encounter (Signed)
Pt request refill cefUROXime (CEFTIN) 500 MG tablet  Pt needs b/c she keeps this on hand in case she gets a dog bite. 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 Pt took the abx and needs another just in case this happens again.  [facility]

## 2014-08-06 MED ORDER — CEFUROXIME AXETIL 500 MG PO TABS: 500.0000 mg | ORAL_TABLET | Freq: Two times a day (BID) | ORAL | Status: DC

## 2014-08-06 NOTE — Telephone Encounter (Signed)
Refill OK

## 2014-08-06 NOTE — Telephone Encounter (Signed)
Rx done and the pt was informed. 

## 2014-09-09 ENCOUNTER — Other Ambulatory Visit: Payer: Self-pay | Admitting: Family Medicine

## 2014-09-10 NOTE — Telephone Encounter (Signed)
Refill for 3 months. 

## 2014-09-10 NOTE — Telephone Encounter (Signed)
Last visit 06/27/14 Last refill 06/09/14 #30 2 refill

## 2014-10-22 DIAGNOSIS — L814 Other melanin hyperpigmentation: Secondary | ICD-10-CM | POA: Diagnosis not present

## 2014-10-22 DIAGNOSIS — D225 Melanocytic nevi of trunk: Secondary | ICD-10-CM | POA: Diagnosis not present

## 2014-10-22 DIAGNOSIS — L821 Other seborrheic keratosis: Secondary | ICD-10-CM | POA: Diagnosis not present

## 2014-10-22 DIAGNOSIS — L570 Actinic keratosis: Secondary | ICD-10-CM | POA: Diagnosis not present

## 2014-10-22 DIAGNOSIS — D1801 Hemangioma of skin and subcutaneous tissue: Secondary | ICD-10-CM | POA: Diagnosis not present

## 2014-11-19 DIAGNOSIS — H04123 Dry eye syndrome of bilateral lacrimal glands: Secondary | ICD-10-CM | POA: Diagnosis not present

## 2014-11-19 DIAGNOSIS — H1013 Acute atopic conjunctivitis, bilateral: Secondary | ICD-10-CM | POA: Diagnosis not present

## 2014-12-02 ENCOUNTER — Ambulatory Visit (INDEPENDENT_AMBULATORY_CARE_PROVIDER_SITE_OTHER): Payer: Medicare Other | Admitting: *Deleted

## 2014-12-02 ENCOUNTER — Ambulatory Visit: Payer: Medicare Other | Admitting: Family Medicine

## 2014-12-02 DIAGNOSIS — Z23 Encounter for immunization: Secondary | ICD-10-CM | POA: Diagnosis not present

## 2014-12-02 DIAGNOSIS — Z0000 Encounter for general adult medical examination without abnormal findings: Secondary | ICD-10-CM

## 2014-12-05 LAB — TB SKIN TEST
Induration: 0 mm
TB Skin Test: NEGATIVE

## 2014-12-16 ENCOUNTER — Other Ambulatory Visit: Payer: Self-pay | Admitting: Family Medicine

## 2014-12-17 ENCOUNTER — Ambulatory Visit (INDEPENDENT_AMBULATORY_CARE_PROVIDER_SITE_OTHER): Payer: Medicare Other | Admitting: Family Medicine

## 2014-12-17 ENCOUNTER — Encounter: Payer: Self-pay | Admitting: Family Medicine

## 2014-12-17 VITALS — BP 110/70 | HR 70 | Temp 98.4°F | Ht 62.0 in | Wt 185.2 lb

## 2014-12-17 DIAGNOSIS — H109 Unspecified conjunctivitis: Secondary | ICD-10-CM

## 2014-12-17 DIAGNOSIS — J209 Acute bronchitis, unspecified: Secondary | ICD-10-CM | POA: Diagnosis not present

## 2014-12-17 MED ORDER — HYDROCOD POLST-CPM POLST ER 10-8 MG/5ML PO SUER: 5.0000 mL | Freq: Two times a day (BID) | ORAL | Status: DC | PRN

## 2014-12-17 MED ORDER — POLYMYXIN B-TRIMETHOPRIM 10000-0.1 UNIT/ML-% OP SOLN: 2.0000 [drp] | OPHTHALMIC | Status: DC

## 2014-12-17 NOTE — Progress Notes (Signed)
Pre visit review using our clinic review tool, if applicable. No additional management support is needed unless otherwise documented below in the visit note. 

## 2014-12-17 NOTE — Patient Instructions (Signed)

## 2014-12-17 NOTE — Telephone Encounter (Signed)
Rx called in 

## 2014-12-17 NOTE — Telephone Encounter (Signed)
Requesting refill on Zolpidem (Ambien) 10 mg tablet #30 with 2 refills. Patent last seen 06/27/14. Last refill 06/16 #30 with 2 refills. Okay to refill?

## 2014-12-17 NOTE — Progress Notes (Signed)
   Subjective:    Patient ID: [name], female    DOB: 1945/07/16, 69 y.o.   MRN: [id number]  HPI Patient seen for acute visit. 2 day history of sore throat and nonproductive cough. Cough is been severe at times. She's had no fever. She is very concerned because of prior history of splenectomy. No major nasal congestion. She also complains of left matting and drainage for the past 2 days. No eye pain. No blurred vision. Nonsmoker. No history of smoking. Denies any nausea or vomiting. No sick contacts.  Past Medical History  Diagnosis Date  . VIRAL URI 03/06/2009  . Cervicalgia 05/19/2009  . WEIGHT GAIN 06/30/2008  . Headache(784.0) 05/19/2009  . SPRAIN AND STRAIN OF UNSPECIFIED SITE OF HAND 05/19/2009  . NEOPLASM, MALIGNANT, OVARY, HX OF 06/30/2008   Past Surgical History  Procedure Laterality Date  . Appendectomy    . Abdominal hysterectomy    . Ovarian cancer  2007  . Cesarean section    . Splenectomy  2007  . Gastric bypass  1979  . Transurethral resection of bladder  2007    LESION  . Renal calculi    . Cholecystectomy  2011    reports that she has never smoked. She does not have any smokeless tobacco history on file. She reports that she does not drink alcohol or use illicit drugs. family history includes Arthritis in her mother and other; Cancer in her sister; Cancer (age of onset: 1) in her father; Diabetes in her sister. Allergies  Allergen Reactions  . Morphine And Related Nausea And Vomiting  . Penicillins Hives      Review of Systems  Constitutional: Positive for fatigue. Negative for fever and chills.  HENT: Positive for sore throat. Negative for congestion.   Eyes: Positive for discharge. Negative for visual disturbance.  Respiratory: Positive for cough.   Cardiovascular: Negative for chest pain.       Objective:   Physical Exam  Constitutional: She appears well-developed and well-nourished. No distress.  HENT:  Right Ear: External ear normal.  Left  Ear: External ear normal.  Mild erythema posterior pharynx. No exudate  Eyes:  Left conjunctiva mildly erythematous compared to the right. Otherwise eye exam normal  Neck: Neck supple.  Cardiovascular: Normal rate and regular rhythm.   Pulmonary/Chest: Effort normal and breath sounds normal. No respiratory distress. She has no wheezes. She has no rales.  Lymphadenopathy:    She has no cervical adenopathy.          Assessment & Plan:  Acute bronchitis. Suspect viral. Tussionex 1 teaspoon daily at bedtime for severe cough. She'll try Mucinex during the day. Follow-up promptly for any fever. Suspect this is viral. Suspect mild left conjunctivitis. Polytrim ophthalmic drops every 4 hours while awake.

## 2014-12-17 NOTE — Telephone Encounter (Signed)
Refills OK for 3 months.

## 2015-01-28 DIAGNOSIS — H01021 Squamous blepharitis right upper eyelid: Secondary | ICD-10-CM | POA: Diagnosis not present

## 2015-01-28 DIAGNOSIS — H10413 Chronic giant papillary conjunctivitis, bilateral: Secondary | ICD-10-CM | POA: Diagnosis not present

## 2015-01-28 DIAGNOSIS — Z961 Presence of intraocular lens: Secondary | ICD-10-CM | POA: Diagnosis not present

## 2015-02-03 DIAGNOSIS — Z124 Encounter for screening for malignant neoplasm of cervix: Secondary | ICD-10-CM | POA: Diagnosis not present

## 2015-02-03 DIAGNOSIS — Z01419 Encounter for gynecological examination (general) (routine) without abnormal findings: Secondary | ICD-10-CM | POA: Diagnosis not present

## 2015-02-03 DIAGNOSIS — C569 Malignant neoplasm of unspecified ovary: Secondary | ICD-10-CM | POA: Diagnosis not present

## 2015-04-19 ENCOUNTER — Other Ambulatory Visit: Payer: Self-pay | Admitting: Family Medicine

## 2015-04-20 NOTE — Telephone Encounter (Signed)
Refill with one additional refill. 

## 2015-04-20 NOTE — Telephone Encounter (Signed)
Last appt was 12/17/2014 -- acute visit No pending scheduled Last refill was 09/11/2014 #30, 2 rf

## 2015-04-22 ENCOUNTER — Telehealth: Payer: Self-pay | Admitting: Family Medicine

## 2015-04-22 NOTE — Telephone Encounter (Signed)
Pt is following up on prior request for an increase for zolpidem (AMBIEN) 10 MG tablet  It is an increase from 15 tabs to 30 tabs. [facility] is sending [facility].

## 2015-04-24 NOTE — Telephone Encounter (Signed)
[place] pt is checking on statue of [place] for [place]. Please call patient

## 2015-04-24 NOTE — Telephone Encounter (Signed)
In order to complete PA, I need patient's updated insurance card. I notified patient of this, and patient verbalized understanding. Patient states that she will bring by insurance card this afternoon. Thanks!

## 2015-04-29 NOTE — Telephone Encounter (Signed)
Patient brought in new insurance card yesterday - [place] submitted for Ambien. Will await determination.

## 2015-04-30 ENCOUNTER — Telehealth: Payer: Self-pay | Admitting: Family Medicine

## 2015-04-30 NOTE — Telephone Encounter (Signed)
The patient called back and I gave her the information from [facility].

## 2015-05-18 ENCOUNTER — Other Ambulatory Visit: Payer: Self-pay

## 2015-05-18 ENCOUNTER — Telehealth: Payer: Self-pay | Admitting: Family Medicine

## 2015-05-18 DIAGNOSIS — Z1231 Encounter for screening mammogram for malignant neoplasm of breast: Secondary | ICD-10-CM

## 2015-05-18 NOTE — Telephone Encounter (Signed)
Left voice mail on personalized voice mail to call back and schedule an appointment. Pt must be seen.

## 2015-05-18 NOTE — Telephone Encounter (Signed)
Pt will see dr Burnice Logan tomorrow

## 2015-05-18 NOTE — Telephone Encounter (Signed)
Pt has frequent urination, slow stream, pt thinks she may have urinary tract infection. Would like to know if she can come by and leave a specimen?  Marland Kitchen

## 2015-05-18 NOTE — Telephone Encounter (Signed)
Patient needs an office visit.  

## 2015-05-19 ENCOUNTER — Ambulatory Visit: Payer: Medicare Other | Admitting: Internal Medicine

## 2015-05-21 ENCOUNTER — Ambulatory Visit (INDEPENDENT_AMBULATORY_CARE_PROVIDER_SITE_OTHER): Payer: Medicare Other | Admitting: Family Medicine

## 2015-05-21 DIAGNOSIS — R350 Frequency of micturition: Secondary | ICD-10-CM

## 2015-05-21 LAB — POCT URINALYSIS DIPSTICK
Bilirubin, UA: NEGATIVE
Glucose, UA: NEGATIVE
Ketones, UA: NEGATIVE
Nitrite, UA: NEGATIVE
Spec Grav, UA: 1.025
Urobilinogen, UA: 0.2
pH, UA: 5.5

## 2015-05-21 MED ORDER — ZOLPIDEM TARTRATE 10 MG PO TABS: 10.0000 mg | ORAL_TABLET | Freq: Every day | ORAL | Status: DC

## 2015-05-21 MED ORDER — NITROFURANTOIN MONOHYD MACRO 100 MG PO CAPS: 100.0000 mg | ORAL_CAPSULE | Freq: Two times a day (BID) | ORAL | Status: DC

## 2015-05-21 MED ORDER — ZOLPIDEM TARTRATE 10 MG PO TABS: ORAL_TABLET | ORAL | Status: DC

## 2015-05-21 NOTE — Progress Notes (Signed)
Pre visit review using our clinic review tool, if applicable. No additional management support is needed unless otherwise documented below in the visit note. 

## 2015-05-21 NOTE — Patient Instructions (Signed)

## 2015-05-21 NOTE — Progress Notes (Signed)
   Subjective:    Patient ID: [name], female    DOB: June 21, 1945, 70 y.o.   MRN: [id number]  HPI Patient developed some urinary frequency and urgency last week. Had some mild burning several days ago. Over the past few days her symptoms actually improved. Denies any back pain. No fevers or chills. No nausea or vomiting. No suprapubic pain or flank pain. No gross hematuria.  Past Medical History  Diagnosis Date  . VIRAL URI 03/06/2009  . Cervicalgia 05/19/2009  . WEIGHT GAIN 06/30/2008  . Headache(784.0) 05/19/2009  . SPRAIN AND STRAIN OF UNSPECIFIED SITE OF HAND 05/19/2009  . NEOPLASM, MALIGNANT, OVARY, HX OF 06/30/2008   Past Surgical History  Procedure Laterality Date  . Appendectomy    . Abdominal hysterectomy    . Ovarian cancer  2007  . Cesarean section    . Splenectomy  2007  . Gastric bypass  1979  . Transurethral resection of bladder  2007    LESION  . Renal calculi    . Cholecystectomy  2011    reports that she has never smoked. She does not have any smokeless tobacco history on file. She reports that she does not drink alcohol or use illicit drugs. family history includes Arthritis in her mother and other; Cancer in her sister; Cancer (age of onset: 66) in her father; Diabetes in her sister. Allergies  Allergen Reactions  . Morphine And Related Nausea And Vomiting  . Penicillins Hives      Review of Systems  Constitutional: Negative for fever and chills.  Gastrointestinal: Negative for abdominal pain.  Genitourinary: Positive for dysuria and frequency. Negative for hematuria and flank pain.       Objective:   Physical Exam  Constitutional: She appears well-developed and well-nourished.  Cardiovascular: Normal rate and regular rhythm.   Pulmonary/Chest: Effort normal and breath sounds normal. No respiratory distress. She has no wheezes. She has no rales.          Assessment & Plan:  Dysuria. Symptoms actually improving. Urine dipstick reveals  1+ blood and trace leukocytes. Urine culture sent. Written prescription for Macrobid 1 twice a day for 7 days to start if she develops any fever or worsening symptoms (pending urine culture results)

## 2015-05-24 LAB — URINE CULTURE: Colony Count: >=100000

## 2015-06-05 ENCOUNTER — Ambulatory Visit: Payer: Medicare Other

## 2015-06-19 ENCOUNTER — Telehealth: Payer: Self-pay | Admitting: Family Medicine

## 2015-06-19 MED ORDER — OSELTAMIVIR PHOSPHATE 75 MG PO CAPS: 75.0000 mg | ORAL_CAPSULE | Freq: Every day | ORAL | Status: DC

## 2015-06-19 NOTE — Telephone Encounter (Signed)
Medication sent in for patient. 

## 2015-06-19 NOTE — Telephone Encounter (Signed)
Start Tamiflu 75 mg once daily for 10 days

## 2015-06-19 NOTE — Telephone Encounter (Signed)
Pt states her grandson, (68 yrs old) lives her and had fever of 103 last night. They have just returned from the doctor,  and grandson has the flu. Pt is primary care giver and was advised to call PCP and request Tami flu.  [facility] [address]

## 2015-06-24 ENCOUNTER — Ambulatory Visit
Admission: RE | Admit: 2015-06-24 | Discharge: 2015-06-24 | Disposition: A | Discharge status: Home / Self Care | Payer: Medicare Other | Source: Ambulatory Visit

## 2015-06-24 DIAGNOSIS — Z1231 Encounter for screening mammogram for malignant neoplasm of breast: Secondary | ICD-10-CM | POA: Diagnosis not present

## 2015-07-20 DIAGNOSIS — Z961 Presence of intraocular lens: Secondary | ICD-10-CM | POA: Diagnosis not present

## 2015-07-20 DIAGNOSIS — H10413 Chronic giant papillary conjunctivitis, bilateral: Secondary | ICD-10-CM | POA: Diagnosis not present

## 2015-07-20 DIAGNOSIS — H01021 Squamous blepharitis right upper eyelid: Secondary | ICD-10-CM | POA: Diagnosis not present

## 2015-10-16 ENCOUNTER — Ambulatory Visit (INDEPENDENT_AMBULATORY_CARE_PROVIDER_SITE_OTHER): Payer: Medicare Other | Admitting: Family Medicine

## 2015-10-16 VITALS — BP 120/70 | HR 97 | Temp 98.8°F | Ht 62.0 in | Wt 185.0 lb

## 2015-10-16 DIAGNOSIS — M7072 Other bursitis of hip, left hip: Secondary | ICD-10-CM

## 2015-10-16 MED ORDER — METHYLPREDNISOLONE ACETATE 80 MG/ML IJ SUSP
80.0000 mg | Freq: Once | INTRAMUSCULAR | Status: AC
  Administered 2015-10-16: 80 mg via INTRA_ARTICULAR

## 2015-10-16 NOTE — Patient Instructions (Signed)
Hip Bursitis Bursitis is a swelling and soreness (inflammation) of a fluid-filled sac (bursa). This sac overlies and protects the joints.  CAUSES   Injury.  Overuse of the muscles surrounding the joint.  Arthritis.  Gout.  Infection.  Cold weather.  Inadequate warm-up and conditioning prior to activities. The cause may not be known.  SYMPTOMS   Mild to severe irritation.  Tenderness and swelling over the outside of the hip.  Pain with motion of the hip.  If the bursa becomes infected, a fever may be present. Redness, tenderness, and warmth will develop over the hip. Symptoms usually lessen in 3 to 4 weeks with treatment, but can come back. TREATMENT If conservative treatment does not work, your caregiver may advise draining the bursa and injecting cortisone into the area. This may speed up the healing process. This may also be used as an initial treatment of choice. HOME CARE INSTRUCTIONS   Apply ice to the affected area for 15-20 minutes every 3 to 4 hours while awake for the first 2 days. Put the ice in a plastic bag and place a towel between the bag of ice and your skin.  Rest the painful joint as much as possible, but continue to put the joint through a normal range of motion at least 4 times per day. When the pain lessens, begin normal, slow movements and usual activities to help prevent stiffness of the hip.  Only take over-the-counter or prescription medicines for pain, discomfort, or fever as directed by your caregiver.  Use crutches to limit weight bearing on the hip joint, if advised.  Elevate your painful hip to reduce swelling. Use pillows for propping and cushioning your legs and hips.  Gentle massage may provide comfort and decrease swelling. SEEK IMMEDIATE MEDICAL CARE IF:   Your pain increases even during treatment, or you are not improving.  You have a fever.  You have heat and inflammation over the involved bursa.  You have any other questions or  concerns. MAKE SURE YOU:   Understand these instructions.  Will watch your condition.  Will get help right away if you are not doing well or get worse.   This information is not intended to replace advice given to you by your health care provider. Make sure you discuss any questions you have with your health care provider.   Document Released: 09/03/2001 Document Revised: 06/06/2011 Document Reviewed: 10/14/2014 Elsevier Interactive Patient Education 2016 Elsevier Inc.  

## 2015-10-16 NOTE — Progress Notes (Signed)
Subjective:     Patient ID: [name], female   DOB: 03/01/1946, 70 y.o.   MRN: [id number]  HPI Patient seen with left hip pain. Denies any injury. Location is left lateral hip. Worse especially after prolonged periods of sitting or rest. She's tried ice and heat without relief. She has not noted any visible swelling. No low back pain. has also tried some Advil without relief Starting to impact her activities more Pain is consistently lateral hip. No medial hip pain. No weakness. Denies any recent fever, chills, or any appetite or weight changes.  Past Medical History  Diagnosis Date  . VIRAL URI 03/06/2009  . Cervicalgia 05/19/2009  . WEIGHT GAIN 06/30/2008  . Headache(784.0) 05/19/2009  . SPRAIN AND STRAIN OF UNSPECIFIED SITE OF HAND 05/19/2009  . NEOPLASM, MALIGNANT, OVARY, HX OF 06/30/2008   Past Surgical History  Procedure Laterality Date  . Appendectomy    . Abdominal hysterectomy    . Ovarian cancer  2007  . Cesarean section    . Splenectomy  2007  . Gastric bypass  1979  . Transurethral resection of bladder  2007    LESION  . Renal calculi    . Cholecystectomy  2011    reports that she has never smoked. She does not have any smokeless tobacco history on file. She reports that she does not drink alcohol or use illicit drugs. family history includes Arthritis in her mother and other; Cancer in her sister; Cancer (age of onset: 79) in her father; Diabetes in her sister. Allergies  Allergen Reactions  . Morphine And Related Nausea And Vomiting  . Penicillins Hives     Review of Systems  Constitutional: Negative for fever and chills.  Musculoskeletal: Negative for back pain.  Skin: Negative for rash.       Objective:   Physical Exam  Constitutional: She appears well-developed and well-nourished.  Cardiovascular: Normal rate and regular rhythm.   Pulmonary/Chest: Effort normal and breath sounds normal. No respiratory distress. She has no wheezes. She has no  rales.  Musculoskeletal: She exhibits no edema.  Left hip excellent range of motion. No leg edema. Very tender over the left greater trochanteric bursa.  Neurological:  Full-strength lower extremities       Assessment:     Left hip pain. Suspect greater trochanteric bursitis. Not relieved with conservative therapies including Advil and ice    Plan:     We discussed risk and benefits of steroid injection left greater trochanteric bursa. We discussed risks including infection, bruising, and bleeding. Patient consented. Skin prepped with Betadine. Using 25-gauge one and 1/2 inch needle injected 1 mL of Depo-Medrol and 2 mL of plain Xylocaine and patient tolerated well. She will continue with icing over the next couple days. Touch base in 1-2 weeks if no improvement  [name] MD [facility] at [facility]

## 2015-10-16 NOTE — Progress Notes (Signed)
Pre visit review using our clinic review tool, if applicable. No additional management support is needed unless otherwise documented below in the visit note. 

## 2015-10-21 DIAGNOSIS — L821 Other seborrheic keratosis: Secondary | ICD-10-CM | POA: Diagnosis not present

## 2015-10-21 DIAGNOSIS — D1801 Hemangioma of skin and subcutaneous tissue: Secondary | ICD-10-CM | POA: Diagnosis not present

## 2015-10-21 DIAGNOSIS — D235 Other benign neoplasm of skin of trunk: Secondary | ICD-10-CM | POA: Diagnosis not present

## 2015-10-21 DIAGNOSIS — L814 Other melanin hyperpigmentation: Secondary | ICD-10-CM | POA: Diagnosis not present

## 2015-11-18 ENCOUNTER — Ambulatory Visit (INDEPENDENT_AMBULATORY_CARE_PROVIDER_SITE_OTHER): Payer: Medicare Other | Admitting: Family Medicine

## 2015-11-18 ENCOUNTER — Encounter: Payer: Self-pay | Admitting: Family Medicine

## 2015-11-18 DIAGNOSIS — R062 Wheezing: Secondary | ICD-10-CM

## 2015-11-18 DIAGNOSIS — R05 Cough: Secondary | ICD-10-CM | POA: Diagnosis not present

## 2015-11-18 DIAGNOSIS — R059 Cough, unspecified: Secondary | ICD-10-CM

## 2015-11-18 MED ORDER — ALBUTEROL SULFATE HFA 108 (90 BASE) MCG/ACT IN AERS: 2.0000 | INHALATION_SPRAY | RESPIRATORY_TRACT | 1 refills | Status: DC | PRN

## 2015-11-18 MED ORDER — ZOLPIDEM TARTRATE 10 MG PO TABS: ORAL_TABLET | ORAL | 5 refills | Status: DC

## 2015-11-18 MED ORDER — METHYLPREDNISOLONE ACETATE 80 MG/ML IJ SUSP
80.0000 mg | Freq: Once | INTRAMUSCULAR | Status: AC
Start: 2015-11-18 — End: 2015-11-18
  Administered 2015-11-18: 80 mg via INTRAMUSCULAR

## 2015-11-18 NOTE — Progress Notes (Signed)
Subjective:     Patient ID: [name], female   DOB: June 26, 1945, 70 y.o.   MRN: [id number]  HPI Acute visit for upper resperatory infection. Onset 5 days ago. Initially had some laryngitis symptoms and mostly dry cough. Cough is becoming slightly more productive. Now also has some nasal congestion. No fevers or chills. Increased malaise. She is aware of some wheezing. No history of asthma. Nonsmoker. She is concerned because of prior history of splenectomy.  Past Medical History:  Diagnosis Date  . Cervicalgia 05/19/2009  . Headache(784.0) 05/19/2009  . NEOPLASM, MALIGNANT, OVARY, HX OF 06/30/2008  . SPRAIN AND STRAIN OF UNSPECIFIED SITE OF HAND 05/19/2009  . VIRAL URI 03/06/2009  . WEIGHT GAIN 06/30/2008   Past Surgical History:  Procedure Laterality Date  . ABDOMINAL HYSTERECTOMY    . APPENDECTOMY    . CESAREAN SECTION    . CHOLECYSTECTOMY  2011  . GASTRIC BYPASS  1979  . ovarian cancer  2007  . RENAL CALCULI    . SPLENECTOMY  2007  . TRANSURETHRAL RESECTION OF BLADDER  2007   LESION    reports that she has never smoked. She does not have any smokeless tobacco history on file. She reports that she does not drink alcohol or use drugs. family history includes Arthritis in her mother and other; Cancer in her sister; Cancer (age of onset: 45) in her father; Diabetes in her sister. Allergies  Allergen Reactions  . Morphine And Related Nausea And Vomiting  . Penicillins Hives     Review of Systems  Constitutional: Positive for fatigue. Negative for chills and fever.  HENT: Positive for congestion and voice change. Negative for sore throat.   Respiratory: Positive for cough and wheezing.   Cardiovascular: Negative for chest pain.       Objective:   Physical Exam  Constitutional: She appears well-developed and well-nourished.  HENT:  Right Ear: External ear normal.  Left Ear: External ear normal.  Mouth/Throat: Oropharynx is clear and moist.  Neck: Neck supple.   Cardiovascular: Normal rate and regular rhythm.   Pulmonary/Chest: Effort normal. No respiratory distress. She has wheezes. She has no rales.  Lymphadenopathy:    She has no cervical adenopathy.       Assessment:     Cough with evidence for reactive airway component. No respiratory distress. Probable viral trigger    Plan:     -Depo-Medrol 80 mg IM. We discussed possible oral steroid prednisone taper but she's had more side effects previously with oral versus IM and patient requested IM -Take over-the-counter Mucinex twice daily -Stable well-hydrated -Follow-up promptly for any fever or increased shortness of breath -Albuterol 2 puffs every 4-6 hours as needed for wheezing and cough  [name] MD [facility] at [facility]

## 2015-11-18 NOTE — Addendum Note (Signed)
Addended by: Elio Forget on: 11/18/2015 03:04 PM   Modules accepted: Orders

## 2015-11-18 NOTE — Patient Instructions (Signed)
Follow up for any fever or increased shortness of breath. 

## 2015-11-18 NOTE — Progress Notes (Signed)
Pre visit review using our clinic review tool, if applicable. No additional management support is needed unless otherwise documented below in the visit note. 

## 2015-11-27 ENCOUNTER — Other Ambulatory Visit: Payer: Self-pay | Admitting: Family Medicine

## 2016-01-13 ENCOUNTER — Ambulatory Visit (INDEPENDENT_AMBULATORY_CARE_PROVIDER_SITE_OTHER): Payer: Medicare Other

## 2016-01-13 DIAGNOSIS — Z23 Encounter for immunization: Secondary | ICD-10-CM

## 2016-03-16 ENCOUNTER — Ambulatory Visit (INDEPENDENT_AMBULATORY_CARE_PROVIDER_SITE_OTHER): Payer: Medicare Other | Admitting: Family Medicine

## 2016-03-16 ENCOUNTER — Ambulatory Visit: Payer: Medicare Other | Admitting: Family Medicine

## 2016-03-16 DIAGNOSIS — M7072 Other bursitis of hip, left hip: Secondary | ICD-10-CM | POA: Diagnosis not present

## 2016-03-16 MED ORDER — METHYLPREDNISOLONE ACETATE 80 MG/ML IJ SUSP
80.0000 mg | Freq: Once | INTRAMUSCULAR | Status: AC
Start: 2016-03-16 — End: 2016-03-16
  Administered 2016-03-16: 80 mg via INTRA_ARTICULAR

## 2016-03-16 NOTE — Progress Notes (Signed)
Pre visit review using our clinic review tool, if applicable. No additional management support is needed unless otherwise documented below in the visit note. 

## 2016-03-16 NOTE — Progress Notes (Signed)
Subjective:     Patient ID: [name], female   DOB: 22-Mar-1946, 70 y.o.   MRN: [id number]  HPI Patient seen with recurrent left lateral hip pain. Last July she had severe lateral hip pain and we suspected bursitis. She received steroid injection and had almost total relief of her hip pain for about 3 months. 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 Denies any injury. She has not had any anterior or medial hip pain. No radiculopathy symptoms. Has tried icing without improvement  Past Medical History:  Diagnosis Date  . Cervicalgia 05/19/2009  . Headache(784.0) 05/19/2009  . NEOPLASM, MALIGNANT, OVARY, HX OF 06/30/2008  . SPRAIN AND STRAIN OF UNSPECIFIED SITE OF HAND 05/19/2009  . VIRAL URI 03/06/2009  . WEIGHT GAIN 06/30/2008   Past Surgical History:  Procedure Laterality Date  . ABDOMINAL HYSTERECTOMY    . APPENDECTOMY    . CESAREAN SECTION    . CHOLECYSTECTOMY  2011  . GASTRIC BYPASS  1979  . ovarian cancer  2007  . RENAL CALCULI    . SPLENECTOMY  2007  . TRANSURETHRAL RESECTION OF BLADDER  2007   LESION    reports that she has never smoked. She does not have any smokeless tobacco history on file. She reports that she does not drink alcohol or use drugs. family history includes Arthritis in her mother and other; Cancer in her sister; Cancer (age of onset: 22) in her father; Diabetes in her sister. Allergies  Allergen Reactions  . Morphine And Related Nausea And Vomiting  . Penicillins Hives     Review of Systems  Constitutional: Negative for chills and fever.  Musculoskeletal: Negative for back pain.  Neurological: Negative for weakness and numbness.       Objective:   Physical Exam  Constitutional: She appears well-developed and well-nourished.  Cardiovascular: Normal rate and regular rhythm.   Pulmonary/Chest: Effort normal and breath sounds normal.  No respiratory distress. She has no wheezes. She has no rales.  Musculoskeletal:  Full range of motion left hip. She has some tenderness over the greater trochanteric bursa       Assessment:     Greater trochanteric bursitis left hip    Plan:     -We discussed risk and benefits of steroid injection. She's had good relief previously with this for several months. Patient consented. Skin prepped with Betadine. Using 25-gauge one and 1/2 inch needle injected 1 mL of Depo-Medrol and 2 mL of plain Xylocaine. Patient tolerated well  [name] MD [facility] at [facility]

## 2016-03-16 NOTE — Patient Instructions (Signed)
Hip Bursitis Introduction Hip bursitis is inflammation of a fluid-filled sac (bursa) in the hip joint. The bursa protects the bones in the hip joint from rubbing against each other. Hip bursitis can cause mild to moderate pain, and symptoms often come and go over time. What are the causes? This condition may be caused by:  Injury to the hip.  Overuse of the muscles that surround the hip joint.  Arthritis or gout.  Diabetes.  Thyroid disease.  Cold weather.  Infection. In some cases, the cause may not be known. What are the signs or symptoms? Symptoms of this condition may include:  Mild or moderate pain in the hip area. Pain may get worse with movement.  Tenderness and swelling of the hip, especially on the outer side of the hip. Symptoms may come and go. If the bursa becomes infected, you may have the following symptoms:  Fever.  Red skin and a feeling of warmth in the hip area. How is this diagnosed? This condition may be diagnosed based on:  A physical exam.  Your medical history.  X-rays.  Removal of fluid from your inflamed bursa for testing (biopsy). You may be sent to a health care provider who specializes in bone diseases (orthopedist) or a provider who specializes in joint inflammation (rheumatologist). How is this treated? This condition is treated by resting, raising (elevating), and applying pressure(compression) to the injured area. In some cases, this may be enough to make your symptoms go away. Treatment may also include:  Crutches.  Antibiotic medicine.  Draining fluid out of the bursa to help relieve swelling.  Injecting medicine that helps to reduce inflammation (cortisone). Follow these instructions at home: Medicines  Take over-the-counter and prescription medicines only as told by your health care provider.  Do not drive or operate heavy machinery while taking prescription pain medicine, or as told by your health care provider.  If you  were prescribed an antibiotic, take it as told by your health care provider. Do not stop taking the antibiotic even if you start to feel better. Activity  Return to your normal activities as told by your health care provider. Ask your health care provider what activities are safe for you.  Rest and protect your hip as much as possible until your pain and swelling get better. General instructions  Wear compression wraps only as told by your health care provider.  Elevate your hip above the level of your heart as much as you can without pain. To do this, try putting a pillow under your hips while you lie down.  Do not use your hip to support your body weight until your health care provider says that you can. Use crutches as told by your health care provider.  Gently massage and stretch your injured area as often as is comfortable.  Keep all follow-up visits as told by your health care provider. This is important. How is this prevented?  Exercise regularly, as told by your health care provider.  Warm up and stretch before being active.  Cool down and stretch after being active.  If an activity irritates your hip or causes pain, avoid the activity as much as possible.  Avoid sitting down for long periods at a time. Contact a health care provider if:  You have a fever.  You develop new symptoms.  You have difficulty walking or doing everyday activities.  You have pain that gets worse or does not get better with medicine.  You develop red skin or  a feeling of warmth in your hip area. Get help right away if:  You cannot move your hip.  You have severe pain. This information is not intended to replace advice given to you by your health care provider. Make sure you discuss any questions you have with your health care provider. Document Released: 09/03/2001 Document Revised: 08/20/2015 Document Reviewed: 10/14/2014  2017 Elsevier

## 2016-05-16 ENCOUNTER — Other Ambulatory Visit: Payer: Self-pay | Admitting: Family Medicine

## 2016-05-16 DIAGNOSIS — Z1231 Encounter for screening mammogram for malignant neoplasm of breast: Secondary | ICD-10-CM

## 2016-05-24 DIAGNOSIS — C569 Malignant neoplasm of unspecified ovary: Secondary | ICD-10-CM | POA: Diagnosis not present

## 2016-05-24 DIAGNOSIS — Z124 Encounter for screening for malignant neoplasm of cervix: Secondary | ICD-10-CM | POA: Diagnosis not present

## 2016-05-24 DIAGNOSIS — Z01419 Encounter for gynecological examination (general) (routine) without abnormal findings: Secondary | ICD-10-CM | POA: Diagnosis not present

## 2016-05-26 ENCOUNTER — Other Ambulatory Visit: Payer: Self-pay | Admitting: Family Medicine

## 2016-05-27 ENCOUNTER — Encounter: Payer: Self-pay | Admitting: Internal Medicine

## 2016-05-27 ENCOUNTER — Ambulatory Visit (INDEPENDENT_AMBULATORY_CARE_PROVIDER_SITE_OTHER): Payer: Medicare Other | Admitting: Internal Medicine

## 2016-05-27 VITALS — BP 132/62 | HR 78 | Temp 97.7°F | Ht 62.0 in | Wt 182.4 lb

## 2016-05-27 DIAGNOSIS — S50872A Other superficial bite of left forearm, initial encounter: Secondary | ICD-10-CM

## 2016-05-27 DIAGNOSIS — W540XXA Bitten by dog, initial encounter: Secondary | ICD-10-CM | POA: Diagnosis not present

## 2016-05-27 MED ORDER — CEFUROXIME AXETIL 500 MG PO TABS: 500.0000 mg | ORAL_TABLET | Freq: Two times a day (BID) | ORAL | 1 refills | Status: DC

## 2016-05-27 NOTE — Progress Notes (Signed)
Subjective:    Patient ID: [name], female    DOB: Jan 25, 1946, 71 y.o.   MRN: [id number]  HPI  71 year old patient who has a history of a dog bite approximately 2 years ago complicated by sepsis syndrome.  She has a history of a prior splenectomy. She recently has purchased a rescue dog over the past few days and yesterday sustained a bite to the distal left outer arm near the wrist. The patient has been prescribed Ceftin 500 mg to have on hand in case of dog bit due to her history of sepsis in the past.  The wound initially bled  rather briskly and she has been applying local antibiotic ointment.  She only took 1  Ceftin tablet earlier today because the antibiotics were past the expiration date  Past Medical History:  Diagnosis Date  . Cervicalgia 05/19/2009  . Headache(784.0) 05/19/2009  . NEOPLASM, MALIGNANT, OVARY, HX OF 06/30/2008  . SPRAIN AND STRAIN OF UNSPECIFIED SITE OF HAND 05/19/2009  . VIRAL URI 03/06/2009  . WEIGHT GAIN 06/30/2008     Social History   Social History  . Marital status: Married    Spouse name: N/A  . Number of children: N/A  . Years of education: N/A   Occupational History  . Not on file.   Social History Main Topics  . Smoking status: Never Smoker  . Smokeless tobacco: Never Used  . Alcohol use No  . Drug use: No  . Sexual activity: Not Currently   Other Topics Concern  . Not on file   Social History Narrative  . No narrative on file    Past Surgical History:  Procedure Laterality Date  . ABDOMINAL HYSTERECTOMY    . APPENDECTOMY    . CESAREAN SECTION    . CHOLECYSTECTOMY  2011  . GASTRIC BYPASS  1979  . ovarian cancer  2007  . RENAL CALCULI    . SPLENECTOMY  2007  . TRANSURETHRAL RESECTION OF BLADDER  2007   LESION    Family History  Problem Relation Age of Onset  . Arthritis Other   . Arthritis Mother     ?osteoarthritis  . Cancer Father 80    lymphoma  . Cancer Sister     ovaian cancer  . Diabetes Sister     type  2     Allergies  Allergen Reactions  . Morphine And Related Nausea And Vomiting  . Penicillins Hives    Current Outpatient Prescriptions on File Prior to Visit  Medication Sig Dispense Refill  . calcium carbonate (OS-CAL - DOSED IN MG OF ELEMENTAL CALCIUM) 1250 MG tablet Take 2 tablets by mouth daily.     . Multiple Vitamin (MULTIVITAMIN) tablet Take 1 tablet by mouth daily.     Marland Kitchen zolpidem (AMBIEN) 10 MG tablet Take one po qhs. May fill on or after March 1. 30 tablet 5   No current facility-administered medications on file prior to visit.     BP 132/62 (BP Location: Right Arm, Patient Position: Sitting, Cuff Size: Normal)   Pulse 78   Temp 97.7 F (36.5 C) (Oral)   Ht 5\' 2"  (1.575 m)   Wt 182 lb 6.4 oz (82.7 kg)   SpO2 99%   BMI 33.36 kg/m      Review of Systems  Constitutional: Negative.   Skin: Positive for wound.       Objective:   Physical Exam  Constitutional: She appears well-developed and well-nourished. No distress.  Afebrile  Skin:  A superficial appearing V-shaped laceration noted involving the outer aspect of the distal left arm.  No surrounding erythema.  No local tenderness.  No drainage          Assessment & Plan:   Dog bite, left distal arm.  Will treat aggressively with Ceftin 500 mg twice a day for 10 days.  She will report any clinical worsening.  Patient has a history of prior splenectomy and  severe sepsis syndrome secondary to an apparent minor scrape from dog to her finger in the past.  [name]

## 2016-05-27 NOTE — Telephone Encounter (Signed)
Do we know why she is requesting?  I do know she had complications previously from dog bite with unusual organism and that is when she got put on Ceftin.

## 2016-05-27 NOTE — Telephone Encounter (Addendum)
Pt states they got a new dog from the pound.  They were playing and the dog accidentally bit her. More like a scratch with its sharp teeth, but she does not want to take any chances.  Pt very worried because of what happened last time she was bit.  The bottle she had on had has expired.   cefUROXime (CEFTIN) 500 MG tablet    I made her an appt with Dr Raliegh Ip this afternoon at 4:15 pm unless you want to send in.  I advised pt not sure if you would be able to address this, dur to Dr Elease Hashimoto being out of the office.   Thanks. [facility]/ [employer]

## 2016-05-27 NOTE — Patient Instructions (Addendum)
Take your antibiotic as prescribed until ALL of it is gone, but stop if you develop a rash, swelling, or any side effects of the medication.  Contact our office as soon as possible if  there are side effects of the medication.   Animal Bite Animal bite wounds can get infected. It is important to get proper medical treatment. Ask your doctor if you need rabies treatment. Follow these instructions at home: Wound care   Follow instructions from your doctor about how to take care of your wound. Make sure you:  Wash your hands with soap and water before you change your bandage (dressing). If you cannot use soap and water, use hand sanitizer.  Change your bandage as told by your doctor.  Leave stitches (sutures), skin glue, or skin tape (adhesive) strips in place. They may need to stay in place for 2 weeks or longer. If tape strips get loose and curl up, you may trim the loose edges. Do not remove tape strips completely unless your doctor says it is okay.  Check your wound every day for signs of infection. Watch for:  Redness, swelling, or pain that gets worse.  Fluid, blood, or pus. General instructions   Take or apply over-the-counter and prescription medicines only as told by your doctor.  If you were prescribed an antibiotic, take or apply it as told by your doctor. Do not stop using the antibiotic even if your condition improves.  Keep the injured area raised (elevated) above the level of your heart while you are sitting or lying down.  If directed, apply ice to the injured area.  Put ice in a plastic bag.  Place a towel between your skin and the bag.  Leave the ice on for 20 minutes, 2-3 times per day.  Keep all follow-up visits as told by your doctor. This is important. Contact a doctor if:  You have redness, swelling, or pain that gets worse.  You have a general feeling of sickness (malaise).  You feel sick to your stomach (nauseous).  You throw up (vomit).  You have  pain that does not get better. Get help right away if:  You have a red streak going away from your wound.  You have fluid, blood, or pus coming from your wound.  You have a fever or chills.  You have trouble moving your injured area.  You have numbness or tingling anywhere on your body. This information is not intended to replace advice given to you by your health care provider. Make sure you discuss any questions you have with your health care provider. Document Released: 03/14/2005 Document Revised: 08/20/2015 Document Reviewed: 07/30/2014 Elsevier Interactive Patient Education  2017 [employer].

## 2016-05-27 NOTE — Progress Notes (Signed)
Pre visit review using our clinic review tool, if applicable. No additional management support is needed unless otherwise documented below in the visit note. 

## 2016-05-29 NOTE — Telephone Encounter (Signed)
Pt saw another provider in my absence and had Ceftin sent in.

## 2016-05-30 NOTE — Telephone Encounter (Signed)
Noted  

## 2016-05-31 ENCOUNTER — Other Ambulatory Visit: Payer: Self-pay | Admitting: Family Medicine

## 2016-06-01 NOTE — Telephone Encounter (Signed)
Refill #30 with 5 additional refills. 

## 2016-06-01 NOTE — Telephone Encounter (Signed)
Last refill 11/18/15.  Last office visit 03/16/16.  Okay to fill?

## 2016-06-27 ENCOUNTER — Ambulatory Visit
Admission: RE | Admit: 2016-06-27 | Discharge: 2016-06-27 | Disposition: A | Discharge status: Home / Self Care | Payer: Medicare Other | Source: Ambulatory Visit | Attending: Family Medicine | Admitting: Family Medicine

## 2016-06-27 DIAGNOSIS — Z1231 Encounter for screening mammogram for malignant neoplasm of breast: Secondary | ICD-10-CM

## 2016-07-14 DIAGNOSIS — H0012 Chalazion right lower eyelid: Secondary | ICD-10-CM | POA: Diagnosis not present

## 2016-07-14 DIAGNOSIS — Z961 Presence of intraocular lens: Secondary | ICD-10-CM | POA: Diagnosis not present

## 2016-08-15 ENCOUNTER — Encounter: Payer: Self-pay | Admitting: Family Medicine

## 2016-08-15 ENCOUNTER — Ambulatory Visit (INDEPENDENT_AMBULATORY_CARE_PROVIDER_SITE_OTHER): Payer: Medicare Other | Admitting: Family Medicine

## 2016-08-15 VITALS — BP 120/78 | HR 73 | Temp 98.4°F | Wt 178.7 lb

## 2016-08-15 DIAGNOSIS — J069 Acute upper respiratory infection, unspecified: Secondary | ICD-10-CM | POA: Diagnosis not present

## 2016-08-15 DIAGNOSIS — B9789 Other viral agents as the cause of diseases classified elsewhere: Secondary | ICD-10-CM | POA: Diagnosis not present

## 2016-08-15 MED ORDER — HYDROCOD POLST-CPM POLST ER 10-8 MG/5ML PO SUER: 5.0000 mL | Freq: Two times a day (BID) | ORAL | 0 refills | Status: DC | PRN

## 2016-08-15 NOTE — Progress Notes (Signed)
Subjective:     Patient ID: [name], female   DOB: 15-Jan-1946, 71 y.o.   MRN: [id number]  HPI Patient seen with onset last Friday of sore throat. She then developed over the weekend some nasal congestion and dry cough. Her cough is especially bothersome at night. She tried some over-the-counter Robitussin-DM without relief. She has history of previous splenectomy. She's had no fevers or chills. No hemoptysis. Increased diffuse body aches. No nausea or vomiting. 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 Nonsmoker.  Past Medical History:  Diagnosis Date  . Cervicalgia 05/19/2009  . Headache(784.0) 05/19/2009  . NEOPLASM, MALIGNANT, OVARY, HX OF 06/30/2008  . SPRAIN AND STRAIN OF UNSPECIFIED SITE OF HAND 05/19/2009  . VIRAL URI 03/06/2009  . WEIGHT GAIN 06/30/2008   Past Surgical History:  Procedure Laterality Date  . ABDOMINAL HYSTERECTOMY    . APPENDECTOMY    . CESAREAN SECTION    . CHOLECYSTECTOMY  2011  . GASTRIC BYPASS  1979  . ovarian cancer  2007  . RENAL CALCULI    . SPLENECTOMY  2007  . TRANSURETHRAL RESECTION OF BLADDER  2007   LESION    reports that she has never smoked. She has never used smokeless tobacco. She reports that she does not drink alcohol or use drugs. family history includes Arthritis in her mother and other; Cancer in her sister; Cancer (age of onset: 28) in her father; Diabetes in her sister. Allergies  Allergen Reactions  . Morphine And Related Nausea And Vomiting  . Penicillins Hives     Review of Systems  Constitutional: Positive for fatigue. Negative for chills and fever.  HENT: Positive for congestion and sore throat.   Respiratory: Positive for cough.        Objective:   Physical Exam  Constitutional: She appears well-developed and well-nourished.  HENT:  Right Ear: External ear normal.  Left Ear: External ear normal.  Mouth/Throat: Oropharynx is clear and moist. No  oropharyngeal exudate.  Neck: Neck supple.  Cardiovascular: Normal rate and regular rhythm.   Pulmonary/Chest: Effort normal and breath sounds normal. No respiratory distress. She has no wheezes. She has no rales.  Lymphadenopathy:    She has no cervical adenopathy.       Assessment:     Probable viral URI with cough. Nonfocal exam.    Plan:     -Tussionex 1 teaspoon daily at bedtime when necessary for severe cough -Follow-up immediately for any fever or increasing shortness of breath  [name] MD [facility] at [facility]

## 2016-08-15 NOTE — Patient Instructions (Signed)
Follow up for any fever or increased shortness of breath. 

## 2016-09-08 DIAGNOSIS — H01022 Squamous blepharitis right lower eyelid: Secondary | ICD-10-CM | POA: Diagnosis not present

## 2016-09-08 DIAGNOSIS — H01024 Squamous blepharitis left upper eyelid: Secondary | ICD-10-CM | POA: Diagnosis not present

## 2016-09-08 DIAGNOSIS — H10413 Chronic giant papillary conjunctivitis, bilateral: Secondary | ICD-10-CM | POA: Diagnosis not present

## 2016-09-08 DIAGNOSIS — H01025 Squamous blepharitis left lower eyelid: Secondary | ICD-10-CM | POA: Diagnosis not present

## 2016-09-08 DIAGNOSIS — Z961 Presence of intraocular lens: Secondary | ICD-10-CM | POA: Diagnosis not present

## 2016-09-08 DIAGNOSIS — H01021 Squamous blepharitis right upper eyelid: Secondary | ICD-10-CM | POA: Diagnosis not present

## 2016-09-22 DIAGNOSIS — N201 Calculus of ureter: Secondary | ICD-10-CM | POA: Diagnosis not present

## 2016-10-26 DIAGNOSIS — L821 Other seborrheic keratosis: Secondary | ICD-10-CM | POA: Diagnosis not present

## 2016-10-26 DIAGNOSIS — L814 Other melanin hyperpigmentation: Secondary | ICD-10-CM | POA: Diagnosis not present

## 2016-10-26 DIAGNOSIS — L570 Actinic keratosis: Secondary | ICD-10-CM | POA: Diagnosis not present

## 2016-10-26 DIAGNOSIS — D225 Melanocytic nevi of trunk: Secondary | ICD-10-CM | POA: Diagnosis not present

## 2016-11-03 ENCOUNTER — Telehealth: Payer: Self-pay | Admitting: Family Medicine

## 2016-11-03 NOTE — Telephone Encounter (Signed)
Pt would like to have cologuard test instead of colonoscopy. Pt had colonoscopy in past with no polyps. Please advise. Pt is aware md out of office today

## 2016-11-04 NOTE — Telephone Encounter (Signed)
Okay to order cologuard?  

## 2016-11-04 NOTE — Telephone Encounter (Signed)
Orders faxed

## 2016-11-04 NOTE — Telephone Encounter (Signed)
Ok to order 

## 2016-11-11 ENCOUNTER — Encounter: Payer: Self-pay | Admitting: Family Medicine

## 2016-11-11 ENCOUNTER — Ambulatory Visit (INDEPENDENT_AMBULATORY_CARE_PROVIDER_SITE_OTHER): Payer: Medicare Other | Admitting: Family Medicine

## 2016-11-11 VITALS — BP 120/80 | HR 75 | Temp 98.0°F | Wt 175.9 lb

## 2016-11-11 DIAGNOSIS — L03012 Cellulitis of left finger: Secondary | ICD-10-CM | POA: Diagnosis not present

## 2016-11-11 MED ORDER — CEPHALEXIN 500 MG PO CAPS: 500.0000 mg | ORAL_CAPSULE | Freq: Three times a day (TID) | ORAL | 0 refills | Status: DC

## 2016-11-11 NOTE — Progress Notes (Signed)
Subjective:     Patient ID: [name], female   DOB: 01-17-1946, 71 y.o.   MRN: [id number]  HPI Patient seen with pain left ring finger. She woke up with some soreness this morning. Slightly swollen and slightly red. Denies any injury. She is very concerned because of her prior history of splenectomy. She had episode of sepsis following dog bite couple years ago. Denies any fevers or chills. Allergic to penicillin but has taken cephalosporins in the past.  Past Medical History:  Diagnosis Date  . Cervicalgia 05/19/2009  . Headache(784.0) 05/19/2009  . NEOPLASM, MALIGNANT, OVARY, HX OF 06/30/2008  . SPRAIN AND STRAIN OF UNSPECIFIED SITE OF HAND 05/19/2009  . VIRAL URI 03/06/2009  . WEIGHT GAIN 06/30/2008   Past Surgical History:  Procedure Laterality Date  . ABDOMINAL HYSTERECTOMY    . APPENDECTOMY    . CESAREAN SECTION    . CHOLECYSTECTOMY  2011  . GASTRIC BYPASS  1979  . ovarian cancer  2007  . RENAL CALCULI    . SPLENECTOMY  2007  . TRANSURETHRAL RESECTION OF BLADDER  2007   LESION    reports that she has never smoked. She has never used smokeless tobacco. She reports that she does not drink alcohol or use drugs. family history includes Arthritis in her mother and other; Cancer in her sister; Cancer (age of onset: 39) in her father; Diabetes in her sister. Allergies  Allergen Reactions  . Morphine And Related Nausea And Vomiting  . Penicillins Hives     Review of Systems  Constitutional: Negative for chills and fever.       Objective:   Physical Exam  Constitutional: She appears well-developed and well-nourished.  Cardiovascular: Normal rate and regular rhythm.   Pulmonary/Chest: Effort normal and breath sounds normal. No respiratory distress. She has no wheezes. She has no rales.  Skin:  Left ring finger reveals small paronychia on the border of the nail laterally next to the fifth digit. Minimal erythema and swelling. Tender to palpation.       Assessment:      Small paronychia left ring finger. Patient with prior splenectomy history. Minimal cellulitis change.    Plan:     -We recommended using a needle and opening up the pocket of pus. We did not feel this was large enough to warrant digital block with incision. Using #25-gauge needle we were able to open up the skin and allow drainage of the purulence underneath. Patient tolerated well -Saltwater soaks several times daily next couple days -Printed prescription for Keflex 500 milligrams 3 times a day to start if she has any progressive swelling, redness, or warmth.  [name] MD [facility] at [facility]

## 2016-11-11 NOTE — Patient Instructions (Signed)
Warm salt water soaks several times daily Start the Keflex for any increased redness or warmth.

## 2016-12-01 ENCOUNTER — Other Ambulatory Visit: Payer: Self-pay | Admitting: Family Medicine

## 2016-12-02 NOTE — Telephone Encounter (Signed)
Refill OK

## 2016-12-02 NOTE — Telephone Encounter (Signed)
Last refill 06/01/16 and last office visit 11/11/16.  Okay to fill?

## 2016-12-19 ENCOUNTER — Ambulatory Visit (INDEPENDENT_AMBULATORY_CARE_PROVIDER_SITE_OTHER): Payer: Medicare Other

## 2016-12-19 DIAGNOSIS — Z23 Encounter for immunization: Secondary | ICD-10-CM

## 2016-12-26 ENCOUNTER — Encounter: Payer: Self-pay | Admitting: Family Medicine

## 2016-12-26 ENCOUNTER — Ambulatory Visit (INDEPENDENT_AMBULATORY_CARE_PROVIDER_SITE_OTHER): Payer: Medicare Other | Admitting: Family Medicine

## 2016-12-26 VITALS — BP 110/80 | HR 77 | Temp 98.0°F | Wt 177.5 lb

## 2016-12-26 DIAGNOSIS — F5104 Psychophysiologic insomnia: Secondary | ICD-10-CM

## 2016-12-26 MED ORDER — ZOLPIDEM TARTRATE 10 MG PO TABS: 10.0000 mg | ORAL_TABLET | Freq: Every day | ORAL | 1 refills | Status: DC

## 2016-12-26 NOTE — Patient Instructions (Signed)

## 2016-12-26 NOTE — Progress Notes (Signed)
Subjective:     Patient ID: [name], female   DOB: 03/02/1946, 71 y.o.   MRN: [id number]  HPI Patient here to discuss insomnia issues. She has history of breast cancer and following her chemotherapy several years ago had onset of difficulties falling asleep. She's been on Ambien for several years and this helps her to get to sleep. Still has some waking at night. Denies any depression issues. No late day use of caffeine or alcohol. She tried things like melatonin without success.  Past Medical History:  Diagnosis Date  . Cervicalgia 05/19/2009  . Headache(784.0) 05/19/2009  . NEOPLASM, MALIGNANT, OVARY, HX OF 06/30/2008  . SPRAIN AND STRAIN OF UNSPECIFIED SITE OF HAND 05/19/2009  . VIRAL URI 03/06/2009  . WEIGHT GAIN 06/30/2008   Past Surgical History:  Procedure Laterality Date  . ABDOMINAL HYSTERECTOMY    . APPENDECTOMY    . CESAREAN SECTION    . CHOLECYSTECTOMY  2011  . GASTRIC BYPASS  1979  . ovarian cancer  2007  . RENAL CALCULI    . SPLENECTOMY  2007  . TRANSURETHRAL RESECTION OF BLADDER  2007   LESION    reports that she has never smoked. She has never used smokeless tobacco. She reports that she does not drink alcohol or use drugs. family history includes Arthritis in her mother and other; Cancer in her sister; Cancer (age of onset: 13) in her father; Diabetes in her sister. Allergies  Allergen Reactions  . Morphine And Related Nausea And Vomiting  . Penicillins Hives     Review of Systems  Respiratory: Negative for shortness of breath.   Cardiovascular: Negative for chest pain.  Psychiatric/Behavioral: Negative for agitation, confusion and dysphoric mood.       Objective:   Physical Exam  Constitutional: She appears well-developed and well-nourished.  Cardiovascular: Normal rate and regular rhythm.   Pulmonary/Chest: Effort normal and breath sounds normal. No respiratory distress. She has no wheezes. She has no rales.  Psychiatric: She has a normal mood and  affect. Her behavior is normal.       Assessment:     Chronic insomnia    Plan:     -Sleep hygiene discussed with handout given -We discuss potential concerns with long-term use of Ambien but this has worked very well for her. We did discuss potential alternatives such as trazodone. We agreed to refill of Ambien 10 mg daily at bedtime #90 with one refill  [name] MD [facility] at [facility]

## 2017-01-03 DIAGNOSIS — M25562 Pain in left knee: Secondary | ICD-10-CM | POA: Diagnosis not present

## 2017-01-03 DIAGNOSIS — M1712 Unilateral primary osteoarthritis, left knee: Secondary | ICD-10-CM | POA: Diagnosis not present

## 2017-01-09 ENCOUNTER — Telehealth: Payer: Self-pay | Admitting: *Deleted

## 2017-01-09 NOTE — Telephone Encounter (Signed)
[employer] faxed a note stating they have not received a kit from the pt.  I called the pt and informed her of this and she stated she has hemorrhoids and is awaiting a good time to send the kit in; however she stated she will do so.

## 2017-03-27 DIAGNOSIS — N390 Urinary tract infection, site not specified: Secondary | ICD-10-CM | POA: Diagnosis not present

## 2017-03-27 DIAGNOSIS — N200 Calculus of kidney: Secondary | ICD-10-CM | POA: Diagnosis not present

## 2017-05-04 ENCOUNTER — Telehealth: Payer: Self-pay | Admitting: Family Medicine

## 2017-05-04 MED ORDER — OSELTAMIVIR PHOSPHATE 75 MG PO CAPS: 75.0000 mg | ORAL_CAPSULE | Freq: Every day | ORAL | 0 refills | Status: DC

## 2017-05-04 NOTE — Telephone Encounter (Signed)
Copied from [place]. Topic: Quick Communication - See Telephone Encounter >> May 04, 2017 10:00 AM Ahmed Prima L wrote: CRM for notification. See Telephone encounter for:   05/04/17. Patient states that she just took her grandson that lives with her to the dr and he has the flu. She wants to know could she have tamiflu as a preventative. Call back is [phone] [id number] [place], [place]

## 2017-05-04 NOTE — Telephone Encounter (Signed)
I spoke with pt and went over possible side effects of Tamiflu with pt. She did request script [facility]

## 2017-05-04 NOTE — Telephone Encounter (Signed)
Per Dr. Maudie Mercury okay to send in Tamilflu 75 mg take 1 every day for 10 days, go over possible side effects with pt.

## 2017-05-04 NOTE — Telephone Encounter (Signed)
I left a voice message for pt to return my call.  

## 2017-05-04 NOTE — Telephone Encounter (Signed)
The patient has received a letter from [facility] in reference to her zolpidem (AMBIEN) 10 MG tablet   Medicare requires that we cover at least 31 days supply when you're prescribed this drug.   If your doctor writes your prescription for fewer days, you may refill the drug until you've received at least 31 days supply.  Please send to: [id number] [address], [place] (Phone) [phone] (Fax)

## 2017-05-05 MED ORDER — ZOLPIDEM TARTRATE 10 MG PO TABS: 10.0000 mg | ORAL_TABLET | Freq: Every day | ORAL | 1 refills | Status: DC

## 2017-05-05 NOTE — Telephone Encounter (Signed)
Refill 6 months 

## 2017-05-05 NOTE — Telephone Encounter (Signed)
Last refill given at office visit 12/26/16.  Okay to fill?

## 2017-05-05 NOTE — Telephone Encounter (Signed)
rx called in

## 2017-05-10 NOTE — Telephone Encounter (Signed)
Pt is not able to have her zolpidem paid for by insurance.   Pt needs a letter of necessity about pre approval - [employer] Please call if more questions - [phone]

## 2017-05-10 NOTE — Telephone Encounter (Signed)
Patient called and said the  number to give pre authorization for zolpidem (AMBIEN) 10 MG tablet is [phone].

## 2017-05-15 NOTE — Telephone Encounter (Signed)
Prior auth sent to [id number].

## 2017-05-19 NOTE — Telephone Encounter (Signed)
Fax received from [place] stating the request was denied and given to Dr Burchette's asst.

## 2017-05-24 NOTE — Telephone Encounter (Signed)
Left message on machine for patient.  Ambien has been denied.  Patient should find out what is approved and a Rx may be sent in.  CRM created

## 2017-05-24 NOTE — Telephone Encounter (Signed)
Pt checking on the status of PA.

## 2017-05-26 ENCOUNTER — Encounter: Payer: Self-pay | Admitting: Family Medicine

## 2017-05-26 NOTE — Telephone Encounter (Signed)
Patient upset stating that knowone left a message on her machine they had spoken with her daughter and she states that the Doctor is suppose to write a letter to why she need the medicine and why she need this type if medicine she states that she would like to speak with Dr Elease Hashimoto assistant because she keep speaking with other people that don't know what there doing please call pt at home

## 2017-05-26 NOTE — Telephone Encounter (Signed)
Patient called back requesting to speak to [facility]. Please return call.

## 2017-05-26 NOTE — Telephone Encounter (Signed)
Spoke with patient.  Patient would like to know if Dr Elease Hashimoto will write a letter of appeal to get her Ambien.  The denial letter says she has to try either Belsmra or Rozerm and failed or is intolerable to these medications.    Fax number for letter is [phone].

## 2017-05-28 NOTE — Telephone Encounter (Signed)
Set up follow up so we can discuss.  I do not know if she has tried any of the alternatives.

## 2017-05-31 ENCOUNTER — Other Ambulatory Visit: Payer: Self-pay | Admitting: Obstetrics & Gynecology

## 2017-05-31 DIAGNOSIS — Z1231 Encounter for screening mammogram for malignant neoplasm of breast: Secondary | ICD-10-CM

## 2017-06-01 ENCOUNTER — Encounter: Payer: Self-pay | Admitting: Family Medicine

## 2017-06-02 NOTE — Telephone Encounter (Signed)
Letter and copy of denial letter faxed to [employer] Appeals [employer]rievance Dept at [phone].

## 2017-06-02 NOTE — Telephone Encounter (Signed)
Dr Elease Hashimoto wrote a letter.

## 2017-06-12 NOTE — Telephone Encounter (Signed)
Pt has received an approval letter from [facility] regarding the zolpidem (AMBIEN) 10 MG tablet. She is hoping this can now be called in to [id number] - [place], [place].

## 2017-06-13 ENCOUNTER — Telehealth: Payer: Self-pay | Admitting: Family Medicine

## 2017-06-13 MED ORDER — ZOLPIDEM TARTRATE 10 MG PO TABS: 10.0000 mg | ORAL_TABLET | Freq: Every day | ORAL | 1 refills | Status: DC

## 2017-06-13 NOTE — Telephone Encounter (Signed)
Refill for 6 months. 

## 2017-06-13 NOTE — Addendum Note (Signed)
Addended by: Westley Hummer B on: 06/13/2017 03:34 PM   Modules accepted: Orders

## 2017-06-13 NOTE — Telephone Encounter (Signed)
rx called in

## 2017-06-13 NOTE — Telephone Encounter (Signed)
Copied from [place]. Topic: Quick Communication - See Telephone Encounter >> Jun 13, 2017  3:04 PM Conception Chancy, NT wrote: CRM for notification. See Telephone encounter for:  06/13/17.  Patient states she has been waiting 3 weeks for her zolpidem (AMBIEN) 10 MG tablet   to be called in and get approved. She states it has been approved and would like to know why it has not been sent in. She states she called yesterday and it would be called in. I do not see anything documented from yesterday. Please advise  [id number] [address], [place] [address]  Phone: [phone] Fax: [phone]

## 2017-06-13 NOTE — Telephone Encounter (Signed)
Fax received from [facility] regarding the appeal in which it states the request was approved.  [facility] and left a detailed message on the voicemail with this info and the letter was sent to be scanned.  Patient sent Mychart message with this information.

## 2017-06-30 ENCOUNTER — Ambulatory Visit
Admission: RE | Admit: 2017-06-30 | Discharge: 2017-06-30 | Disposition: A | Discharge status: Home / Self Care | Payer: Medicare Other | Source: Ambulatory Visit | Attending: Obstetrics & Gynecology | Admitting: Obstetrics & Gynecology

## 2017-06-30 DIAGNOSIS — Z1231 Encounter for screening mammogram for malignant neoplasm of breast: Secondary | ICD-10-CM

## 2017-12-01 ENCOUNTER — Other Ambulatory Visit: Payer: Self-pay | Admitting: Family Medicine

## 2017-12-04 NOTE — Telephone Encounter (Signed)
Last refill 06/13/17 and last office visit 12/27/06.  Okay to fill?

## 2017-12-04 NOTE — Telephone Encounter (Signed)
Needs office follow-up.  Refill once 

## 2017-12-14 ENCOUNTER — Telehealth: Payer: Self-pay | Admitting: Family Medicine

## 2017-12-14 NOTE — Telephone Encounter (Signed)
Copied from [place] [phone]. Topic: General - Other >> Dec 14, 2017 10:52 AM Yvette Rack wrote: Reason for CRM: pt calling wanting to get the shingles injection

## 2017-12-15 ENCOUNTER — Ambulatory Visit (INDEPENDENT_AMBULATORY_CARE_PROVIDER_SITE_OTHER): Payer: Medicare Other | Admitting: *Deleted

## 2017-12-15 DIAGNOSIS — Z23 Encounter for immunization: Secondary | ICD-10-CM | POA: Diagnosis not present

## 2017-12-15 NOTE — Telephone Encounter (Signed)
Patient is aware that she will have to go to the pharmacy for her shingles vaccine

## 2018-01-01 ENCOUNTER — Ambulatory Visit: Payer: Medicare Other | Admitting: Obstetrics & Gynecology

## 2018-01-01 ENCOUNTER — Encounter: Payer: Self-pay | Admitting: Obstetrics & Gynecology

## 2018-01-01 VITALS — BP 128/86 | Ht 60.75 in | Wt 182.0 lb

## 2018-01-01 DIAGNOSIS — Z01419 Encounter for gynecological examination (general) (routine) without abnormal findings: Secondary | ICD-10-CM | POA: Diagnosis not present

## 2018-01-01 DIAGNOSIS — Z6834 Body mass index (BMI) 34.0-34.9, adult: Secondary | ICD-10-CM

## 2018-01-01 DIAGNOSIS — Z13820 Encounter for screening for osteoporosis: Secondary | ICD-10-CM | POA: Diagnosis not present

## 2018-01-01 DIAGNOSIS — Z9289 Personal history of other medical treatment: Secondary | ICD-10-CM

## 2018-01-01 DIAGNOSIS — Z780 Asymptomatic menopausal state: Secondary | ICD-10-CM | POA: Diagnosis not present

## 2018-01-01 DIAGNOSIS — Z1272 Encounter for screening for malignant neoplasm of vagina: Secondary | ICD-10-CM

## 2018-01-01 DIAGNOSIS — Z8543 Personal history of malignant neoplasm of ovary: Secondary | ICD-10-CM

## 2018-01-01 DIAGNOSIS — E6609 Other obesity due to excess calories: Secondary | ICD-10-CM

## 2018-01-01 NOTE — Patient Instructions (Signed)
1. Encounter for Papanicolaou smear of vagina as part of routine gynecological examination Gynecologic exam status post TAH/BSO in menopause.  Pap reflex done on the vaginal vault.  Breast exam normal.  Screening mammogram negative in April 2019.  Colonoscopy in 2009, schedule through family physician.  Health labs with family physician.  2. Postmenopausal Well on no hormone replacement therapy.  3. Screening for osteoporosis Recommend vitamin D supplements, calcium intake of 1.5 g/day and weightbearing physical activity.  Will schedule bone density here now. - DG Bone Density; Future  4. Class 1 obesity due to excess calories without serious comorbidity with body mass index (BMI) of 34.0 to 34.9 in adult Recommend low calorie/carb diet such as [employer].  Continue with current physical activity.  5. NEOPLASM, MALIGNANT, OVARY, HX OF Stage III ovarian cancer diagnosed in 2007.  No pelvic mass or cyst felt on pelvic exam today.  Will do annual screening with Ca1 25. - CA 125  Karenna, it was a pleasure seeing you today!  I will inform you of your results as soon as they are available.

## 2018-01-01 NOTE — Progress Notes (Signed)
[name] 1945/04/19 [id number]   History:    72 y.o. G3P2A1L2  Married.  Retired Probation officer  2 grand-children 22 and 2 yo.  RP:  Established patient presenting for annual gyn exam   HPI: Status post TAH/BSO staging, appendectomy, splenectomy with Dr. Fermin Schwab in 2007 for ovarian cancer stage III.  Chemotherapy after for 9 weeks.  Ca125 has been negative since then.  Will repeat today.  No pelvic pain.  Pap test negative in February 2018.  Breast normal.  BMI 34.67.  Active physically, but difficulty doing fitness because of Arthritis.  Health labs with family physician.    Past medical history,surgical history, family history and social history were all reviewed and documented in the EPIC chart.  Gynecologic History No LMP recorded. Patient has had a hysterectomy. Contraception: status post hysterectomy Last Pap: 04/2016. Results were: Negative Last mammogram: 06/2017. Results were: Negative Bone Density: 2014 Colonoscopy: 2009 negative  Obstetric History OB History  Gravida Para Term Preterm AB Living  3 2 2   1 2   SAB TAB Ectopic Multiple Live Births  1       2    # Outcome Date GA Lbr Len/2nd Weight Sex Delivery Anes PTL Lv  3 Term 1982 [redacted]w[redacted]d  6 lb 11 oz (3.033 kg) M CS-LTranv Spinal  LIV  2 Term 1976 [redacted]w[redacted]d  7 lb 9 oz (3.43 kg) F CS-LTranv Spinal  LIV  1 SAB         DEC     ROS: A ROS was performed and pertinent positives and negatives are included in the history.  GENERAL: No fevers or chills. HEENT: No change in vision, no earache, sore throat or sinus congestion. NECK: No pain or stiffness. CARDIOVASCULAR: No chest pain or pressure. No palpitations. PULMONARY: No shortness of breath, cough or wheeze. GASTROINTESTINAL: No abdominal pain, nausea, vomiting or diarrhea, melena or bright red blood per rectum. GENITOURINARY: No urinary frequency, urgency, hesitancy or dysuria. MUSCULOSKELETAL: No joint or muscle pain, no back pain, no recent trauma.  DERMATOLOGIC: No rash, no itching, no lesions. ENDOCRINE: No polyuria, polydipsia, no heat or cold intolerance. No recent change in weight. HEMATOLOGICAL: No anemia or easy bruising or bleeding. NEUROLOGIC: No headache, seizures, numbness, tingling or weakness. PSYCHIATRIC: No depression, no loss of interest in normal activity or change in sleep pattern.     Exam:   BP 128/86   Ht 5' 0.75" (1.543 m)   Wt 182 lb (82.6 kg)   BMI 34.67 kg/m   Body mass index is 34.67 kg/m.  General appearance : Well developed well nourished female. No acute distress HEENT: Eyes: no retinal hemorrhage or exudates,  Neck supple, trachea midline, no carotid bruits, no thyroidmegaly Lungs: Clear to auscultation, no rhonchi or wheezes, or rib retractions  Heart: Regular rate and rhythm, no murmurs or gallops Breast:Examined in sitting and supine position were symmetrical in appearance, no palpable masses or tenderness,  no skin retraction, no nipple inversion, no nipple discharge, no skin discoloration, no axillary or supraclavicular lymphadenopathy Abdomen: no palpable masses or tenderness, no rebound or guarding Extremities: no edema or skin discoloration or tenderness  Pelvic: Vulva: Normal             Vagina: No gross lesions or discharge.  Pap reflex done.  Cervix/Uterus absent  Adnexa  Without masses or tenderness  Anus: Normal   Assessment/Plan:  72 y.o. female for annual exam   1. Encounter for Papanicolaou smear of vagina as  part of routine gynecological examination Gynecologic exam status post TAH/BSO in menopause.  Pap reflex done on the vaginal vault.  Breast exam normal.  Screening mammogram negative in April 2019.  Colonoscopy in 2009, schedule through family physician.  Health labs with family physician.  2. Postmenopausal Well on no hormone replacement therapy.  3. Screening for osteoporosis Recommend vitamin D supplements, calcium intake of 1.5 g/day and weightbearing physical  activity.  Will schedule bone density here now. - DG Bone Density; Future  4. Class 1 obesity due to excess calories without serious comorbidity with body mass index (BMI) of 34.0 to 34.9 in adult Recommend low calorie/carb diet such as [employer].  Continue with current physical activity.  5. NEOPLASM, MALIGNANT, OVARY, HX OF Stage III ovarian cancer diagnosed in 2007.  No pelvic mass or cyst felt on pelvic exam today.  Will do annual screening with Ca1 25. - CA 125  Princess Bruins MD, 10:40 AM 01/01/2018

## 2018-01-01 NOTE — Addendum Note (Signed)
Addended by: Thurnell Garbe A on: 01/01/2018 11:59 AM   Modules accepted: Orders

## 2018-01-03 LAB — PAP IG W/ RFLX HPV ASCU

## 2018-01-03 LAB — CA 125: CA 125: 8 U/mL (ref <35)

## 2018-01-05 ENCOUNTER — Encounter: Payer: Self-pay | Admitting: *Deleted

## 2018-01-18 ENCOUNTER — Encounter: Payer: Medicare Other | Admitting: Obstetrics & Gynecology

## 2018-01-30 ENCOUNTER — Other Ambulatory Visit: Payer: Self-pay | Admitting: Obstetrics & Gynecology

## 2018-01-30 ENCOUNTER — Ambulatory Visit (INDEPENDENT_AMBULATORY_CARE_PROVIDER_SITE_OTHER): Payer: Medicare Other

## 2018-01-30 DIAGNOSIS — M810 Age-related osteoporosis without current pathological fracture: Secondary | ICD-10-CM

## 2018-01-30 DIAGNOSIS — Z13820 Encounter for screening for osteoporosis: Secondary | ICD-10-CM

## 2018-02-27 ENCOUNTER — Encounter: Payer: Medicare Other | Admitting: Obstetrics & Gynecology

## 2018-02-28 ENCOUNTER — Encounter: Payer: Self-pay | Admitting: Family Medicine

## 2018-03-12 ENCOUNTER — Ambulatory Visit: Payer: Medicare Other | Admitting: Family Medicine

## 2018-03-14 ENCOUNTER — Encounter: Payer: Self-pay | Admitting: Family Medicine

## 2018-03-14 ENCOUNTER — Ambulatory Visit (INDEPENDENT_AMBULATORY_CARE_PROVIDER_SITE_OTHER): Payer: Medicare Other | Admitting: Family Medicine

## 2018-03-14 ENCOUNTER — Telehealth: Payer: Self-pay

## 2018-03-14 ENCOUNTER — Other Ambulatory Visit: Payer: Self-pay

## 2018-03-14 VITALS — BP 110/68 | HR 73 | Temp 98.2°F | Ht 60.75 in | Wt 186.5 lb

## 2018-03-14 DIAGNOSIS — F5104 Psychophysiologic insomnia: Secondary | ICD-10-CM | POA: Diagnosis not present

## 2018-03-14 MED ORDER — ZOLPIDEM TARTRATE 10 MG PO TABS: 10.0000 mg | ORAL_TABLET | Freq: Every evening | ORAL | 1 refills | Status: DC | PRN

## 2018-03-14 NOTE — Telephone Encounter (Signed)
Called patient and let her know that I have received the form she faxed to Dr. Elease Hashimoto.  Placed form in red folder on Dr. Erick Blinks desk.

## 2018-03-14 NOTE — Patient Instructions (Signed)

## 2018-03-14 NOTE — Progress Notes (Signed)
  Subjective:     Patient ID: [name], female   DOB: 07-20-1945, 72 y.o.   MRN: [id number]  HPI Patient is seen for medical follow-up.  She has longstanding history of insomnia.  She went on Ambien around the time that she was treated for ovarian cancer about 15 years ago.  She has tremendous difficulty getting to sleep or staying asleep without it.  She has tried leaving off in the past.  She is tried multiple over-the-counter things such as melatonin without relief.  She is requesting letter for prior authorization to remain on that.  She does not use any alcohol and avoids caffeine.  She avoids bright lights at night.  She avoids eating before bedtime.  No regular daytime naps.  Past Medical History:  Diagnosis Date  . Cancer ([place]) 2007   ovarian Ca stage III  . Cervicalgia 05/19/2009  . Headache(784.0) 05/19/2009  . NEOPLASM, MALIGNANT, OVARY, HX OF 06/30/2008  . SPRAIN AND STRAIN OF UNSPECIFIED SITE OF HAND 05/19/2009  . VIRAL URI 03/06/2009  . WEIGHT GAIN 06/30/2008   Past Surgical History:  Procedure Laterality Date  . ABDOMINAL HYSTERECTOMY    . APPENDECTOMY    . CESAREAN SECTION     x2  . CHOLECYSTECTOMY  2011  . GASTRIC BYPASS  1979  . INGUINAL HERNIA REPAIR    . ovarian cancer  2007  . RENAL CALCULI    . SPLENECTOMY  2007  . TRANSURETHRAL RESECTION OF BLADDER  2007   LESION    reports that she has never smoked. She has never used smokeless tobacco. She reports that she does not drink alcohol or use drugs. family history includes Arthritis in her mother and another family member; Cancer in her sister; Cancer (age of onset: 15) in her father; Diabetes in her sister. Allergies  Allergen Reactions  . Morphine And Related Nausea And Vomiting  . Penicillins Hives     Review of Systems  Constitutional: Negative for fatigue.  Eyes: Negative for visual disturbance.  Respiratory: Negative for cough, chest tightness, shortness of breath and wheezing.   Cardiovascular:  Negative for chest pain, palpitations and leg swelling.  Neurological: Negative for dizziness, seizures, syncope, weakness, light-headedness and headaches.  Psychiatric/Behavioral: Positive for sleep disturbance.       Objective:   Physical Exam Constitutional:      Appearance: Normal appearance.  Cardiovascular:     Rate and Rhythm: Normal rate.     Comments: Occasional premature beat Pulmonary:     Effort: Pulmonary effort is normal.     Breath sounds: Normal breath sounds.  Neurological:     Mental Status: She is alert.  Psychiatric:        Mood and Affect: Mood normal.        Behavior: Behavior normal.        Assessment:     Chronic insomnia.    Plan:     -Sleep hygiene discussed. -We discussed other possible alternatives such as trazodone but this point she would like to try to get letter for prior authorization remain on Ambien.  We discussed risk of medication such as Ambien and Medicare population such as increased fall risk and possible memory impairment.  She is aware of these risk.  [name] MD [facility] at [facility]

## 2018-03-14 NOTE — Telephone Encounter (Signed)
Called patient and let her know that we still have not received the fax that she sent this morning. Patient stated that she will send fax again.  Copied from [place] [id number]. Topic: General - Other >> Mar 14, 2018 11:44 AM Alanda Slim E wrote: Reason for CRM: Pt called to see if fax came through For Dr. Elease Hashimoto. A paper with an dress that he neede to send a letter to this week. Pt stated she will call back to see if it was received. / please advise >> Mar 14, 2018 12:47 PM Windy Kalata wrote: Pt called again asking if the fax was received >> Mar 14, 2018 12:51 PM Virl Cagey, CMA wrote: Pt calling again asking if fax was received. Pt was advised By Dr Elease Hashimoto this morning to fax some paperwork over once she got home, pt has faxed this and is asking if it was received. Aware that Jinny Blossom is at lunch and they are in clinic this afternoon. Jinny Blossom is aware and will keep an eye out for the fax - we will call once this is received.

## 2018-03-15 ENCOUNTER — Encounter: Payer: Self-pay | Admitting: Family Medicine

## 2018-03-15 NOTE — Telephone Encounter (Signed)
Letter done.  See under letters tab

## 2018-03-16 ENCOUNTER — Telehealth: Payer: Self-pay

## 2018-03-16 NOTE — Telephone Encounter (Signed)
Called patient and spoke to her husband again as she had just left again. Husband stated to please mail the letter to her insurance company. I let husband know that I have faxed it but I will also mail a copy to the address provided on the paper that was faxed to [place].  Husband verbalized an understanding.  Copied from [place] [id number]. 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 I asked if patient wanted [place] to fax the letter to her insurance or to mail to them. Patient will call back to let me know and provide fax number if she wants this faxed. >> Mar 16, 2018 12:18 PM Ahmed Prima L wrote:  She would like it mailed.

## 2018-03-16 NOTE — Telephone Encounter (Signed)
Paperwork has been faxed to the number provided.

## 2018-03-16 NOTE — Telephone Encounter (Signed)
Called patient and spoke to her husband Dominica Severin and she is currently out. I asked if patient wanted [place] to fax the letter to her insurance or to mail to them. Patient will call back to let me know and provide fax number if she wants this faxed.  CRM Created.

## 2018-03-16 NOTE — Telephone Encounter (Signed)
Pt called back and states that she now doesn't want letter mailed she wants it faxed to [place] Fax number: [phone]

## 2018-04-20 ENCOUNTER — Telehealth: Payer: Self-pay

## 2018-04-20 NOTE — Telephone Encounter (Signed)
Patient is having healthcare send paperwork to be filled out for her Ambien and to please attach the letter when sending back. Keep an eye out.  Copied from [place] [id number]. Topic: General - Other >> Apr 20, 2018 12:24 PM Windy Kalata wrote: Reason for CRM: patient would like Dr. Erick Blinks nurse to call her, did not say what it was about   Best call back  is  [phone]

## 2018-04-26 NOTE — Telephone Encounter (Signed)
Pt would like for [name] to give her a call when she is back in the office. Please advise CB# [phone]

## 2018-04-26 NOTE — Telephone Encounter (Signed)
RN spoke with patient. Patient would like a return call from [facility] to see if Jinny Blossom did or did not receive the form from [facility] ([employer]).

## 2018-04-27 ENCOUNTER — Telehealth: Payer: Self-pay

## 2018-04-27 NOTE — Telephone Encounter (Signed)
Called patient and left a voice message to let her know that I still have not seen that paperwork yet.  Copied from [place] [phone]. Topic: General - Other >> Apr 27, 2018 12:59 PM Windy Kalata wrote: Reason for CRM: Patient called to speak to Ambrose [place] states that she is at lunch send a CRM  Best call back is [phone]

## 2018-04-30 NOTE — Telephone Encounter (Signed)
Called patient and gave her the old fax number of [phone] and she stated that she will call [employer] in the morning to see if they can fax again. Patient will call to confirm tomorrow.

## 2018-05-01 NOTE — Telephone Encounter (Signed)
° ° ° °  Pt ask that [name] call her back   [phone]

## 2018-05-02 ENCOUNTER — Telehealth: Payer: Self-pay | Admitting: Family Medicine

## 2018-05-02 NOTE — Telephone Encounter (Signed)
Authorization was denied for Ambien due to not receiving the letter Dr Elease Hashimoto wrote.  The pre-authorization letter that was previously sent from Dr Elease Hashimoto needs to be faxed to a new number- [phone]- also include the denial number as a reference [id number].  Write "Urgent" at top of the fax.  Patient is requesting a call back after 4:00 today.

## 2018-05-02 NOTE — Telephone Encounter (Signed)
Letter has been faxed to the number provided with the information on there also. Will call patient after 4pm today.

## 2018-05-02 NOTE — Telephone Encounter (Signed)
Called patient and gave let her know that I did receive her information and the fax confirmed that the paperwork went through. Patient verbalized an understanding and will call back if further information is needed.

## 2018-05-02 NOTE — Telephone Encounter (Signed)
Called patient and let her know that I have faxed the paperwork and that I did receive the confirmation fax that the paperwork went through. I also apologized for the delay in this process. Patient is so very nice and hopefully this will not happen in the future.

## 2018-05-02 NOTE — Telephone Encounter (Signed)
Pt called and asked to speak with Jinny Blossom about her prior auth for Ambien. States it is a lot of information that she needs to give and she would prefer to just speak with Jinny Blossom rather than leave a message.

## 2018-05-03 NOTE — Telephone Encounter (Signed)
Jocelyn Lamer with [facility] appeal dept called and said she is faxing over additional questions for the nurse to fill out.

## 2018-05-04 ENCOUNTER — Ambulatory Visit: Payer: Self-pay

## 2018-05-04 NOTE — Telephone Encounter (Signed)
Please see message. °

## 2018-05-04 NOTE — Telephone Encounter (Signed)
Paperwork placed in red folder on desk. Please return when complete. Thank you!

## 2018-05-04 NOTE — Telephone Encounter (Signed)
Phone call returned to Jeneen Rinks, with [facility] pharmacy.  Wanted to discuss Zolpidem with Dr. Erick Blinks medical assistant.  Reported, "per CMS, Zolpidem is high risk for pt. that is > 71 years of age."  Reported he would like to discuss Zolpidem further with Dr. Elease Hashimoto or medical assistant.  Jefferson Fuel, will send message to the office, and request that [facility] pharmacy be called back.  Verb. Understanding.      Message from Yvette Rack sent at 05/04/2018 11:46 AM EST   Jeneen Rinks from [place] [phone] calling stating that he has questions about the Ambien pt takes he need clinical information

## 2018-05-07 NOTE — Telephone Encounter (Signed)
Called patient and let her know that the appeal has been approved from message. Also faxed requested paperwork to [employer].

## 2018-05-07 NOTE — Telephone Encounter (Signed)
Do we still need to call since paperwork has been completed?

## 2018-05-07 NOTE — Telephone Encounter (Signed)
[facility] Night - Client Client Site [facility] - Night Physician Carolann Littler - MD Contact Type Call Who Is Calling Physician / Provider / Hospital Call Type Provider Call Message Only Reason for Call Request to send message to Office [place] from [employer] would like to inform office appeal has been approved. Additional Comment Pt [name] DOB December 26, 1945.

## 2018-06-05 ENCOUNTER — Other Ambulatory Visit: Payer: Self-pay

## 2018-06-05 ENCOUNTER — Encounter: Payer: Self-pay | Admitting: Family Medicine

## 2018-06-05 ENCOUNTER — Ambulatory Visit: Payer: Medicare Other | Admitting: Family Medicine

## 2018-06-05 VITALS — BP 112/74 | HR 124 | Temp 99.5°F | Ht 60.75 in | Wt 188.1 lb

## 2018-06-05 DIAGNOSIS — R52 Pain, unspecified: Secondary | ICD-10-CM

## 2018-06-05 DIAGNOSIS — R509 Fever, unspecified: Secondary | ICD-10-CM | POA: Diagnosis not present

## 2018-06-05 LAB — POCT INFLUENZA A/B
Influenza A, POC: NEGATIVE
Influenza B, POC: NEGATIVE

## 2018-06-05 MED ORDER — HYDROCODONE-HOMATROPINE 5-1.5 MG/5ML PO SYRP: 5.0000 mL | ORAL_SOLUTION | Freq: Four times a day (QID) | ORAL | 0 refills | Status: AC | PRN

## 2018-06-05 MED ORDER — OSELTAMIVIR PHOSPHATE 75 MG PO CAPS: 75.0000 mg | ORAL_CAPSULE | Freq: Two times a day (BID) | ORAL | 0 refills | Status: DC

## 2018-06-05 MED ORDER — ONDANSETRON 4 MG PO TBDP: 4.0000 mg | ORAL_TABLET | Freq: Three times a day (TID) | ORAL | 0 refills | Status: DC | PRN

## 2018-06-05 NOTE — Progress Notes (Signed)
  Subjective:     Patient ID: [name], female   DOB: [birth year], 73 y.o.   MRN: [id number]  HPI Patient seen with acute febrile illness.  Onset yesterday.  She states her grandson was diagnosed yesterday with influenza.  Patient has congestion, cough, fever, body aches.  She is had some mild nausea but no vomiting.  She is taken some Advil for fever.  She has had previous splenectomy.  No dysuria.  No abdominal pain.  No recent travels.  Past Medical History:  Diagnosis Date  . Cancer ([place]) 2007   ovarian Ca stage III  . Cervicalgia 05/19/2009  . Headache(784.0) 05/19/2009  . NEOPLASM, MALIGNANT, OVARY, HX OF 06/30/2008  . SPRAIN AND STRAIN OF UNSPECIFIED SITE OF HAND 05/19/2009  . VIRAL URI 03/06/2009  . WEIGHT GAIN 06/30/2008   Past Surgical History:  Procedure Laterality Date  . ABDOMINAL HYSTERECTOMY    . APPENDECTOMY    . CESAREAN SECTION     x2  . CHOLECYSTECTOMY  2011  . GASTRIC BYPASS  1979  . INGUINAL HERNIA REPAIR    . ovarian cancer  2007  . RENAL CALCULI    . SPLENECTOMY  2007  . TRANSURETHRAL RESECTION OF BLADDER  2007   LESION    reports that she has never smoked. She has never used smokeless tobacco. She reports that she does not drink alcohol or use drugs. family history includes Arthritis in her mother and another family member; Cancer in her sister; Cancer (age of onset: 8) in her father; Diabetes in her sister. Allergies  Allergen Reactions  . Morphine And Related Nausea And Vomiting  . Penicillins Hives     Review of Systems  Constitutional: Positive for chills, fatigue and fever.  HENT: Positive for congestion.   Respiratory: Positive for cough.   Gastrointestinal: Positive for nausea. Negative for abdominal pain, diarrhea and vomiting.  Genitourinary: Negative for dysuria.  Neurological: Positive for headaches.       Objective:   Physical Exam Constitutional:      Appearance: Normal appearance.  HENT:     Right Ear: Tympanic membrane  normal.     Left Ear: Tympanic membrane normal.     Mouth/Throat:     Mouth: Mucous membranes are moist.  Neck:     Musculoskeletal: Neck supple.  Cardiovascular:     Rate and Rhythm: Regular rhythm.  Pulmonary:     Effort: Pulmonary effort is normal. No respiratory distress.     Breath sounds: Normal breath sounds. No wheezing or rales.  Neurological:     Mental Status: She is alert.        Assessment:     Acute febrile illness.  Exposure to grandson yesterday who was diagnosed with flu.  Patient has increased risk because of her previous splenectomy and age.  Influenza screen negative    Plan:     -Even though this may just be a viral illness other than influenza because of exposure as above and increased risk factors we elected to go and cover with Tamiflu 75 mg twice daily for 5 days -Zofran 4 mg ODT 1 every 8 hours as needed for nausea -Refilled Hycodan cough syrup which she is taken the past 1 teaspoon nightly for severe cough -Plenty of fluids and rest and follow-up promptly for any worsening symptoms  [name] MD [facility]

## 2018-06-05 NOTE — Patient Instructions (Signed)
Plenty of fluids and rest  Follow up for any persistent or worsening symptoms.

## 2018-06-07 ENCOUNTER — Encounter: Payer: Self-pay | Admitting: Family Medicine

## 2018-09-25 ENCOUNTER — Other Ambulatory Visit: Payer: Self-pay | Admitting: Family Medicine

## 2018-09-25 NOTE — Telephone Encounter (Signed)
Last OV 06/05/18, No future OV   Last filled 03/14/18, # 90 with 1 refill

## 2018-10-09 ENCOUNTER — Encounter: Payer: Self-pay | Admitting: Family Medicine

## 2018-10-09 ENCOUNTER — Ambulatory Visit (INDEPENDENT_AMBULATORY_CARE_PROVIDER_SITE_OTHER): Payer: Medicare Other | Admitting: Family Medicine

## 2018-10-09 ENCOUNTER — Other Ambulatory Visit: Payer: Self-pay

## 2018-10-09 DIAGNOSIS — R000 Tachycardia, unspecified: Secondary | ICD-10-CM

## 2018-10-09 NOTE — Progress Notes (Signed)
Patient ID: [name], female   DOB: [birth year], 73 y.o.   MRN: [id number]  This visit type was conducted due to national recommendations for restrictions regarding the COVID-19 pandemic in an effort to limit this patient's exposure and mitigate transmission in our community.   Virtual Visit via Video Note  I connected with [name] on 10/09/18 at  3:45 PM EDT by a video enabled telemedicine application and verified that I am speaking with the correct person using two identifiers.  Location patient: home Location provider:work or home office Persons participating in the virtual visit: patient, provider  I discussed the limitations of evaluation and management by telemedicine and the availability of in person appointments. The patient expressed understanding and agreed to proceed.   HPI: Patient states that she had onset of some elevated pulse around last Friday.  She noted pulse on her pulse oximeter up as high as 153.  Oxygen levels been around 95%.  She feels like her pulse is fairly regular but definitely increased.  Sometimes this goes down to 120 range.  She has not had any recent fever.  No dizziness.  No chest pains.  No cough.  Mild dyspnea intermittently.  Minimal caffeine use.  No decongestant use.  Does not take any regular medications other than Ambien at night for sleep.  No recent labs.  No history of A. Fib.  Past Medical History:  Diagnosis Date  . Cancer ([place]) 2007   ovarian Ca stage III  . Cervicalgia 05/19/2009  . Headache(784.0) 05/19/2009  . NEOPLASM, MALIGNANT, OVARY, HX OF 06/30/2008  . SPRAIN AND STRAIN OF UNSPECIFIED SITE OF HAND 05/19/2009  . VIRAL URI 03/06/2009  . WEIGHT GAIN 06/30/2008   Past Surgical History:  Procedure Laterality Date  . ABDOMINAL HYSTERECTOMY    . APPENDECTOMY    . CESAREAN SECTION     x2  . CHOLECYSTECTOMY  2011  . GASTRIC BYPASS  1979  . INGUINAL HERNIA REPAIR    . ovarian cancer  2007  . RENAL CALCULI    . SPLENECTOMY   2007  . TRANSURETHRAL RESECTION OF BLADDER  2007   LESION    reports that she has never smoked. She has never used smokeless tobacco. She reports that she does not drink alcohol or use drugs. family history includes Arthritis in her mother and another family member; Cancer in her sister; Cancer (age of onset: 75) in her father; Diabetes in her sister. Allergies  Allergen Reactions  . Morphine And Related Nausea And Vomiting  . Penicillins Hives      ROS: See pertinent positives and negatives per HPI.  Past Medical History:  Diagnosis Date  . Cancer ([place]) 2007   ovarian Ca stage III  . Cervicalgia 05/19/2009  . Headache(784.0) 05/19/2009  . NEOPLASM, MALIGNANT, OVARY, HX OF 06/30/2008  . SPRAIN AND STRAIN OF UNSPECIFIED SITE OF HAND 05/19/2009  . VIRAL URI 03/06/2009  . WEIGHT GAIN 06/30/2008    Past Surgical History:  Procedure Laterality Date  . ABDOMINAL HYSTERECTOMY    . APPENDECTOMY    . CESAREAN SECTION     x2  . CHOLECYSTECTOMY  2011  . GASTRIC BYPASS  1979  . INGUINAL HERNIA REPAIR    . ovarian cancer  2007  . RENAL CALCULI    . SPLENECTOMY  2007  . TRANSURETHRAL RESECTION OF BLADDER  2007   LESION    Family History  Problem Relation Age of Onset  . Arthritis Mother        ?  osteoarthritis  . Cancer Father 48       lymphoma  . Arthritis Other   . Cancer Sister        ovaian cancer  . Diabetes Sister        type 2     SOCIAL HX: Non-smoker   Current Outpatient Medications:  .  calcium carbonate (OS-CAL - DOSED IN MG OF ELEMENTAL CALCIUM) 1250 MG tablet, Take 2 tablets by mouth daily. , Disp: , Rfl:  .  Multiple Vitamin (MULTIVITAMIN) tablet, Take 1 tablet by mouth daily. , Disp: , Rfl:  .  ondansetron (ZOFRAN ODT) 4 MG disintegrating tablet, Take 1 tablet (4 mg total) by mouth every 8 (eight) hours as needed for nausea or vomiting., Disp: 10 tablet, Rfl: 0 .  zolpidem (AMBIEN) 10 MG tablet, TAKE 1 TABLET BY MOUTH AT BEDTIME AS NEEDED, Disp: 90 tablet,  Rfl: 1  EXAM:  VITALS per patient if applicable:  GENERAL: alert, oriented, appears well and in no acute distress  HEENT: atraumatic, conjunttiva clear, no obvious abnormalities on inspection of external nose and ears  NECK: normal movements of the head and neck  LUNGS: on inspection no signs of respiratory distress, breathing rate appears normal, no obvious gross SOB, gasping or wheezing  CV: no obvious cyanosis  MS: moves all visible extremities without noticeable abnormality  PSYCH/NEURO: pleasant and cooperative, no obvious depression or anxiety, speech and thought processing grossly intact  ASSESSMENT AND PLAN:  Discussed the following assessment and plan:  Tachycardia by history.  New onset from a few days ago.  Patient minimally symptomatic.  Needs further evaluation.  Rule out new onset atrial fibrillation  -Patient will be scheduled for an office evaluation tomorrow for EKG, probable labs. -She knows to go to the ER before then if she develops any chest pain, increasing dyspnea, increased dizziness, or other concerns -Minimize caffeine use in the meantime    I discussed the assessment and treatment plan with the patient. The patient was provided an opportunity to ask questions and all were answered. The patient agreed with the plan and demonstrated an understanding of the instructions.   The patient was advised to call back or seek an in-person evaluation if the symptoms worsen or if the condition fails to improve as anticipated.   Carolann Littler, MD

## 2018-10-10 ENCOUNTER — Other Ambulatory Visit: Payer: Self-pay

## 2018-10-10 ENCOUNTER — Ambulatory Visit (INDEPENDENT_AMBULATORY_CARE_PROVIDER_SITE_OTHER): Payer: Medicare Other | Admitting: Family Medicine

## 2018-10-10 ENCOUNTER — Encounter: Payer: Self-pay | Admitting: Family Medicine

## 2018-10-10 VITALS — BP 148/84 | HR 144 | Temp 97.6°F | Ht 60.75 in | Wt 184.7 lb

## 2018-10-10 DIAGNOSIS — I4891 Unspecified atrial fibrillation: Secondary | ICD-10-CM | POA: Diagnosis not present

## 2018-10-10 DIAGNOSIS — R000 Tachycardia, unspecified: Secondary | ICD-10-CM | POA: Diagnosis not present

## 2018-10-10 LAB — COMPREHENSIVE METABOLIC PANEL
ALT: 17 U/L (ref 0–35)
AST: 22 U/L (ref 0–37)
Albumin: 4.1 g/dL (ref 3.5–5.2)
Alkaline Phosphatase: 88 U/L (ref 39–117)
BUN: 14 mg/dL (ref 6–23)
CO2: 27 mEq/L (ref 19–32)
Calcium: 9.3 mg/dL (ref 8.4–10.5)
Chloride: 107 mEq/L (ref 96–112)
Creatinine, Ser: 1.24 mg/dL — ABNORMAL HIGH (ref 0.40–1.20)
GFR: 42.61 mL/min — ABNORMAL LOW (ref >60.00)
Glucose, Bld: 91 mg/dL (ref 70–99)
Potassium: 4 mEq/L (ref 3.5–5.1)
Sodium: 143 mEq/L (ref 135–145)
Total Bilirubin: 0.7 mg/dL (ref 0.2–1.2)
Total Protein: 6.7 g/dL (ref 6.0–8.3)

## 2018-10-10 LAB — CBC WITH DIFFERENTIAL/PLATELET
Basophils Absolute: 0.1 10*3/uL (ref 0.0–0.1)
Basophils Relative: 1.1 % (ref 0.0–3.0)
Eosinophils Absolute: 0.3 10*3/uL (ref 0.0–0.7)
Eosinophils Relative: 3.8 % (ref 0.0–5.0)
HCT: 37.6 % (ref 36.0–46.0)
Hemoglobin: 12.5 g/dL (ref 12.0–15.0)
Lymphocytes Relative: 18.6 % (ref 12.0–46.0)
Lymphs Abs: 1.5 10*3/uL (ref 0.7–4.0)
MCHC: 33.3 g/dL (ref 30.0–36.0)
MCV: 95.8 fl (ref 78.0–100.0)
Monocytes Absolute: 0.8 10*3/uL (ref 0.1–1.0)
Monocytes Relative: 9.4 % (ref 3.0–12.0)
Neutro Abs: 5.4 10*3/uL (ref 1.4–7.7)
Neutrophils Relative %: 67.1 % (ref 43.0–77.0)
Platelets: 287 10*3/uL (ref 150.0–400.0)
RBC: 3.93 Mil/uL (ref 3.87–5.11)
RDW: 13.9 % (ref 11.5–15.5)
WBC: 8.1 10*3/uL (ref 4.0–10.5)

## 2018-10-10 LAB — TSH: TSH: 3.2 u[IU]/mL (ref 0.35–4.50)

## 2018-10-10 MED ORDER — APIXABAN 5 MG PO TABS: 5.0000 mg | ORAL_TABLET | Freq: Two times a day (BID) | ORAL | 1 refills | Status: DC

## 2018-10-10 MED ORDER — METOPROLOL SUCCINATE ER 50 MG PO TB24: 50.0000 mg | ORAL_TABLET | Freq: Every day | ORAL | 5 refills | Status: DC

## 2018-10-10 NOTE — Patient Instructions (Signed)
Atrial Fibrillation Atrial fibrillation is a type of irregular or rapid heartbeat (arrhythmia). In atrial fibrillation, the top part of the heart (atria) quivers in a chaotic pattern. This makes the heart unable to pump blood normally. Having atrial fibrillation can increase your risk for other health problems, such as:  Blood can pool in the atria and form clots. If a clot travels to the brain, it can cause a stroke.  The heart muscle may weaken from the irregular blood flow. This can cause heart failure. Atrial fibrillation may start suddenly and stop on its own, or it may become a long-lasting problem. What are the causes? This condition is caused by some heart-related conditions or procedures, including:  High blood pressure. This is the most common cause.  Heart failure.  Heart valve conditions.  Inflammation of the sac that surrounds the heart (pericarditis).  Heart surgery.  Coronary artery disease.  Certain heart rhythm disorders, such as Wolf-Parkinson-White syndrome. Other causes include:  Pneumonia.  Obstructive sleep apnea.  Lung cancer.  Thyroid problems, especially if the thyroid is overactive (hyperthyroidism).  Excessive alcohol or drug use. Sometimes, the cause of this condition is not known. What increases the risk? This condition is more likely to develop in:  Older people.  People who smoke.  People who have diabetes mellitus.  People who are overweight (obese).  Athletes who exercise vigorously.  People who have a family history. What are the signs or symptoms? Symptoms of this condition include:  A feeling that your heart is beating rapidly or irregularly.  A feeling of discomfort or pain in your chest.  Shortness of breath.  Sudden light-headedness or weakness.  Getting tired easily during exercise. In some cases, there are no symptoms. How is this diagnosed? Your health care provider may be able to detect atrial fibrillation when  taking your pulse. If detected, this condition may be diagnosed with:  Electrocardiogram (ECG).  Ambulatory cardiac monitor. This device records your heartbeats for 24 hours or more.  Transthoracic echocardiogram (TTE) to evaluate how blood flows through your heart.  Transesophageal echocardiogram (TEE) to view more detailed images of your heart.  A stress test.  Imaging tests, such as a CT scan or chest X-ray.  Blood tests. How is this treated? This condition may be treated with:  Medicines to slow down the heart rate or bring the heart's rhythm back to normal.  Medicines to prevent blood clots from forming.  Electrical cardioversion. This delivers a low-energy shock to the heart to reset its rhythm.  Ablation. This procedure destroys the part of the heart tissue that sends abnormal signals.  Left atrial appendage occlusion/excision. This seals off a common place in the atria where blood clots can form (left atrial appendage). The goal of treatment is to prevent blood clots from forming and to keep your heart beating at a normal rate and rhythm. Treatment depends on underlying medical conditions and how you feel when you are experiencing fibrillation. Follow these instructions at home: Medicines  Take over-the counter and prescription medicines only as told by your health care provider.  If your health care provider prescribed a blood-thinning medicine (anticoagulant), take it exactly as told. Taking too much blood-thinning medicine can cause bleeding. Taking too little can enable a blood clot to form and travel to the brain, causing a stroke. Lifestyle      Do not use any products that contain nicotine or tobacco, such as cigarettes and e-cigarettes. If you need help quitting, ask your health   care provider.  Do not drink beverages that contain caffeine, such as coffee, soda, and tea.  Follow diet instructions as told by your health care provider.  Exercise regularly as  told by your health care provider.  Do not drink alcohol. General instructions  If you have obstructive sleep apnea, manage your condition as told by your health care provider.  Maintain a healthy weight. Do not use diet pills unless your health care provider approves. Diet pills may make heart problems worse.  Keep all follow-up visits as told by your health care provider. This is important. Contact a health care provider if you:  Notice a change in the rate, rhythm, or strength of your heartbeat.  Are taking an anticoagulant and you notice increased bruising.  Tire more easily when you exercise or exert yourself.  Have a sudden change in weight. Get help right away if you have:   Chest pain, abdominal pain, sweating, or weakness.  Difficulty breathing.  Blood in your vomit, stool (feces), or urine.  Any symptoms of a stroke. "BE FAST" is an easy way to remember the main warning signs of a stroke: ? B - Balance. Signs are dizziness, sudden trouble walking, or loss of balance. ? E - Eyes. Signs are trouble seeing or a sudden change in vision. ? F - Face. Signs are sudden weakness or numbness of the face, or the face or eyelid drooping on one side. ? A - Arms. Signs are weakness or numbness in an arm. This happens suddenly and usually on one side of the body. ? S - Speech. Signs are sudden trouble speaking, slurred speech, or trouble understanding what people say. ? T - Time. Time to call emergency services. Write down what time symptoms started.  Other signs of a stroke, such as: ? A sudden, severe headache with no known cause. ? Nausea or vomiting. ? Seizure. These symptoms may represent a serious problem that is an emergency. Do not wait to see if the symptoms will go away. Get medical help right away. Call your local emergency services (911 in the [place] Do not drive yourself to the hospital. Summary  Atrial fibrillation is a type of irregular or rapid heartbeat  (arrhythmia).  Symptoms include a feeling that your heart is beating fast or irregularly. In some cases, you may not have symptoms.  The condition is treated with medicines to slow down the heart rate or bring the heart's rhythm back to normal. You may also need blood-thinning medicines to prevent blood clots.  Get help right away if you have symptoms or signs of a stroke. This information is not intended to replace advice given to you by your health care provider. Make sure you discuss any questions you have with your health care provider. Document Released: 03/14/2005 Document Revised: 05/04/2017 Document Reviewed: 05/05/2017 Elsevier Patient Education  2020 [employer]  

## 2018-10-10 NOTE — Progress Notes (Signed)
Subjective:     Patient ID: [name], female   DOB: 1945/11/18, 73 y.o.   MRN: [id number]  HPI Patient had called yesterday with onset sometime possibly last Thursday or Friday of palpitations and increased heart rate- noted on her home pulse oximeter.  We recommended office evaluation today.  Refer to note from yesterday for further details  Patient states that she had onset of some elevated pulse around last Friday.  She noted pulse on her pulse oximeter up as high as 153.  Oxygen levels been around 95%.  She feels like her pulse is fairly regular but definitely increased.  Sometimes this goes down to 120 range.  She has not had any recent fever.  No dizziness.  No chest pains.  No cough.  Mild dyspnea intermittently.  Minimal caffeine use.  No decongestant use.  Does not take any regular medications other than Ambien at night for sleep.  No recent labs.  No history of A. Fib.  No symptom changes since yesterday.  Surprisingly, she has not having any dizziness, fatigue, or significant dyspnea.  No recent chest pains.  She does not have any history of hypertension, diabetes, heart failure, or stroke. No recent fever.  No pleuritic pain.  No cough.  She had echocardiogram 2016 which showed EF 55 to 60%.  Mitral valve revealed mild regurgitation.  Left atrium mildly dilated  She has remote history of ovarian cancer.  Otherwise has been fairly healthy.  Past Medical History:  Diagnosis Date  . Cancer ([place]) 2007   ovarian Ca stage III  . Cervicalgia 05/19/2009  . Headache(784.0) 05/19/2009  . NEOPLASM, MALIGNANT, OVARY, HX OF 06/30/2008  . SPRAIN AND STRAIN OF UNSPECIFIED SITE OF HAND 05/19/2009  . VIRAL URI 03/06/2009  . WEIGHT GAIN 06/30/2008   Past Surgical History:  Procedure Laterality Date  . ABDOMINAL HYSTERECTOMY    . APPENDECTOMY    . CESAREAN SECTION     x2  . CHOLECYSTECTOMY  2011  . GASTRIC BYPASS  1979  . INGUINAL HERNIA REPAIR    . ovarian cancer  2007  . RENAL  CALCULI    . SPLENECTOMY  2007  . TRANSURETHRAL RESECTION OF BLADDER  2007   LESION    reports that she has never smoked. She has never used smokeless tobacco. She reports that she does not drink alcohol or use drugs. family history includes Arthritis in her mother and another family member; Cancer in her sister; Cancer (age of onset: 82) in her father; Diabetes in her sister. Allergies  Allergen Reactions  . Morphine And Related Nausea And Vomiting  . Penicillins Hives    Review of Systems  Constitutional: Negative for appetite change, chills, fever and unexpected weight change.  Respiratory: Negative for cough, shortness of breath and wheezing.   Cardiovascular: Negative for chest pain and palpitations.  Gastrointestinal: Negative for abdominal pain.  Genitourinary: Negative for dysuria.  Neurological: Negative for syncope.       Objective:   Physical Exam Constitutional:      Appearance: Normal appearance.  Neck:     Musculoskeletal: Neck supple.  Cardiovascular:     Comments: Patient has elevated heart rate around 140 with some irregularity Pulmonary:     Effort: Pulmonary effort is normal. No respiratory distress.     Breath sounds: Normal breath sounds. No rales.  Musculoskeletal:     Right lower leg: No edema.     Left lower leg: No edema.  Lymphadenopathy:  Cervical: No cervical adenopathy.  Neurological:     General: No focal deficit present.     Mental Status: She is alert and oriented to person, place, and time.     Cranial Nerves: No cranial nerve deficit.        Assessment:     New onset atrial fibrillation with rapid ventricular response.  Surprisingly, patient is relatively asymptomatic.  EKG confirms atrial fibrillation with rate around 138.  She has CHADSVASC2 score of 2 (AGE, FEMALE)    Plan:     -Start Toprol-XL 50 mg once daily -Start Eliquis 5 mg twice daily.  She does not have any known contraindications -Set up cardiology referral -We  will touch base with her tomorrow -Check labs with CBC, comprehensive metabolic panel, and TSH -Follow-up immediately or go to ER for any chest pain, shortness of breath, dizziness, or other concerns  [name] MD [facility]

## 2018-10-11 ENCOUNTER — Ambulatory Visit (INDEPENDENT_AMBULATORY_CARE_PROVIDER_SITE_OTHER): Payer: Medicare Other | Admitting: Family Medicine

## 2018-10-11 ENCOUNTER — Other Ambulatory Visit: Payer: Self-pay

## 2018-10-11 DIAGNOSIS — I4891 Unspecified atrial fibrillation: Secondary | ICD-10-CM

## 2018-10-11 NOTE — Progress Notes (Signed)
Patient ID: [name], female   DOB: 08/07/1945, 73 y.o.   MRN: [id number]  This visit type was conducted due to national recommendations for restrictions regarding the COVID-19 pandemic in an effort to limit this patient's exposure and mitigate transmission in our community.   Virtual Visit via Telephone Note  I connected with [name] on 10/11/18 at  9:00 AM EDT by telephone and verified that I am speaking with the correct person using two identifiers.   I discussed the limitations, risks, security and privacy concerns of performing an evaluation and management service by telephone and the availability of in person appointments. I also discussed with the patient that there may be a patient responsible charge related to this service. The patient expressed understanding and agreed to proceed.  Location patient: home Location provider: work or home office Participants present for the call: patient, provider Patient did not have a visit in the prior 7 days to address this/these issue(s).   History of Present Illness: We are following up with patient after visit yesterday.  She came in with tachyarrhythmia and EKG revealed atrial fibrillation with rapid ventricular response.  Patient was relatively asymptomatic though she had some mild dyspnea with lying supine.  We started Toprol-XL 50 mg daily and she states she feels much better this morning.  Her heart rate went from about 140 down to 103 to 115 for a high.  She denies any dyspnea.  She was also started on Eliquis 5 mg twice daily with a chadsvasc2 score of 2.  Her lab work was mostly unremarkable.  Creatinine was up slightly to 1.24.  Referral was made to cardiology and that is pending.  Patient has no history of cardiac issues, hypertension, diabetes, stroke, or heart failure.  Her blood pressure was up a bit yesterday at visit but no history of hypertension   Observations/Objective: Patient sounds cheerful and well on the  phone. I do not appreciate any SOB. Speech and thought processing are grossly intact. Patient reported vitals:  Assessment and Plan: New onset atrial fibrillation with rapid ventricular response.  Patient first noted symptoms of palpitations last Thursday  -Continue Eliquis 5 mg twice daily -Continue metoprolol XL 50 mg daily.  She will monitor pulse closely and if consistently over 100 by the weekend increase Toprol-XL to 75 mg daily -Cardiology appointment pending -Follow-up immediately for any dyspnea, chest pain, dizziness, or other concerns  Follow Up Instructions:  -As above   [id number] [id number] [id number] 11-20 [id number] 21-30 I did not refer this patient for an OV in the next 24 hours for this/these issue(s).  I discussed the assessment and treatment plan with the patient. The patient was provided an opportunity to ask questions and all were answered. The patient agreed with the plan and demonstrated an understanding of the instructions.   The patient was advised to call back or seek an in-person evaluation if the symptoms worsen or if the condition fails to improve as anticipated.  I provided 18 minutes of non-face-to-face time during this encounter.   Carolann Littler, MD

## 2018-10-15 ENCOUNTER — Encounter: Payer: Self-pay | Admitting: Family Medicine

## 2018-10-22 ENCOUNTER — Encounter: Payer: Self-pay | Admitting: Family Medicine

## 2018-11-05 ENCOUNTER — Encounter: Payer: Self-pay | Admitting: Family Medicine

## 2018-11-05 ENCOUNTER — Telehealth: Payer: Self-pay

## 2018-11-05 NOTE — Telephone Encounter (Signed)
I looked and patient has an appointment tomorrow at 3pm.  Copied from [place] [phone]. Topic: Appointment Scheduling - Scheduling Inquiry for Clinic >> Nov 05, 2018  2:02 PM Rainey Pines A wrote: Patient requesting in office appt in regards to mychart message on 11/05/2018. Attempted to reach office 3x

## 2018-11-06 ENCOUNTER — Encounter: Payer: Self-pay | Admitting: Family Medicine

## 2018-11-06 ENCOUNTER — Ambulatory Visit (INDEPENDENT_AMBULATORY_CARE_PROVIDER_SITE_OTHER): Payer: Medicare Other | Admitting: Family Medicine

## 2018-11-06 ENCOUNTER — Other Ambulatory Visit: Payer: Self-pay

## 2018-11-06 VITALS — BP 128/82 | HR 122 | Temp 97.9°F | Ht 60.75 in | Wt 180.0 lb

## 2018-11-06 DIAGNOSIS — R319 Hematuria, unspecified: Secondary | ICD-10-CM

## 2018-11-06 DIAGNOSIS — I4891 Unspecified atrial fibrillation: Secondary | ICD-10-CM | POA: Diagnosis not present

## 2018-11-06 DIAGNOSIS — I4819 Other persistent atrial fibrillation: Secondary | ICD-10-CM

## 2018-11-06 HISTORY — DX: Other persistent atrial fibrillation: I48.19

## 2018-11-06 LAB — POCT URINALYSIS DIPSTICK
Bilirubin, UA: NEGATIVE
Blood, UA: POSITIVE
Glucose, UA: NEGATIVE
Ketones, UA: NEGATIVE
Nitrite, UA: NEGATIVE
Protein, UA: POSITIVE — AB
Spec Grav, UA: 1.02 (ref 1.010–1.025)
Urobilinogen, UA: 0.2 E.U./dL
pH, UA: 6 (ref 5.0–8.0)

## 2018-11-06 MED ORDER — DILTIAZEM HCL ER COATED BEADS 120 MG PO CP24: 120.0000 mg | ORAL_CAPSULE | Freq: Every day | ORAL | 2 refills | Status: DC

## 2018-11-06 NOTE — Patient Instructions (Signed)
Hematuria, Adult Hematuria is blood in the urine. Blood may be visible in the urine, or it may be identified with a test. This condition can be caused by infections of the bladder, urethra, kidney, or prostate. Other possible causes include:  Kidney stones.  Cancer of the urinary tract.  Too much calcium in the urine.  Conditions that are passed from parent to child (inherited conditions).  Exercise that requires a lot of energy. Infections can usually be treated with medicine, and a kidney stone usually will pass through your urine. If neither of these is the cause of your hematuria, more tests may be needed to identify the cause of your symptoms. It is very important to tell your health care provider about any blood in your urine, even if it is painless or the blood stops without treatment. Blood in the urine, when it happens and then stops and then happens again, can be a symptom of a very serious condition, including cancer. There is no pain in the initial stages of many urinary cancers. Follow these instructions at home: Medicines  Take over-the-counter and prescription medicines only as told by your health care provider.  If you were prescribed an antibiotic medicine, take it as told by your health care provider. Do not stop taking the antibiotic even if you start to feel better. Eating and drinking  Drink enough fluid to keep your urine clear or pale yellow. It is recommended that you drink 3-4 quarts (2.8-3.8 L) a day. If you have been diagnosed with an infection, it is recommended that you drink cranberry juice in addition to large amounts of water.  Avoid caffeine, tea, and carbonated beverages. These tend to irritate the bladder.  Avoid alcohol because it may irritate the prostate (men). General instructions  If you have been diagnosed with a kidney stone, follow your health care provider's instructions about straining your urine to catch the stone.  Empty your bladder  often. Avoid holding urine for long periods of time.  If you are female: ? After a bowel movement, wipe from front to back and use each piece of toilet paper only once. ? Empty your bladder before and after sex.  Pay attention to any changes in your symptoms. Tell your health care provider about any changes or any new symptoms.  It is your responsibility to get your test results. Ask your health care provider, or the department performing the test, when your results will be ready.  Keep all follow-up visits as told by your health care provider. This is important. Contact a health care provider if:  You develop back pain.  You have a fever.  You have nausea or vomiting.  Your symptoms do not improve after 3 days.  Your symptoms get worse. Get help right away if:  You develop severe vomiting and are unable take medicine without vomiting.  You develop severe pain in your back or abdomen even though you are taking medicine.  You pass a large amount of blood in your urine.  You pass blood clots in your urine.  You feel very weak or like you might faint.  You faint. Summary  Hematuria is blood in the urine. It has many possible causes.  It is very important that you tell your health care provider about any blood in your urine, even if it is painless or the blood stops without treatment.  Take over-the-counter and prescription medicines only as told by your health care provider.  Drink enough fluid to keep   your urine clear or pale yellow. This information is not intended to replace advice given to you by your health care provider. Make sure you discuss any questions you have with your health care provider. Document Released: 03/14/2005 Document Revised: 02/24/2017 Document Reviewed: 04/16/2016 Elsevier Patient Education  2020 [employer].

## 2018-11-06 NOTE — Progress Notes (Signed)
Subjective:     Patient ID: [name], female   DOB: January 27, 1946, 73 y.o.   MRN: [id number]  HPI Patient called recently and stating that during the 24-hour period she has had usually at least one episode of urination with dark reddish brown-colored urine.  That started sometime around August 7.  Most of her episodes of urination are clear.  She has not had any recent burning with urination and no recent fever.  She was recently started on Eliquis for new onset atrial fibrillation.  She denies any past history of hematuria.  No recent flank pain.  Does have past history of kidney stones but no abdominal pain issues.  Not aware of any vaginal spotting or bleeding  She is otherwise tolerating Eliquis without difficulty.  She had rapid ventricular response and was started on Toprol-XL and is currently titrated up to 100 mg daily was still has frequent pulse up sometimes as high as 120s.  She has pending follow-up with cardiology September 15.  Recent TSH was normal range.  Her blood pressures been stable  Past Medical History:  Diagnosis Date  . Cancer ([place]) 2007   ovarian Ca stage III  . Cervicalgia 05/19/2009  . Headache(784.0) 05/19/2009  . NEOPLASM, MALIGNANT, OVARY, HX OF 06/30/2008  . SPRAIN AND STRAIN OF UNSPECIFIED SITE OF HAND 05/19/2009  . VIRAL URI 03/06/2009  . WEIGHT GAIN 06/30/2008   Past Surgical History:  Procedure Laterality Date  . ABDOMINAL HYSTERECTOMY    . APPENDECTOMY    . CESAREAN SECTION     x2  . CHOLECYSTECTOMY  2011  . GASTRIC BYPASS  1979  . INGUINAL HERNIA REPAIR    . ovarian cancer  2007  . RENAL CALCULI    . SPLENECTOMY  2007  . TRANSURETHRAL RESECTION OF BLADDER  2007   LESION    reports that she has never smoked. She has never used smokeless tobacco. She reports that she does not drink alcohol or use drugs. family history includes Arthritis in her mother and another family member; Cancer in her sister; Cancer (age of onset: 2) in her father; Diabetes  in her sister. Allergies  Allergen Reactions  . Morphine And Related Nausea And Vomiting  . Penicillins Hives     Review of Systems  Constitutional: Negative for chills and fever.  Respiratory: Negative for shortness of breath.   Cardiovascular: Negative for chest pain.  Gastrointestinal: Negative for abdominal pain and blood in stool.  Genitourinary: Positive for hematuria. Negative for difficulty urinating, dysuria, flank pain and vaginal bleeding.  Neurological: Negative for dizziness.       Objective:   Physical Exam Constitutional:      Appearance: Normal appearance.  Cardiovascular:     Comments: Irregular rhythm with rate around 115 Pulmonary:     Effort: Pulmonary effort is normal.     Breath sounds: Normal breath sounds. No wheezing or rales.  Musculoskeletal:     Right lower leg: No edema.     Left lower leg: No edema.  Neurological:     Mental Status: She is alert.        Assessment:     #1 hematuria in a patient on anticoagulation with Eliquis.  Urine dipstick today shows 3+ blood along with 1+ leukocytes.  She denies any symptoms of UTI.  #2 recent onset atrial fibrillation with rapid ventricular response.  She still has elevated pulse in spite of Toprol-XL 100 mg daily.  Recent TSH normal    Plan:     -  Urine culture and urine microscopy sent -Add low-dose Cardizem CD 120 mg once daily to her metoprolol for hopefully better rate control -May need urology referral for further evaluation of her hematuria depending on results above  [name] MD [facility]

## 2018-11-07 LAB — URINALYSIS, MICROSCOPIC ONLY

## 2018-11-08 LAB — URINE CULTURE
MICRO NUMBER:: 759740
SPECIMEN QUALITY:: ADEQUATE

## 2018-11-09 ENCOUNTER — Other Ambulatory Visit: Payer: Self-pay

## 2018-11-09 ENCOUNTER — Encounter: Payer: Self-pay | Admitting: Family Medicine

## 2018-11-09 MED ORDER — CEPHALEXIN 500 MG PO CAPS: 500.0000 mg | ORAL_CAPSULE | Freq: Four times a day (QID) | ORAL | 0 refills | Status: DC

## 2018-11-14 ENCOUNTER — Encounter: Payer: Self-pay | Admitting: Family Medicine

## 2018-11-23 ENCOUNTER — Encounter: Payer: Self-pay | Admitting: Family Medicine

## 2018-11-23 ENCOUNTER — Ambulatory Visit (INDEPENDENT_AMBULATORY_CARE_PROVIDER_SITE_OTHER): Payer: Medicare Other | Admitting: Family Medicine

## 2018-11-23 ENCOUNTER — Other Ambulatory Visit: Payer: Self-pay

## 2018-11-23 VITALS — BP 128/78 | HR 76 | Temp 97.9°F | Ht 60.75 in | Wt 182.9 lb

## 2018-11-23 DIAGNOSIS — I4891 Unspecified atrial fibrillation: Secondary | ICD-10-CM

## 2018-11-23 DIAGNOSIS — R319 Hematuria, unspecified: Secondary | ICD-10-CM

## 2018-11-23 DIAGNOSIS — Z23 Encounter for immunization: Secondary | ICD-10-CM | POA: Diagnosis not present

## 2018-11-23 LAB — POCT URINALYSIS DIPSTICK
Bilirubin, UA: NEGATIVE
Blood, UA: NEGATIVE
Glucose, UA: NEGATIVE
Ketones, UA: NEGATIVE
Leukocytes, UA: NEGATIVE
Nitrite, UA: NEGATIVE
Protein, UA: NEGATIVE
Spec Grav, UA: 1.01 (ref 1.010–1.025)
Urobilinogen, UA: 0.2 E.U./dL
pH, UA: 6 (ref 5.0–8.0)

## 2018-11-23 MED ORDER — METOPROLOL SUCCINATE ER 100 MG PO TB24: 100.0000 mg | ORAL_TABLET | Freq: Every day | ORAL | 3 refills | Status: DC

## 2018-11-23 NOTE — Patient Instructions (Signed)
Atrial Fibrillation Atrial fibrillation is a type of irregular or rapid heartbeat (arrhythmia). In atrial fibrillation, the top part of the heart (atria) quivers in a chaotic pattern. This makes the heart unable to pump blood normally. Having atrial fibrillation can increase your risk for other health problems, such as:  Blood can pool in the atria and form clots. If a clot travels to the brain, it can cause a stroke.  The heart muscle may weaken from the irregular blood flow. This can cause heart failure. Atrial fibrillation may start suddenly and stop on its own, or it may become a long-lasting problem. What are the causes? This condition is caused by some heart-related conditions or procedures, including:  High blood pressure. This is the most common cause.  Heart failure.  Heart valve conditions.  Inflammation of the sac that surrounds the heart (pericarditis).  Heart surgery.  Coronary artery disease.  Certain heart rhythm disorders, such as Wolf-Parkinson-White syndrome. Other causes include:  Pneumonia.  Obstructive sleep apnea.  Lung cancer.  Thyroid problems, especially if the thyroid is overactive (hyperthyroidism).  Excessive alcohol or drug use. Sometimes, the cause of this condition is not known. What increases the risk? This condition is more likely to develop in:  Older people.  People who smoke.  People who have diabetes mellitus.  People who are overweight (obese).  Athletes who exercise vigorously.  People who have a family history. What are the signs or symptoms? Symptoms of this condition include:  A feeling that your heart is beating rapidly or irregularly.  A feeling of discomfort or pain in your chest.  Shortness of breath.  Sudden light-headedness or weakness.  Getting tired easily during exercise. In some cases, there are no symptoms. How is this diagnosed? Your health care provider may be able to detect atrial fibrillation when  taking your pulse. If detected, this condition may be diagnosed with:  Electrocardiogram (ECG).  Ambulatory cardiac monitor. This device records your heartbeats for 24 hours or more.  Transthoracic echocardiogram (TTE) to evaluate how blood flows through your heart.  Transesophageal echocardiogram (TEE) to view more detailed images of your heart.  A stress test.  Imaging tests, such as a CT scan or chest X-ray.  Blood tests. How is this treated? This condition may be treated with:  Medicines to slow down the heart rate or bring the heart's rhythm back to normal.  Medicines to prevent blood clots from forming.  Electrical cardioversion. This delivers a low-energy shock to the heart to reset its rhythm.  Ablation. This procedure destroys the part of the heart tissue that sends abnormal signals.  Left atrial appendage occlusion/excision. This seals off a common place in the atria where blood clots can form (left atrial appendage). The goal of treatment is to prevent blood clots from forming and to keep your heart beating at a normal rate and rhythm. Treatment depends on underlying medical conditions and how you feel when you are experiencing fibrillation. Follow these instructions at home: Medicines  Take over-the counter and prescription medicines only as told by your health care provider.  If your health care provider prescribed a blood-thinning medicine (anticoagulant), take it exactly as told. Taking too much blood-thinning medicine can cause bleeding. Taking too little can enable a blood clot to form and travel to the brain, causing a stroke. Lifestyle      Do not use any products that contain nicotine or tobacco, such as cigarettes and e-cigarettes. If you need help quitting, ask your health   care provider.  Do not drink beverages that contain caffeine, such as coffee, soda, and tea.  Follow diet instructions as told by your health care provider.  Exercise regularly as  told by your health care provider.  Do not drink alcohol. General instructions  If you have obstructive sleep apnea, manage your condition as told by your health care provider.  Maintain a healthy weight. Do not use diet pills unless your health care provider approves. Diet pills may make heart problems worse.  Keep all follow-up visits as told by your health care provider. This is important. Contact a health care provider if you:  Notice a change in the rate, rhythm, or strength of your heartbeat.  Are taking an anticoagulant and you notice increased bruising.  Tire more easily when you exercise or exert yourself.  Have a sudden change in weight. Get help right away if you have:   Chest pain, abdominal pain, sweating, or weakness.  Difficulty breathing.  Blood in your vomit, stool (feces), or urine.  Any symptoms of a stroke. "BE FAST" is an easy way to remember the main warning signs of a stroke: ? B - Balance. Signs are dizziness, sudden trouble walking, or loss of balance. ? E - Eyes. Signs are trouble seeing or a sudden change in vision. ? F - Face. Signs are sudden weakness or numbness of the face, or the face or eyelid drooping on one side. ? A - Arms. Signs are weakness or numbness in an arm. This happens suddenly and usually on one side of the body. ? S - Speech. Signs are sudden trouble speaking, slurred speech, or trouble understanding what people say. ? T - Time. Time to call emergency services. Write down what time symptoms started.  Other signs of a stroke, such as: ? A sudden, severe headache with no known cause. ? Nausea or vomiting. ? Seizure. These symptoms may represent a serious problem that is an emergency. Do not wait to see if the symptoms will go away. Get medical help right away. Call your local emergency services (911 in the [place] Do not drive yourself to the hospital. Summary  Atrial fibrillation is a type of irregular or rapid heartbeat  (arrhythmia).  Symptoms include a feeling that your heart is beating fast or irregularly. In some cases, you may not have symptoms.  The condition is treated with medicines to slow down the heart rate or bring the heart's rhythm back to normal. You may also need blood-thinning medicines to prevent blood clots.  Get help right away if you have symptoms or signs of a stroke. This information is not intended to replace advice given to you by your health care provider. Make sure you discuss any questions you have with your health care provider. Document Released: 03/14/2005 Document Revised: 05/04/2017 Document Reviewed: 05/05/2017 Elsevier Patient Education  2020 [employer]  

## 2018-11-23 NOTE — Addendum Note (Signed)
Addended by: Anibal Henderson on: 11/23/2018 01:27 PM   Modules accepted: Orders

## 2018-11-23 NOTE — Progress Notes (Signed)
Subjective:     Patient ID: [name], female   DOB: 05/23/1945, 73 y.o.   MRN: [id number]  HPI Patient is seen in follow-up regarding recent diagnosis of A. fib and recent UTI.  She presented to the office on 10/10/2018 with new onset A. fib with rapid ventricular response.  She was minimally symptomatic.  Chads vas score of 2.  We started Eliquis and Toprol-XL 50 mg daily.  Her lab work was unremarkable.  Her pulse remained elevated and we titrated her Toprol to 100 mg.  We have since added diltiazem 120 mg daily and her pulse is finally down now.  She remains on Eliquis.  She has no dizziness or chest pains at this time.  She has pending follow-up with cardiology in September  She then presented here on 11/06/2026 with some hematuria.   We sent culture which grew out E. coli.  She was treated with Keflex.  Denies any urinary symptoms at this time.  After taking the Keflex promptly cleared.    Past Medical History:  Diagnosis Date  . Cancer ([place]) 2007   ovarian Ca stage III  . Cervicalgia 05/19/2009  . Headache(784.0) 05/19/2009  . NEOPLASM, MALIGNANT, OVARY, HX OF 06/30/2008  . SPRAIN AND STRAIN OF UNSPECIFIED SITE OF HAND 05/19/2009  . VIRAL URI 03/06/2009  . WEIGHT GAIN 06/30/2008   Past Surgical History:  Procedure Laterality Date  . ABDOMINAL HYSTERECTOMY    . APPENDECTOMY    . CESAREAN SECTION     x2  . CHOLECYSTECTOMY  2011  . GASTRIC BYPASS  1979  . INGUINAL HERNIA REPAIR    . ovarian cancer  2007  . RENAL CALCULI    . SPLENECTOMY  2007  . TRANSURETHRAL RESECTION OF BLADDER  2007   LESION    reports that she has never smoked. She has never used smokeless tobacco. She reports that she does not drink alcohol or use drugs. family history includes Arthritis in her mother and another family member; Cancer in her sister; Cancer (age of onset: 17) in her father; Diabetes in her sister. Allergies  Allergen Reactions  . Morphine And Related Nausea And Vomiting  . Penicillins  Hives      Review of Systems  Constitutional: Negative for fatigue and unexpected weight change.  Eyes: Negative for visual disturbance.  Respiratory: Negative for cough, chest tightness, shortness of breath and wheezing.   Cardiovascular: Negative for chest pain, palpitations and leg swelling.  Gastrointestinal: Negative for abdominal pain.  Genitourinary: Negative for dysuria, flank pain and hematuria.  Neurological: Negative for dizziness, seizures, syncope, weakness, light-headedness and headaches.       Objective:   Physical Exam Constitutional:      Appearance: Normal appearance.  Cardiovascular:     Rate and Rhythm: Normal rate.     Comments: Irregular rhythm but improved rate Pulmonary:     Effort: Pulmonary effort is normal.     Breath sounds: Normal breath sounds. No wheezing or rales.  Musculoskeletal:     Right lower leg: No edema.     Left lower leg: No edema.  Neurological:     Mental Status: She is alert.        Assessment:     #1 recent UTI with E. coli cleared with Keflex.  Follow-up urine dipstick today is normal  #2 recent gross hematuria likely related to #1.  Hematuria cleared with treatment of UTI  #3 recent diagnosis of new onset A. fib with rapid ventricular  response.  Heart rate improved on combination therapy with Toprol and diltiazem.  She mains on Eliquis for anticoagulation    Plan:     -Flu vaccine given -Patient has pending follow-up with cardiology in September -Continue current medication regimen -Follow-up promptly for any recurrent urinary symptoms or other concerns  [name] MD [facility]

## 2018-12-02 ENCOUNTER — Other Ambulatory Visit: Payer: Self-pay | Admitting: Family Medicine

## 2018-12-10 NOTE — H&P (View-Only) (Signed)
Cardiology Office Note:    Date:  12/11/2018   ID:  [name], DOB 1945-11-10, MRN [id number]  PCP:  Christina Post, MD  Cardiologist:   [name] Electrophysiologist:  None   Referring MD: Christina Post, MD   Chief Complaint  Patient presents with  . Atrial Fibrillation    Sept. 15, 2020   [name] is a 73 y.o. female with a recent diagnosis of atrial fib with RVR . We were asked to see her today by Dr. Elease Hashimoto for further evaluation of her atrial fib.   Cannot tell when she is in atrial fib or now She had AF with RVR  She was found to have AF RVR.    She found her rapid HR when she put on a pulse oxymeter and found her HR to be 160 .  Noticed palpitations when she was busy taking care of family who was visiting her from out of town.  Is also on Eliquis .  She is feeling better.   Is an ovarian cancer surviver.   Has neuropathy from chemo .    Past Medical History:  Diagnosis Date  . Cancer ([place]) 2007   ovarian Ca stage III  . Cervicalgia 05/19/2009  . Headache(784.0) 05/19/2009  . NEOPLASM, MALIGNANT, OVARY, HX OF 06/30/2008  . SPRAIN AND STRAIN OF UNSPECIFIED SITE OF HAND 05/19/2009  . VIRAL URI 03/06/2009  . WEIGHT GAIN 06/30/2008    Past Surgical History:  Procedure Laterality Date  . ABDOMINAL HYSTERECTOMY    . APPENDECTOMY    . CESAREAN SECTION     x2  . CHOLECYSTECTOMY  2011  . GASTRIC BYPASS  1979  . INGUINAL HERNIA REPAIR    . ovarian cancer  2007  . RENAL CALCULI    . SPLENECTOMY  2007  . TRANSURETHRAL RESECTION OF BLADDER  2007   LESION    Current Medications: Current Meds  Medication Sig  . calcium carbonate (OS-CAL - DOSED IN MG OF ELEMENTAL CALCIUM) 1250 MG tablet Take 1 tablet by mouth daily.   Marland Kitchen diltiazem (CARDIZEM CD) 120 MG 24 hr capsule Take 1 capsule (120 mg total) by mouth daily.  Marland Kitchen ELIQUIS 5 MG TABS tablet TAKE 1 TABLET BY MOUTH TWICE A DAY  . metoprolol succinate (TOPROL-XL) 100 MG 24 hr tablet Take 1 tablet  (100 mg total) by mouth daily. Take with or immediately following a meal.  . Multiple Vitamin (MULTIVITAMIN) tablet Take 1 tablet by mouth daily.   . ondansetron (ZOFRAN ODT) 4 MG disintegrating tablet Take 1 tablet (4 mg total) by mouth every 8 (eight) hours as needed for nausea or vomiting.  Marland Kitchen zolpidem (AMBIEN) 10 MG tablet TAKE 1 TABLET BY MOUTH AT BEDTIME AS NEEDED     Allergies:   Morphine and related and Penicillins   Social History   Socioeconomic History  . Marital status: Married    Spouse name: Not on file  . Number of children: Not on file  . Years of education: Not on file  . Highest education level: Not on file  Occupational History  . Not on file  Social Needs  . Financial resource strain: Not on file  . Food insecurity    Worry: Not on file    Inability: Not on file  . Transportation needs    Medical: Not on file    Non-medical: Not on file  Tobacco Use  . Smoking status: Never Smoker  . Smokeless tobacco: Never Used  Substance and Sexual Activity  . Alcohol use: No  . Drug use: No  . Sexual activity: Yes    Partners: Male    Comment: 1ST intercourse- 83, partners- 23, married- 17 yrs   Lifestyle  . Physical activity    Days per week: Not on file    Minutes per session: Not on file  . Stress: Not on file  Relationships  . Social Herbalist on phone: Not on file    Gets together: Not on file    Attends religious service: Not on file    Active member of club or organization: Not on file    Attends meetings of clubs or organizations: Not on file    Relationship status: Not on file  Other Topics Concern  . Not on file  Social History Narrative  . Not on file     Family History: The patient's family history includes Arthritis in her mother and another family member; Cancer in her sister; Cancer (age of onset: 73) in her father; Diabetes in her sister.  ROS:   Please see the history of present illness.     All other systems reviewed and are  negative.  EKGs/Labs/Other Studies Reviewed:    The following studies were reviewed today:   EKG:  October 10, 2018:   Afib at 138.   Recent Labs: 10/10/2018: ALT 17; BUN 14; Creatinine, Ser 1.24; Hemoglobin 12.5; Platelets 287.0; Potassium 4.0; Sodium 143; TSH 3.20  Recent Lipid Panel    Component Value Date/Time   CHOL 148 12/23/2011 0922   TRIG 54.0 12/23/2011 0922   HDL 54.70 12/23/2011 0922   CHOLHDL 3 12/23/2011 0922   VLDL 10.8 12/23/2011 0922   LDLCALC 83 12/23/2011 0922    Physical Exam:    VS:  BP 102/74   Pulse 85   Ht 5\' 2"  (1.575 m)   Wt 182 lb 12.8 oz (82.9 kg)   SpO2 98%   BMI 33.43 kg/m     Wt Readings from Last 3 Encounters:  12/11/18 182 lb 12.8 oz (82.9 kg)  11/23/18 182 lb 14.4 oz (83 kg)  11/06/18 180 lb (81.6 kg)     GEN:  Elderly female,  Mild obesity  HEENT: Normal NECK: No JVD; No carotid bruits LYMPHATICS: No lymphadenopathy CARDIAC:  Irreg.   irreg RESPIRATORY:  Clear to auscultation without rales, wheezing or rhonchi  ABDOMEN: Soft, non-tender, non-distended MUSCULOSKELETAL:  No edema; No deformity  SKIN: Warm and dry NEUROLOGIC:  Alert and oriented x 3 PSYCHIATRIC:  Normal affect   ASSESSMENT:    1. Atrial fibrillation with rapid ventricular response (HCC)    PLAN:    In order of problems listed above:  1. Atrial fib:   Still in AF.  Has been on Eliquis for 2 months . TSH is normal .   Still has DOE with exertion but is overall doing ok  Will arrange for Cardioversion next week after covid screening  Will also get echo. Follow up in 3 months     Medication Adjustments/Labs and Tests Ordered: Current medicines are reviewed at length with the patient today.  Concerns regarding medicines are outlined above.  Orders Placed This Encounter  Procedures  . Basic metabolic panel  . CBC  . ECHOCARDIOGRAM COMPLETE   No orders of the defined types were placed in this encounter.    Patient Instructions  Medication  Instructions:  Your physician recommends that you continue on your current medications as directed.  Please refer to the Current Medication list given to you today.  If you need a refill on your cardiac medications before your next appointment, please call your pharmacy.   Lab work: BMET and CBC today  If you have labs (blood work) drawn today and your tests are completely normal, you will receive your results only by: Marland Kitchen MyChart Message (if you have MyChart) OR . A paper copy in the mail If you have any lab test that is abnormal or we need to change your treatment, we will call you to review the results.  Testing/Procedures: Your physician has requested that you have an echocardiogram. Echocardiography is a painless test that uses sound waves to create images of your heart. It provides your doctor with information about the size and shape of your heart and how well your heart's chambers and valves are working. This procedure takes approximately one hour. There are no restrictions for this procedure.  Your physician has recommended that you have a Cardioversion (DCCV). Electrical Cardioversion uses a jolt of electricity to your heart either through paddles or wired patches attached to your chest. This is a controlled, usually prescheduled, procedure. Defibrillation is done under light anesthesia in the hospital, and you usually go home the day of the procedure. This is done to get your heart back into a normal rhythm. You are not awake for the procedure. Please see the instruction sheet given to you today.   Follow-Up: At [facility], you and your health needs are our priority.  As part of our continuing mission to provide you with exceptional heart care, we have created designated Provider Care Teams.  These Care Teams include your primary Cardiologist (physician) and Advanced Practice Providers (APPs -  Physician Assistants and Nurse Practitioners) who all work together to provide you with the  care you need, when you need it. You will need a follow up appointment in:  3 months.  Please call our office 2 months in advance to schedule this appointment.  You may see Dr. Acie Fredrickson or one of the following Advanced Practice Providers on your designated Care Team: Richardson Dopp, PA-C [place], [place] . Daune Perch, NP  Any Other Special Instructions Will Be Listed Below (If Applicable).       Signed, Mertie Moores, MD  12/11/2018 5:32 PM    [place]

## 2018-12-10 NOTE — Progress Notes (Signed)
Cardiology Office Note:    Date:  12/11/2018   ID:  [name], DOB Aug 11, 1945, MRN [id number]  PCP:  [name], MD  Cardiologist:   Sylvia Helms Electrophysiologist:  None   Referring MD: [name], MD   Chief Complaint  Patient presents with  . Atrial Fibrillation    Sept. 15, 2020   [name] is a 73 y.o. female with a recent diagnosis of atrial fib with RVR . We were asked to see her today by Dr. Elease Hashimoto for further evaluation of her atrial fib.   Cannot tell when she is in atrial fib or now She had AF with RVR  She was found to have AF RVR.    She found her rapid HR when she put on a pulse oxymeter and found her HR to be 160 .  Noticed palpitations when she was busy taking care of family who was visiting her from out of town.  Is also on Eliquis .  She is feeling better.   Is an ovarian cancer surviver.   Has neuropathy from chemo .    Past Medical History:  Diagnosis Date  . Cancer ([place]) 2007   ovarian Ca stage III  . Cervicalgia 05/19/2009  . Headache(784.0) 05/19/2009  . NEOPLASM, MALIGNANT, OVARY, HX OF 06/30/2008  . SPRAIN AND STRAIN OF UNSPECIFIED SITE OF HAND 05/19/2009  . VIRAL URI 03/06/2009  . WEIGHT GAIN 06/30/2008    Past Surgical History:  Procedure Laterality Date  . ABDOMINAL HYSTERECTOMY    . APPENDECTOMY    . CESAREAN SECTION     x2  . CHOLECYSTECTOMY  2011  . GASTRIC BYPASS  1979  . INGUINAL HERNIA REPAIR    . ovarian cancer  2007  . RENAL CALCULI    . SPLENECTOMY  2007  . TRANSURETHRAL RESECTION OF BLADDER  2007   LESION    Current Medications: Current Meds  Medication Sig  . calcium carbonate (OS-CAL - DOSED IN MG OF ELEMENTAL CALCIUM) 1250 MG tablet Take 1 tablet by mouth daily.   Marland Kitchen diltiazem (CARDIZEM CD) 120 MG 24 hr capsule Take 1 capsule (120 mg total) by mouth daily.  Marland Kitchen ELIQUIS 5 MG TABS tablet TAKE 1 TABLET BY MOUTH TWICE A DAY  . metoprolol succinate (TOPROL-XL) 100 MG 24 hr tablet Take 1 tablet  (100 mg total) by mouth daily. Take with or immediately following a meal.  . Multiple Vitamin (MULTIVITAMIN) tablet Take 1 tablet by mouth daily.   . ondansetron (ZOFRAN ODT) 4 MG disintegrating tablet Take 1 tablet (4 mg total) by mouth every 8 (eight) hours as needed for nausea or vomiting.  Marland Kitchen zolpidem (AMBIEN) 10 MG tablet TAKE 1 TABLET BY MOUTH AT BEDTIME AS NEEDED     Allergies:   Morphine and related and Penicillins   Social History   Socioeconomic History  . Marital status: Married    Spouse name: Not on file  . Number of children: Not on file  . Years of education: Not on file  . Highest education level: Not on file  Occupational History  . Not on file  Social Needs  . Financial resource strain: Not on file  . Food insecurity    Worry: Not on file    Inability: Not on file  . Transportation needs    Medical: Not on file    Non-medical: Not on file  Tobacco Use  . Smoking status: Never Smoker  . Smokeless tobacco: Never Used  Substance and Sexual Activity  . Alcohol use: No  . Drug use: No  . Sexual activity: Yes    Partners: Male    Comment: 1ST intercourse- 37, partners- 71, married- 62 yrs   Lifestyle  . Physical activity    Days per week: Not on file    Minutes per session: Not on file  . Stress: Not on file  Relationships  . Social Herbalist on phone: Not on file    Gets together: Not on file    Attends religious service: Not on file    Active member of club or organization: Not on file    Attends meetings of clubs or organizations: Not on file    Relationship status: Not on file  Other Topics Concern  . Not on file  Social History Narrative  . Not on file     Family History: The patient's family history includes Arthritis in her mother and another family member; Cancer in her sister; Cancer (age of onset: 46) in her father; Diabetes in her sister.  ROS:   Please see the history of present illness.     All other systems reviewed and are  negative.  EKGs/Labs/Other Studies Reviewed:    The following studies were reviewed today:   EKG:  October 10, 2018:   Afib at 138.   Recent Labs: 10/10/2018: ALT 17; BUN 14; Creatinine, Ser 1.24; Hemoglobin 12.5; Platelets 287.0; Potassium 4.0; Sodium 143; TSH 3.20  Recent Lipid Panel    Component Value Date/Time   CHOL 148 12/23/2011 0922   TRIG 54.0 12/23/2011 0922   HDL 54.70 12/23/2011 0922   CHOLHDL 3 12/23/2011 0922   VLDL 10.8 12/23/2011 0922   LDLCALC 83 12/23/2011 0922    Physical Exam:    VS:  BP 102/74   Pulse 85   Ht 5\' 2"  (1.575 m)   Wt 182 lb 12.8 oz (82.9 kg)   SpO2 98%   BMI 33.43 kg/m     Wt Readings from Last 3 Encounters:  12/11/18 182 lb 12.8 oz (82.9 kg)  11/23/18 182 lb 14.4 oz (83 kg)  11/06/18 180 lb (81.6 kg)     GEN:  Elderly female,  Mild obesity  HEENT: Normal NECK: No JVD; No carotid bruits LYMPHATICS: No lymphadenopathy CARDIAC:  Irreg.   irreg RESPIRATORY:  Clear to auscultation without rales, wheezing or rhonchi  ABDOMEN: Soft, non-tender, non-distended MUSCULOSKELETAL:  No edema; No deformity  SKIN: Warm and dry NEUROLOGIC:  Alert and oriented x 3 PSYCHIATRIC:  Normal affect   ASSESSMENT:    1. Atrial fibrillation with rapid ventricular response (HCC)    PLAN:    In order of problems listed above:  1. Atrial fib:   Still in AF.  Has been on Eliquis for 2 months . TSH is normal .   Still has DOE with exertion but is overall doing ok  Will arrange for Cardioversion next week after covid screening  Will also get echo. Follow up in 3 months     Medication Adjustments/Labs and Tests Ordered: Current medicines are reviewed at length with the patient today.  Concerns regarding medicines are outlined above.  Orders Placed This Encounter  Procedures  . Basic metabolic panel  . CBC  . ECHOCARDIOGRAM COMPLETE   No orders of the defined types were placed in this encounter.    Patient Instructions  Medication  Instructions:  Your physician recommends that you continue on your current medications as directed.  Please refer to the Current Medication list given to you today.  If you need a refill on your cardiac medications before your next appointment, please call your pharmacy.   Lab work: BMET and CBC today  If you have labs (blood work) drawn today and your tests are completely normal, you will receive your results only by: Marland Kitchen MyChart Message (if you have MyChart) OR . A paper copy in the mail If you have any lab test that is abnormal or we need to change your treatment, we will call you to review the results.  Testing/Procedures: Your physician has requested that you have an echocardiogram. Echocardiography is a painless test that uses sound waves to create images of your heart. It provides your doctor with information about the size and shape of your heart and how well your heart's chambers and valves are working. This procedure takes approximately one hour. There are no restrictions for this procedure.  Your physician has recommended that you have a Cardioversion (DCCV). Electrical Cardioversion uses a jolt of electricity to your heart either through paddles or wired patches attached to your chest. This is a controlled, usually prescheduled, procedure. Defibrillation is done under light anesthesia in the hospital, and you usually go home the day of the procedure. This is done to get your heart back into a normal rhythm. You are not awake for the procedure. Please see the instruction sheet given to you today.   Follow-Up: At [facility], you and your health needs are our priority.  As part of our continuing mission to provide you with exceptional heart care, we have created designated Provider Care Teams.  These Care Teams include your primary Cardiologist (physician) and Advanced Practice Providers (APPs -  Physician Assistants and Nurse Practitioners) who all work together to provide you with the  care you need, when you need it. You will need a follow up appointment in:  3 months.  Please call our office 2 months in advance to schedule this appointment.  You may see Dr. Acie Fredrickson or one of the following Advanced Practice Providers on your designated Care Team: Richardson Dopp, PA-C [place], [place] . Daune Perch, NP  Any Other Special Instructions Will Be Listed Below (If Applicable).       Signed, Mertie Moores, MD  12/11/2018 5:32 PM    [place]

## 2018-12-11 ENCOUNTER — Encounter

## 2018-12-11 ENCOUNTER — Encounter: Payer: Self-pay | Admitting: Cardiovascular Disease

## 2018-12-11 ENCOUNTER — Other Ambulatory Visit: Payer: Self-pay

## 2018-12-11 ENCOUNTER — Encounter: Payer: Self-pay | Admitting: *Deleted

## 2018-12-11 ENCOUNTER — Ambulatory Visit (INDEPENDENT_AMBULATORY_CARE_PROVIDER_SITE_OTHER): Payer: Medicare Other | Admitting: Cardiovascular Disease

## 2018-12-11 VITALS — BP 102/74 | HR 85 | Ht 62.0 in | Wt 182.8 lb

## 2018-12-11 DIAGNOSIS — I4891 Unspecified atrial fibrillation: Secondary | ICD-10-CM

## 2018-12-11 LAB — CBC
Hematocrit: 41.6 % (ref 34.0–46.6)
Hemoglobin: 14.3 g/dL (ref 11.1–15.9)
MCH: 31.8 pg (ref 26.6–33.0)
MCHC: 34.4 g/dL (ref 31.5–35.7)
MCV: 93 fL (ref 79–97)
Platelets: 265 x10E3/uL (ref 150–450)
RBC: 4.49 x10E6/uL (ref 3.77–5.28)
RDW: 12.5 % (ref 11.7–15.4)
WBC: 8.1 x10E3/uL (ref 3.4–10.8)

## 2018-12-11 LAB — BASIC METABOLIC PANEL
BUN/Creatinine Ratio: 15 (ref 12–28)
BUN: 16 mg/dL (ref 8–27)
CO2: 21 mmol/L (ref 20–29)
Calcium: 9.1 mg/dL (ref 8.7–10.3)
Chloride: 104 mmol/L (ref 96–106)
Creatinine, Ser: 1.09 mg/dL — ABNORMAL HIGH (ref 0.57–1.00)
GFR calc Af Amer: 59 mL/min/{1.73_m2} — ABNORMAL LOW (ref >59)
GFR calc non Af Amer: 51 mL/min/{1.73_m2} — ABNORMAL LOW (ref >59)
Glucose: 87 mg/dL (ref 65–99)
Potassium: 4.5 mmol/L (ref 3.5–5.2)
Sodium: 140 mmol/L (ref 134–144)

## 2018-12-11 NOTE — Patient Instructions (Addendum)
Medication Instructions:  Your physician recommends that you continue on your current medications as directed. Please refer to the Current Medication list given to you today.  If you need a refill on your cardiac medications before your next appointment, please call your pharmacy.   Lab work: BMET and CBC today  If you have labs (blood work) drawn today and your tests are completely normal, you will receive your results only by: Marland Kitchen MyChart Message (if you have MyChart) OR . A paper copy in the mail If you have any lab test that is abnormal or we need to change your treatment, we will call you to review the results.  Testing/Procedures: Your physician has requested that you have an echocardiogram. Echocardiography is a painless test that uses sound waves to create images of your heart. It provides your doctor with information about the size and shape of your heart and how well your heart's chambers and valves are working. This procedure takes approximately one hour. There are no restrictions for this procedure.  Your physician has recommended that you have a Cardioversion (DCCV). Electrical Cardioversion uses a jolt of electricity to your heart either through paddles or wired patches attached to your chest. This is a controlled, usually prescheduled, procedure. Defibrillation is done under light anesthesia in the hospital, and you usually go home the day of the procedure. This is done to get your heart back into a normal rhythm. You are not awake for the procedure. Please see the instruction sheet given to you today.   Follow-Up: At [facility], you and your health needs are our priority.  As part of our continuing mission to provide you with exceptional heart care, we have created designated Provider Care Teams.  These Care Teams include your primary Cardiologist (physician) and Advanced Practice Providers (APPs -  Physician Assistants and Nurse Practitioners) who all work together to provide  you with the care you need, when you need it. You will need a follow up appointment in:  3 months.  Please call our office 2 months in advance to schedule this appointment.  You may see Dr. Acie Fredrickson or one of the following Advanced Practice Providers on your designated Care Team: Richardson Dopp, PA-C [place], [place] . Daune Perch, NP  Any Other Special Instructions Will Be Listed Below (If Applicable).

## 2018-12-14 ENCOUNTER — Other Ambulatory Visit (HOSPITAL_COMMUNITY)
Admission: RE | Admit: 2018-12-14 | Discharge: 2018-12-14 | Disposition: A | Discharge status: Home / Self Care | Payer: Medicare Other | Source: Ambulatory Visit | Attending: Cardiovascular Disease | Admitting: Cardiovascular Disease

## 2018-12-14 DIAGNOSIS — Z20828 Contact with and (suspected) exposure to other viral communicable diseases: Secondary | ICD-10-CM | POA: Insufficient documentation

## 2018-12-14 DIAGNOSIS — Z01812 Encounter for preprocedural laboratory examination: Secondary | ICD-10-CM | POA: Diagnosis present

## 2018-12-14 LAB — SARS CORONAVIRUS 2 (TAT 6-24 HRS): SARS Coronavirus 2: NEGATIVE

## 2018-12-14 NOTE — Progress Notes (Signed)
Spoke with patient regarding appt Monday. Reminded patient to quarantine all weekend and answered all questions regarding procedure.

## 2018-12-17 ENCOUNTER — Ambulatory Visit (HOSPITAL_COMMUNITY): Payer: Medicare Other | Admitting: Anesthesiology

## 2018-12-17 ENCOUNTER — Other Ambulatory Visit: Payer: Self-pay

## 2018-12-17 ENCOUNTER — Other Ambulatory Visit: Payer: Self-pay | Admitting: Cardiovascular Disease

## 2018-12-17 ENCOUNTER — Ambulatory Visit (HOSPITAL_COMMUNITY)
Admission: RE | Admit: 2018-12-17 | Discharge: 2018-12-17 | Disposition: A | Discharge status: Home / Self Care | Payer: Medicare Other | Attending: Cardiovascular Disease | Admitting: Cardiovascular Disease

## 2018-12-17 ENCOUNTER — Encounter (HOSPITAL_COMMUNITY): Payer: Self-pay | Admitting: Certified Registered Nurse Anesthetist

## 2018-12-17 ENCOUNTER — Encounter (HOSPITAL_COMMUNITY)
Admission: RE | Disposition: A | Discharge status: Home / Self Care | Payer: Self-pay | Source: Home / Self Care | Attending: Cardiovascular Disease

## 2018-12-17 DIAGNOSIS — Z9081 Acquired absence of spleen: Secondary | ICD-10-CM | POA: Diagnosis not present

## 2018-12-17 DIAGNOSIS — E669 Obesity, unspecified: Secondary | ICD-10-CM | POA: Diagnosis not present

## 2018-12-17 DIAGNOSIS — I480 Paroxysmal atrial fibrillation: Secondary | ICD-10-CM

## 2018-12-17 DIAGNOSIS — Z79899 Other long term (current) drug therapy: Secondary | ICD-10-CM | POA: Diagnosis not present

## 2018-12-17 DIAGNOSIS — Z880 Allergy status to penicillin: Secondary | ICD-10-CM | POA: Insufficient documentation

## 2018-12-17 DIAGNOSIS — Z885 Allergy status to narcotic agent status: Secondary | ICD-10-CM | POA: Diagnosis not present

## 2018-12-17 DIAGNOSIS — I4891 Unspecified atrial fibrillation: Secondary | ICD-10-CM | POA: Diagnosis not present

## 2018-12-17 DIAGNOSIS — Z9884 Bariatric surgery status: Secondary | ICD-10-CM | POA: Insufficient documentation

## 2018-12-17 DIAGNOSIS — Z90710 Acquired absence of both cervix and uterus: Secondary | ICD-10-CM | POA: Diagnosis not present

## 2018-12-17 DIAGNOSIS — Z809 Family history of malignant neoplasm, unspecified: Secondary | ICD-10-CM | POA: Diagnosis not present

## 2018-12-17 DIAGNOSIS — Z7901 Long term (current) use of anticoagulants: Secondary | ICD-10-CM | POA: Insufficient documentation

## 2018-12-17 DIAGNOSIS — Z8543 Personal history of malignant neoplasm of ovary: Secondary | ICD-10-CM | POA: Diagnosis not present

## 2018-12-17 DIAGNOSIS — Z6833 Body mass index (BMI) 33.0-33.9, adult: Secondary | ICD-10-CM | POA: Insufficient documentation

## 2018-12-17 DIAGNOSIS — R0609 Other forms of dyspnea: Secondary | ICD-10-CM | POA: Diagnosis not present

## 2018-12-17 DIAGNOSIS — G620 Drug-induced polyneuropathy: Secondary | ICD-10-CM | POA: Diagnosis not present

## 2018-12-17 DIAGNOSIS — I4821 Permanent atrial fibrillation: Secondary | ICD-10-CM

## 2018-12-17 HISTORY — PX: CARDIOVERSION: SHX1299

## 2018-12-17 SURGERY — CARDIOVERSION
Anesthesia: General

## 2018-12-17 MED ORDER — LIDOCAINE 2% (20 MG/ML) 5 ML SYRINGE
INTRAMUSCULAR | Status: DC | PRN
  Administered 2018-12-17: 50 mg via INTRAVENOUS

## 2018-12-17 MED ORDER — PROPOFOL 10 MG/ML IV BOLUS
INTRAVENOUS | Status: DC | PRN
  Administered 2018-12-17: 60 mg via INTRAVENOUS

## 2018-12-17 MED ORDER — SODIUM CHLORIDE 0.9 % IV SOLN
INTRAVENOUS | Status: DC | PRN
  Administered 2018-12-17: 11:00:00 via INTRAVENOUS

## 2018-12-17 NOTE — Anesthesia Postprocedure Evaluation (Signed)
Anesthesia Post Note  Patient: [name]  Procedure(s) Performed: CARDIOVERSION (N/A )     Patient location during evaluation: Endoscopy Anesthesia Type: General Level of consciousness: awake and alert Pain management: pain level controlled Vital Signs Assessment: post-procedure vital signs reviewed and stable Respiratory status: spontaneous breathing, nonlabored ventilation, respiratory function stable and patient connected to nasal cannula oxygen Cardiovascular status: blood pressure returned to baseline and stable Postop Assessment: no apparent nausea or vomiting Anesthetic complications: no    Last Vitals:  Vitals:   12/17/18 1054 12/17/18 1056  BP:  (!) 112/53  Pulse: 73 63  Resp: (!) 21 14  Temp:  (!) 36.2 C  SpO2: 95% 98%    Last Pain:  Vitals:   12/17/18 1056  TempSrc: Temporal  PainSc: 0-No pain                 Barnet Glasgow

## 2018-12-17 NOTE — Anesthesia Preprocedure Evaluation (Addendum)
Anesthesia Evaluation  Patient identified by MRN, date of birth, ID band Patient awake    Reviewed: Allergy & Precautions, H&P , NPO status , Patient's Chart, lab work & pertinent test results  Airway Mallampati: II  TM Distance: >3 FB Neck ROM: Full    Dental no notable dental hx. (+) Teeth Intact   Pulmonary neg pulmonary ROS,    Pulmonary exam normal breath sounds clear to auscultation       Cardiovascular Exercise Tolerance: Good negative cardio ROS Normal cardiovascular exam+ dysrhythmias Atrial Fibrillation  Rhythm:Regular Rate:Normal     Neuro/Psych  Headaches, negative psych ROS   GI/Hepatic negative GI ROS, Neg liver ROS,   Endo/Other    Renal/GU Renal disease   Hx of ovarian CA 2007    Musculoskeletal   Abdominal   Peds  Hematology   Anesthesia Other Findings   Reproductive/Obstetrics                            Anesthesia Physical Anesthesia Plan  ASA: II  Anesthesia Plan: General   Post-op Pain Management:    Induction: Intravenous  PONV Risk Score and Plan: Treatment may vary due to age or medical condition  Airway Management Planned: Natural Airway and Nasal Cannula  Additional Equipment: None  Intra-op Plan:   Post-operative Plan:   Informed Consent: I have reviewed the patients History and Physical, chart, labs and discussed the procedure including the risks, benefits and alternatives for the proposed anesthesia with the patient or authorized representative who has indicated his/her understanding and acceptance.     Dental advisory given  Plan Discussed with: CRNA and Anesthesiologist  Anesthesia Plan Comments:        Anesthesia Quick Evaluation

## 2018-12-17 NOTE — Discharge Instructions (Signed)

## 2018-12-17 NOTE — CV Procedure (Signed)
Procedure:   DCCV  Indication:  Symptomatic atrial fibrillation  Procedure Note:  The patient signed informed consent.  They have had had therapeutic anticoagulation with apixaban greater than 3 weeks.  Anesthesia was administered by Dr. Valma Cava.  Adequate airway was maintained throughout and vital followed per protocol.  They were cardioverted x 2 with 120, 150J of biphasic synchronized energy.  They converted to NSR.  There were no apparent complications.  The patient had normal neuro status and respiratory status post procedure with vitals stable as recorded elsewhere.    Follow up:  They will continue on current medical therapy and follow up with cardiology as scheduled.  Buford Dresser, MD PhD 12/17/2018 11:07 AM

## 2018-12-17 NOTE — Anesthesia Postprocedure Evaluation (Signed)
Anesthesia Post Note  Patient: [name]  Procedure(s) Performed: CARDIOVERSION (N/A )     Anesthesia Post Evaluation  Last Vitals:  Vitals:   12/17/18 1054 12/17/18 1056  BP:  (!) 112/53  Pulse: 73 63  Resp: (!) 21 14  Temp:  (!) 36.2 C  SpO2: 95% 98%    Last Pain:  Vitals:   12/17/18 1056  TempSrc: Temporal  PainSc: 0-No pain                 Barnet Glasgow

## 2018-12-17 NOTE — Interval H&P Note (Signed)
History and Physical Interval Note:  12/17/2018 10:22 AM  [name]  has presented today for surgery, with the diagnosis of AFIB.  The various methods of treatment have been discussed with the patient and family. After consideration of risks, benefits and other options for treatment, the patient has consented to  Procedure(s): CARDIOVERSION (N/A) as a surgical intervention.  The patient's history has been reviewed, patient examined, no change in status, stable for surgery.  I have reviewed the patient's chart and labs.  Questions were answered to the patient's satisfaction.     [name] Harrell Gave

## 2018-12-17 NOTE — Transfer of Care (Signed)
Immediate Anesthesia Transfer of Care Note  Patient: [name]  Procedure(s) Performed: CARDIOVERSION (N/A )  Patient Location: Endoscopy Unit  Anesthesia Type:General  Level of Consciousness: awake, alert  and oriented  Airway & Oxygen Therapy: Patient Spontanous Breathing and Patient connected to face mask oxygen  Post-op Assessment: Report given to RN and Post -op Vital signs reviewed and stable  Post vital signs: Reviewed and stable  Last Vitals:  Vitals Value Taken Time  BP 117/55   Temp    Pulse 73 12/17/18 1054  Resp 21 12/17/18 1054  SpO2 95 % 12/17/18 1054    Last Pain:  Vitals:   12/17/18 1029  TempSrc: Temporal  PainSc: 0-No pain         Complications: No apparent anesthesia complications

## 2018-12-19 ENCOUNTER — Ambulatory Visit (HOSPITAL_COMMUNITY): Payer: Medicare Other | Attending: Cardiology

## 2018-12-19 ENCOUNTER — Other Ambulatory Visit: Payer: Self-pay

## 2018-12-19 ENCOUNTER — Encounter (HOSPITAL_COMMUNITY): Payer: Self-pay | Admitting: Cardiology

## 2018-12-19 DIAGNOSIS — I4891 Unspecified atrial fibrillation: Secondary | ICD-10-CM | POA: Diagnosis present

## 2018-12-27 ENCOUNTER — Telehealth: Payer: Self-pay | Admitting: Nurse Practitioner

## 2018-12-27 ENCOUNTER — Ambulatory Visit: Payer: Medicare Other | Admitting: Cardiology

## 2018-12-27 NOTE — Telephone Encounter (Signed)
[name], Christina Cheng, MD  Swinyer, Lanice Schwab, RN        Pt has mildly reduced LV function - likely due to AF with RVR.  Will reassess in several months with her in NSR    Results and plan of care reviewed with patient. Questions were answered to her satisfaction and she thanked me for the call.

## 2019-01-08 ENCOUNTER — Encounter: Payer: Medicare Other | Admitting: Obstetrics & Gynecology

## 2019-01-28 ENCOUNTER — Other Ambulatory Visit: Payer: Self-pay | Admitting: Family Medicine

## 2019-01-30 ENCOUNTER — Other Ambulatory Visit: Payer: Self-pay | Admitting: Family Medicine

## 2019-03-13 ENCOUNTER — Ambulatory Visit: Payer: Medicare Other | Admitting: Cardiovascular Disease

## 2019-03-13 ENCOUNTER — Encounter: Payer: Self-pay | Admitting: Cardiovascular Disease

## 2019-03-13 ENCOUNTER — Other Ambulatory Visit: Payer: Self-pay

## 2019-03-13 VITALS — BP 104/78 | HR 85 | Ht 62.0 in | Wt 190.4 lb

## 2019-03-13 DIAGNOSIS — E669 Obesity, unspecified: Secondary | ICD-10-CM | POA: Diagnosis not present

## 2019-03-13 DIAGNOSIS — R635 Abnormal weight gain: Secondary | ICD-10-CM

## 2019-03-13 DIAGNOSIS — I4891 Unspecified atrial fibrillation: Secondary | ICD-10-CM | POA: Diagnosis not present

## 2019-03-13 NOTE — Progress Notes (Signed)
Cardiology Office Note:    Date:  03/13/2019   ID:  [name], DOB [birth year], MRN [id number]  PCP:  [name], MD  Cardiologist:   Sonia Bromell Electrophysiologist:  None   Referring MD: [name], MD   Chief Complaint  Patient presents with  . Atrial Fibrillation    Sept. 15, 2020   [name] is a 73 y.o. female with a recent diagnosis of atrial fib with RVR . We were asked to see her today by Dr. Elease Hashimoto for further evaluation of her atrial fib.   Cannot tell when she is in atrial fib or now She had AF with RVR  She was found to have AF RVR.    She found her rapid HR when she put on a pulse oxymeter and found her HR to be 160 .  Noticed palpitations when she was busy taking care of family who was visiting her from out of town.  Is also on Eliquis .  She is feeling better.   Is an ovarian cancer surviver.   Has neuropathy from chemo .   March 13, 2019:  [name] is seen today for follow-up visit for her atrial fibrillation.  She had a cardioversion on December 17, 2018. She feels much better after the cardioversion. She is gained a little bit of weight since we last saw her. Wt is 190  ( up 8 lbs from Sept, 2020)  Knows she needs to walk more   Past Medical History:  Diagnosis Date  . Cancer ([place]) 2007   ovarian Ca stage III  . Cervicalgia 05/19/2009  . Headache(784.0) 05/19/2009  . NEOPLASM, MALIGNANT, OVARY, HX OF 06/30/2008  . SPRAIN AND STRAIN OF UNSPECIFIED SITE OF HAND 05/19/2009  . VIRAL URI 03/06/2009  . WEIGHT GAIN 06/30/2008    Past Surgical History:  Procedure Laterality Date  . ABDOMINAL HYSTERECTOMY    . APPENDECTOMY    . CARDIOVERSION N/A 12/17/2018   Procedure: CARDIOVERSION;  Surgeon: Buford Dresser, MD;  Location: [facility] ENDOSCOPY;  Service: Cardiovascular;  Laterality: N/A;  . CESAREAN SECTION     x2  . CHOLECYSTECTOMY  2011  . GASTRIC BYPASS  1979  . INGUINAL HERNIA REPAIR    . ovarian cancer  2007  .  RENAL CALCULI    . SPLENECTOMY  2007  . TRANSURETHRAL RESECTION OF BLADDER  2007   LESION    Current Medications: Current Meds  Medication Sig  . acetaminophen (TYLENOL) 500 MG tablet Take 1,000 mg by mouth every 6 (six) hours as needed for moderate pain or headache.  . diltiazem (CARDIZEM CD) 120 MG 24 hr capsule TAKE 1 CAPSULE BY MOUTH EVERY DAY  . ELIQUIS 5 MG TABS tablet TAKE 1 TABLET BY MOUTH TWICE A DAY  . metoprolol succinate (TOPROL-XL) 100 MG 24 hr tablet Take 1 tablet (100 mg total) by mouth daily. Take with or immediately following a meal.  . Multiple Vitamin (MULTIVITAMIN) tablet Take 1 tablet by mouth daily.   . ondansetron (ZOFRAN ODT) 4 MG disintegrating tablet Take 1 tablet (4 mg total) by mouth every 8 (eight) hours as needed for nausea or vomiting.  Marland Kitchen zolpidem (AMBIEN) 10 MG tablet TAKE 1 TABLET BY MOUTH AT BEDTIME AS NEEDED     Allergies:   Morphine and related and Penicillins   Social History   Socioeconomic History  . Marital status: Married    Spouse name: Not on file  . Number of children: Not on file  .  Years of education: Not on file  . Highest education level: Not on file  Occupational History  . Not on file  Tobacco Use  . Smoking status: Never Smoker  . Smokeless tobacco: Never Used  Substance and Sexual Activity  . Alcohol use: No  . Drug use: No  . Sexual activity: Yes    Partners: Male    Comment: 1ST intercourse- 51, partners- 76, married- 51 yrs   Other Topics Concern  . Not on file  Social History Narrative  . Not on file   Social Determinants of Health   Financial Resource Strain:   . Difficulty of Paying Living Expenses: Not on file  Food Insecurity:   . Worried About Charity fundraiser in the Last Year: Not on file  . Ran Out of Food in the Last Year: Not on file  Transportation Needs:   . Lack of Transportation (Medical): Not on file  . Lack of Transportation (Non-Medical): Not on file  Physical Activity:   . Days of  Exercise per Week: Not on file  . Minutes of Exercise per Session: Not on file  Stress:   . Feeling of Stress : Not on file  Social Connections:   . Frequency of Communication with Friends and Family: Not on file  . Frequency of Social Gatherings with Friends and Family: Not on file  . Attends Religious Services: Not on file  . Active Member of Clubs or Organizations: Not on file  . Attends Archivist Meetings: Not on file  . Marital Status: Not on file     Family History: The patient's family history includes Arthritis in her mother and another family member; Cancer in her sister; Cancer (age of onset: 2) in her father; Diabetes in her sister.  ROS:   Please see the history of present illness.     All other systems reviewed and are negative.  EKGs/Labs/Other Studies Reviewed:    The following studies were reviewed today:   EKG:   Recent Labs: 10/10/2018: ALT 17; TSH 3.20 12/11/2018: BUN 16; Creatinine, Ser 1.09; Hemoglobin 14.3; Platelets 265; Potassium 4.5; Sodium 140  Recent Lipid Panel    Component Value Date/Time   CHOL 148 12/23/2011 0922   TRIG 54.0 12/23/2011 0922   HDL 54.70 12/23/2011 0922   CHOLHDL 3 12/23/2011 0922   VLDL 10.8 12/23/2011 0922   LDLCALC 83 12/23/2011 0922    Physical Exam:     Physical Exam: Blood pressure 104/78, pulse 85, height 5\' 2"  (1.575 m), weight 190 lb 6.4 oz (86.4 kg), SpO2 98 %.  GEN:  Well nourished, well developed in no acute distress HEENT: Normal NECK: No JVD; No carotid bruits LYMPHATICS: No lymphadenopathy CARDIAC: RRR , no murmurs, rubs, gallops RESPIRATORY:  Clear to auscultation without rales, wheezing or rhonchi  ABDOMEN: Soft, non-tender, non-distended MUSCULOSKELETAL:  No edema; No deformity  SKIN: Warm and dry NEUROLOGIC:  Alert and oriented x 3   ASSESSMENT:    1. Atrial fibrillation with rapid ventricular response (HCC)   2. Obesity (BMI 30-39.9)   3. WEIGHT GAIN    PLAN:      Atrial  fib: She is maintaining sinus rhythm.  Heart rate is on the upper end of normal but her blood pressure is low and we really do not have room to add more diltiazem or metoprolol.  I have encouraged her to work on diet diet, exercise, weight loss strategy which should help slow her heart rate.  We  will have her see an APP in 6 months.  I will plan on seeing her back in 1 year.  2.  Obesity: Strongly advised her to work on diet, exercise, weight loss program.  She is gained 8 pounds in the past 3 months.    Medication Adjustments/Labs and Tests Ordered: Current medicines are reviewed at length with the patient today.  Concerns regarding medicines are outlined above.  No orders of the defined types were placed in this encounter.  No orders of the defined types were placed in this encounter.    Patient Instructions  Medication Instructions:  Your physician recommends that you continue on your current medications as directed. Please refer to the Current Medication list given to you today.  *If you need a refill on your cardiac medications before your next appointment, please call your pharmacy*  Lab Work: None Ordered   Testing/Procedures: None Ordered   Follow-Up: At [facility], you and your health needs are our priority.  As part of our continuing mission to provide you with exceptional heart care, we have created designated Provider Care Teams.  These Care Teams include your primary Cardiologist (physician) and Advanced Practice Providers (APPs -  Physician Assistants and Nurse Practitioners) who all work together to provide you with the care you need, when you need it.  Your next appointment:   6 month(s)  The format for your next appointment:   Either In Person or Virtual  Provider:   Richardson Dopp, PA-C, Robbie Lis, PA-C or Daune Perch, NP       Signed, Mertie Moores, MD  03/13/2019 11:35 AM    [place]

## 2019-03-13 NOTE — Patient Instructions (Signed)
Medication Instructions:  Your physician recommends that you continue on your current medications as directed. Please refer to the Current Medication list given to you today.  *If you need a refill on your cardiac medications before your next appointment, please call your pharmacy*  Lab Work: None Ordered   Testing/Procedures: None Ordered   Follow-Up: At [facility], you and your health needs are our priority.  As part of our continuing mission to provide you with exceptional heart care, we have created designated Provider Care Teams.  These Care Teams include your primary Cardiologist (physician) and Advanced Practice Providers (APPs -  Physician Assistants and Nurse Practitioners) who all work together to provide you with the care you need, when you need it.  Your next appointment:   6 month(s)  The format for your next appointment:   Either In Person or Virtual  Provider:   Richardson Dopp, PA-C, Robbie Lis, PA-C or Daune Perch, NP

## 2019-03-26 ENCOUNTER — Encounter: Payer: Medicare Other | Admitting: Obstetrics & Gynecology

## 2019-03-30 ENCOUNTER — Other Ambulatory Visit: Payer: Self-pay | Admitting: Family Medicine

## 2019-04-25 ENCOUNTER — Other Ambulatory Visit: Payer: Self-pay | Admitting: Family Medicine

## 2019-04-26 ENCOUNTER — Ambulatory Visit: Payer: Medicare PPO

## 2019-04-29 ENCOUNTER — Other Ambulatory Visit: Payer: Self-pay | Admitting: Family Medicine

## 2019-05-01 ENCOUNTER — Telehealth: Payer: Self-pay | Admitting: Family Medicine

## 2019-05-01 ENCOUNTER — Ambulatory Visit: Payer: Medicare Other

## 2019-05-01 NOTE — Telephone Encounter (Signed)
Form placed in red folder. Please return once complete.

## 2019-05-01 NOTE — Telephone Encounter (Signed)
Handicap Placard form to be filled out- placed in dr's folder.  Call [phone] upon completion.

## 2019-05-02 NOTE — Telephone Encounter (Signed)
done

## 2019-05-03 ENCOUNTER — Ambulatory Visit: Payer: Medicare PPO

## 2019-05-03 NOTE — Telephone Encounter (Signed)
Form ready for pick up and patient is aware 

## 2019-05-05 ENCOUNTER — Ambulatory Visit: Payer: Medicare PPO | Attending: Internal Medicine

## 2019-05-05 DIAGNOSIS — Z23 Encounter for immunization: Secondary | ICD-10-CM | POA: Insufficient documentation

## 2019-05-05 NOTE — Progress Notes (Signed)
   Covid-19 Vaccination Clinic  Name:  [name]    MRN: [id number] DOB: 1945-05-07  05/05/2019  [name] was observed post Covid-19 immunization for 15 minutes without incidence. She was provided with Vaccine Information Sheet and instruction to access the V-Safe system.   [name] was instructed to call 911 with any severe reactions post vaccine: Marland Kitchen Difficulty breathing  . Swelling of your face and throat  . A fast heartbeat  . A bad rash all over your body  . Dizziness and weakness    Immunizations Administered    Name Date Dose VIS Date Route   Pfizer COVID-19 Vaccine 05/05/2019  9:22 AM 0.3 mL 03/08/2019 Intramuscular   Manufacturer: [place]   Lot: [id number]   [place]: [id number]

## 2019-05-23 ENCOUNTER — Encounter: Payer: Medicare Other | Admitting: Obstetrics & Gynecology

## 2019-05-26 ENCOUNTER — Other Ambulatory Visit: Payer: Self-pay | Admitting: Family Medicine

## 2019-05-29 ENCOUNTER — Ambulatory Visit: Payer: Medicare PPO | Attending: Internal Medicine

## 2019-05-29 DIAGNOSIS — Z23 Encounter for immunization: Secondary | ICD-10-CM

## 2019-05-29 NOTE — Progress Notes (Signed)
   Covid-19 Vaccination Clinic  Name:  [name]    MRN: [id number] DOB: [birth year]  05/29/2019  [name] was observed post Covid-19 immunization for 15 minutes without incident. She was provided with Vaccine Information Sheet and instruction to access the V-Safe system.   [name] was instructed to call 911 with any severe reactions post vaccine: Marland Kitchen Difficulty breathing  . Swelling of face and throat  . A fast heartbeat  . A bad rash all over body  . Dizziness and weakness   Immunizations Administered    Name Date Dose VIS Date Route   Pfizer COVID-19 Vaccine 05/29/2019  4:20 PM 0.3 mL 03/08/2019 Intramuscular   Manufacturer: [place]   Lot: [id number]   [place]: [id number]

## 2019-07-03 DIAGNOSIS — M25561 Pain in right knee: Secondary | ICD-10-CM | POA: Diagnosis not present

## 2019-07-03 DIAGNOSIS — M5431 Sciatica, right side: Secondary | ICD-10-CM | POA: Diagnosis not present

## 2019-07-10 ENCOUNTER — Encounter: Payer: Medicare Other | Admitting: Obstetrics & Gynecology

## 2019-07-20 ENCOUNTER — Other Ambulatory Visit: Payer: Self-pay | Admitting: Family Medicine

## 2019-09-17 NOTE — Progress Notes (Signed)
Cardiology Office Note:    Date:  09/18/2019   ID:  POLA FURNO, DOB Mar 12, 1946, MRN [id number]  PCP:  [name], MD  Cardiologist:  Mertie Moores, MD  Electrophysiologist:  None   Referring MD: [name], MD   Chief Complaint:  Follow-up (Atrial fibrillation)    Patient Profile:    [name] is a 74 y.o. female with:   Persistent atrial fibrillation   S/p DCCV 11/2018  CHA2DS2-VASc=3 (female, age x 1, LV dysfunction)   Cardiomyopathy  Echocardiogram 11/2018: EF 45-50, Gr 2 DD  Ovarian CA  Prior CV studies: Echocardiogram 12/19/2018 EF 45-50, mild post LVH, Gr 2 DD, mild reduced RVSF, severe LAE, mild MAC, trace MR, trivial TR  History of Present Illness:    [name] was seen by Dr. Acie Fredrickson in 11/2018 for AFib with RVR.  She underwent DCCV with restoration of normal sinus rhythm.  An echocardiogram in 11/2018 showed mildly reduced LVF with EF 26-94 and mod diastolic dysfunction.  She was last seen by Dr. Acie Fredrickson in 02/2019.  She remained in normal sinus rhythm and was feeling better.  She returns for follow up.      She is here alone today.  Since last seen, she has been feeling well.  She has not had chest pain, significant shortness of breath, syncope, orthopnea, leg swelling.  She did have an episode once in the last 6 months where she was near syncopal.  This was in a hot shower.  This has not happened again since that time.  She has been doing well with exercise and dieting.  She has lost 22 pounds since her last visit.  Past Medical History:  Diagnosis Date  . Cancer ([place]) 2007   ovarian Ca stage III  . Cervicalgia 05/19/2009  . Chronic insomnia 12/29/2011  . Headache(784.0) 05/19/2009  . Hx of splenomegaly 03/27/2013   2007 At the time of definitive surgery for ovarian cancer metastatic to omentum ; her spleen was nicked & resected.  She maintains pneumonia immunization prophylaxis   . Kidney stones 01/09/2013   Calcium stones    .  Lactic acidosis 04/08/2014  . NEOPLASM, MALIGNANT, OVARY, HX OF 06/30/2008  . Obesity (BMI 30-39.9) 01/09/2013  . Osteoporosis, unspecified 01/09/2013  . Persistent atrial fibrillation ([place]) 11/06/2018   S/p cardioversion 11/2018 //anticoagulation with Apixaban //Echo 11/2018: EF 45-50, LVH, GRII DD, mildly reduced RV SF, severe LAE  . Septic shock ([place]) 04/08/2014  . SPRAIN AND STRAIN OF UNSPECIFIED SITE OF HAND 05/19/2009  . Urinary tract infection 04/08/2014  . VIRAL URI 03/06/2009  . WEIGHT GAIN 06/30/2008    Current Medications: Current Meds  Medication Sig  . acetaminophen (TYLENOL) 500 MG tablet Take 1,000 mg by mouth every 6 (six) hours as needed for moderate pain or headache.  . diltiazem (CARDIZEM CD) 120 MG 24 hr capsule TAKE 1 CAPSULE BY MOUTH EVERY DAY  . ELIQUIS 5 MG TABS tablet TAKE 1 TABLET BY MOUTH TWICE A DAY  . metoprolol succinate (TOPROL-XL) 100 MG 24 hr tablet Take 1 tablet (100 mg total) by mouth daily. Take with or immediately following a meal.  . Multiple Vitamin (MULTIVITAMIN) tablet Take 1 tablet by mouth daily.   . ondansetron (ZOFRAN ODT) 4 MG disintegrating tablet Take 1 tablet (4 mg total) by mouth every 8 (eight) hours as needed for nausea or vomiting.  Marland Kitchen zolpidem (AMBIEN) 10 MG tablet TAKE 1 TABLET BY MOUTH EVERY DAY AT BEDTIME AS NEEDED  Allergies:   Morphine and related and Penicillins   Social History   Tobacco Use  . Smoking status: Never Smoker  . Smokeless tobacco: Never Used  Vaping Use  . Vaping Use: Never used  Substance Use Topics  . Alcohol use: No  . Drug use: No     Family Hx: The patient's family history includes Arthritis in her mother and another family member; Cancer in her sister; Cancer (age of onset: 25) in her father; Diabetes in her sister.  Review of Systems  Gastrointestinal: Negative for hematochezia and melena.  Genitourinary: Negative for hematuria.     EKGs/Labs/Other Test Reviewed:    EKG:  EKG is   ordered today.   The ekg ordered today demonstrates atrial fibrillation, HR 77, normal axis, nonspecific ST-T wave changes, QTC 416  Recent Labs: 10/10/2018: ALT 17; TSH 3.20 12/11/2018: BUN 16; Creatinine, Ser 1.09; Hemoglobin 14.3; Platelets 265; Potassium 4.5; Sodium 140   Recent Lipid Panel Lab Results  Component Value Date/Time   CHOL 148 12/23/2011 09:22 AM   TRIG 54.0 12/23/2011 09:22 AM   HDL 54.70 12/23/2011 09:22 AM   CHOLHDL 3 12/23/2011 09:22 AM   LDLCALC 83 12/23/2011 09:22 AM    Physical Exam:    VS:  BP (!) 100/58 (BP Location: Right Arm, Patient Position: Sitting, Cuff Size: Small)   Pulse 77   Ht _0  (1.575 m)   Wt 168 lb (76.2 kg)   SpO2 97%   BMI 30.73 kg/m     Wt Readings from Last 3 Encounters:  09/18/19 168 lb (76.2 kg)  03/13/19 190 lb 6.4 oz (86.4 kg)  12/11/18 182 lb 12.8 oz (82.9 kg)     Constitutional:      Appearance: Healthy appearance. Not in distress.  Neck:     Thyroid: No thyromegaly.     Vascular: JVD normal.  Pulmonary:     Breath sounds: No wheezing. No rales.  Cardiovascular:     Normal rate. Irregularly irregular rhythm. Normal S1. Normal S2.     Murmurs: There is no murmur.  Edema:    Peripheral edema absent.  Abdominal:     Palpations: Abdomen is soft. There is no hepatomegaly.  Skin:    General: Skin is warm and dry.  Neurological:     General: No focal deficit present.     Mental Status: Alert and oriented to person, place and time.     Cranial Nerves: Cranial nerves are intact.      ASSESSMENT & PLAN:    1. Persistent atrial fibrillation [facility]) The patient underwent cardioversion in September 2020.  At that time, she had atrial fibrillation with rapid ventricular rate.  Her electrocardiogram today does demonstrate recurrent atrial fibrillation.  Her heart rate is well controlled.  She is completely asymptomatic with atrial fibrillation.  Her blood pressure is running on the low side.  She has had one episode of near syncope.  This  occurred in the context of a hot shower.  This was likely related to hypotension due to vasodilation.  Otherwise, she has not had symptoms of near syncope or dizziness.  We discussed the rationale for rate versus rhythm control.  As her heart rate is well controlled, she is on chronic anticoagulation therapy with Apixaban and she is asymptomatic, I have recommended pursuing rate control.  She did have LV dysfunction by echocardiogram last year.  She does remain on diltiazem.  I have recommended that we proceed with a follow-up limited echocardiogram  to reassess LV function.  If her ejection fraction is lower, we may need to consider further work-up.  If her EF is lower, we will definitely need to discontinue her diltiazem.  Another cause to discontinue diltiazem would be if she has further episodes of near syncope related to hypotension.  Obtain follow-up BMET, CBC today.  Follow-up in 6 months.  2. Other cardiomyopathy ([place]) As noted, EF is 45-50 by echocardiogram September 2020.  This was likely related to tachycardia.  As noted, I will obtain a follow-up limited echocardiogram to reassess LV function.  If her EF remains down or is lower, we will need to discontinue calcium channel blocker therapy, consider ACE inhibitor/ARB and consider further work-up.    Dispo:  Return in about 6 months (around 03/19/2020) for Routine Follow Up, w/ Dr. Acie Fredrickson, in person.   Medication Adjustments/Labs and Tests Ordered: Current medicines are reviewed at length with the patient today.  Concerns regarding medicines are outlined above.  Tests Ordered: Orders Placed This Encounter  Procedures  . CBC  . Basic metabolic panel  . EKG 12-Lead  . ECHOCARDIOGRAM LIMITED   Medication Changes: No orders of the defined types were placed in this encounter.   Signed, Richardson Dopp, PA-C  09/18/2019 1:59 PM    [facility] [place], [place], [place]  [address] Phone: [phone]; Fax: [phone]

## 2019-09-18 ENCOUNTER — Other Ambulatory Visit: Payer: Self-pay | Admitting: Family Medicine

## 2019-09-18 ENCOUNTER — Encounter: Payer: Self-pay | Admitting: Physician Assistant

## 2019-09-18 ENCOUNTER — Other Ambulatory Visit: Payer: Self-pay

## 2019-09-18 ENCOUNTER — Ambulatory Visit (INDEPENDENT_AMBULATORY_CARE_PROVIDER_SITE_OTHER): Payer: Medicare PPO | Admitting: Physician Assistant

## 2019-09-18 VITALS — BP 100/58 | HR 77 | Ht 62.0 in | Wt 168.0 lb

## 2019-09-18 DIAGNOSIS — I4819 Other persistent atrial fibrillation: Secondary | ICD-10-CM | POA: Diagnosis not present

## 2019-09-18 DIAGNOSIS — I428 Other cardiomyopathies: Secondary | ICD-10-CM | POA: Diagnosis not present

## 2019-09-18 NOTE — Patient Instructions (Signed)
Medication Instructions:   Your physician recommends that you continue on your current medications as directed. Please refer to the Current Medication list given to you today.  *If you need a refill on your cardiac medications before your next appointment, please call your pharmacy*  Lab Work:  You will have labs drawn today: BMET/CBC  If you have labs (blood work) drawn today and your tests are completely normal, you will receive your results only by: Marland Kitchen MyChart Message (if you have MyChart) OR . A paper copy in the mail If you have any lab test that is abnormal or we need to change your treatment, we will call you to review the results.  Testing/Procedures:  Your physician has requested that you have a limited echocardiogram. Echocardiography is a painless test that uses sound waves to create images of your heart. It provides your doctor with information about the size and shape of your heart and how well your heart's chambers and valves are working. This procedure takes approximately one hour. There are no restrictions for this procedure.  Follow-Up: At [facility], you and your health needs are our priority.  As part of our continuing mission to provide you with exceptional heart care, we have created designated Provider Care Teams.  These Care Teams include your primary Cardiologist (physician) and Advanced Practice Providers (APPs -  Physician Assistants and Nurse Practitioners) who all work together to provide you with the care you need, when you need it.  We recommend signing up for the patient portal called "MyChart".  Sign up information is provided on this After Visit Summary.  MyChart is used to connect with patients for Virtual Visits (Telemedicine).  Patients are able to view lab/test results, encounter notes, upcoming appointments, etc.  Non-urgent messages can be sent to your provider as well.   To learn more about what you can do with MyChart, go to [email or web address].     Your next appointment:   6 month(s)  The format for your next appointment:   In Person  Provider:   Mertie Moores, MD

## 2019-09-19 LAB — BASIC METABOLIC PANEL
BUN/Creatinine Ratio: 16 (ref 12–28)
BUN: 16 mg/dL (ref 8–27)
CO2: 22 mmol/L (ref 20–29)
Calcium: 9.6 mg/dL (ref 8.7–10.3)
Chloride: 105 mmol/L (ref 96–106)
Creatinine, Ser: 0.99 mg/dL (ref 0.57–1.00)
GFR calc Af Amer: 66 mL/min/{1.73_m2} (ref >59)
GFR calc non Af Amer: 57 mL/min/{1.73_m2} — ABNORMAL LOW (ref >59)
Glucose: 90 mg/dL (ref 65–99)
Potassium: 5.1 mmol/L (ref 3.5–5.2)
Sodium: 141 mmol/L (ref 134–144)

## 2019-09-19 LAB — CBC
Hematocrit: 42.5 % (ref 34.0–46.6)
Hemoglobin: 14.5 g/dL (ref 11.1–15.9)
MCH: 33.3 pg — ABNORMAL HIGH (ref 26.6–33.0)
MCHC: 34.1 g/dL (ref 31.5–35.7)
MCV: 98 fL — ABNORMAL HIGH (ref 79–97)
Platelets: 215 10*3/uL (ref 150–450)
RBC: 4.35 x10E6/uL (ref 3.77–5.28)
RDW: 11.7 % (ref 11.7–15.4)
WBC: 8.4 10*3/uL (ref 3.4–10.8)

## 2019-10-04 ENCOUNTER — Encounter: Payer: Medicare PPO | Admitting: Obstetrics & Gynecology

## 2019-10-11 ENCOUNTER — Other Ambulatory Visit (HOSPITAL_COMMUNITY): Payer: Medicare PPO

## 2019-10-19 ENCOUNTER — Other Ambulatory Visit: Payer: Self-pay | Admitting: Family Medicine

## 2019-10-24 ENCOUNTER — Ambulatory Visit (HOSPITAL_COMMUNITY): Payer: Medicare PPO | Attending: Cardiovascular Disease

## 2019-10-24 ENCOUNTER — Other Ambulatory Visit: Payer: Self-pay

## 2019-10-24 DIAGNOSIS — I428 Other cardiomyopathies: Secondary | ICD-10-CM | POA: Insufficient documentation

## 2019-10-24 LAB — ECHOCARDIOGRAM LIMITED
Area-P 1/2: 3.46 cm2
MV M vel: 5.3 m/s
MV Peak grad: 112.4 mmHg
Radius: 0.3 cm
S' Lateral: 3.5 cm

## 2019-10-25 ENCOUNTER — Ambulatory Visit: Payer: Medicare PPO | Admitting: Obstetrics & Gynecology

## 2019-10-25 ENCOUNTER — Encounter: Payer: Self-pay | Admitting: Obstetrics & Gynecology

## 2019-10-25 VITALS — BP 112/72 | Ht 60.0 in | Wt 162.8 lb

## 2019-10-25 DIAGNOSIS — Z780 Asymptomatic menopausal state: Secondary | ICD-10-CM | POA: Diagnosis not present

## 2019-10-25 DIAGNOSIS — Z8543 Personal history of malignant neoplasm of ovary: Secondary | ICD-10-CM

## 2019-10-25 DIAGNOSIS — Z9289 Personal history of other medical treatment: Secondary | ICD-10-CM | POA: Diagnosis not present

## 2019-10-25 DIAGNOSIS — Z6831 Body mass index (BMI) 31.0-31.9, adult: Secondary | ICD-10-CM

## 2019-10-25 DIAGNOSIS — Z01419 Encounter for gynecological examination (general) (routine) without abnormal findings: Secondary | ICD-10-CM | POA: Diagnosis not present

## 2019-10-25 DIAGNOSIS — Z90710 Acquired absence of both cervix and uterus: Secondary | ICD-10-CM

## 2019-10-25 DIAGNOSIS — E6609 Other obesity due to excess calories: Secondary | ICD-10-CM | POA: Diagnosis not present

## 2019-10-25 DIAGNOSIS — M810 Age-related osteoporosis without current pathological fracture: Secondary | ICD-10-CM

## 2019-10-25 DIAGNOSIS — Z1272 Encounter for screening for malignant neoplasm of vagina: Secondary | ICD-10-CM | POA: Diagnosis not present

## 2019-10-25 NOTE — Progress Notes (Signed)
[name] Sep 02, 1945 [id number]   History:    74 y.o. G3P2A1L2  Married.  Retired Probation officer  2 grand-children 25 and 61 yo.  RP:  Established patient presenting for annual gyn exam   HPI: Status post TAH/BSO staging, appendectomy, splenectomy with Dr. Fermin Schwab in 2007 for ovarian cancer stage III.  Chemotherapy after for 9 weeks.  Ca125 has been negative since then.  Will repeat today.  No pelvic pain.  Pap test negative in 12/2017.  Breast normal.  BMI improved to 31.79.  On [employer].  Active physically, but difficulty doing fitness because of Arthritis.  Health labs with family physician.     Past medical history,surgical history, family history and social history were all reviewed and documented in the EPIC chart.  Gynecologic History No LMP recorded. Patient has had a hysterectomy.  Obstetric History OB History  Gravida Para Term Preterm AB Living  3 2 2   1 2   SAB TAB Ectopic Multiple Live Births  1       2    # Outcome Date GA Lbr Len/2nd Weight Sex Delivery Anes PTL Lv  3 Term 1982 [redacted]w[redacted]d  6 lb 11 oz (3.033 kg) M CS-LTranv Spinal  LIV  2 Term 1976 [redacted]w[redacted]d  7 lb 9 oz (3.43 kg) F CS-LTranv Spinal  LIV  1 SAB         DEC     ROS: A ROS was performed and pertinent positives and negatives are included in the history.  GENERAL: No fevers or chills. HEENT: No change in vision, no earache, sore throat or sinus congestion. NECK: No pain or stiffness. CARDIOVASCULAR: No chest pain or pressure. No palpitations. PULMONARY: No shortness of breath, cough or wheeze. GASTROINTESTINAL: No abdominal pain, nausea, vomiting or diarrhea, melena or bright red blood per rectum. GENITOURINARY: No urinary frequency, urgency, hesitancy or dysuria. MUSCULOSKELETAL: No joint or muscle pain, no back pain, no recent trauma. DERMATOLOGIC: No rash, no itching, no lesions. ENDOCRINE: No polyuria, polydipsia, no heat or cold intolerance. No recent change in weight. HEMATOLOGICAL: No anemia or  easy bruising or bleeding. NEUROLOGIC: No headache, seizures, numbness, tingling or weakness. PSYCHIATRIC: No depression, no loss of interest in normal activity or change in sleep pattern.     Exam:   BP 112/72   Ht 5' (1.524 m)   Wt 162 lb 12.8 oz (73.8 kg)   BMI 31.79 kg/m   Body mass index is 31.79 kg/m.  General appearance : Well developed well nourished female. No acute distress HEENT: Eyes: no retinal hemorrhage or exudates,  Neck supple, trachea midline, no carotid bruits, no thyroidmegaly Lungs: Clear to auscultation, no rhonchi or wheezes, or rib retractions  Heart: Regular rate and rhythm, no murmurs or gallops Breast:Examined in sitting and supine position were symmetrical in appearance, no palpable masses or tenderness,  no skin retraction, no nipple inversion, no nipple discharge, no skin discoloration, no axillary or supraclavicular lymphadenopathy Abdomen: no palpable masses or tenderness, no rebound or guarding Extremities: no edema or skin discoloration or tenderness  Pelvic: Vulva: Normal             Vagina: No gross lesions or discharge  Cervix/Uterus absent  Adnexa  Without masses or tenderness  Anus: Normal   Assessment/Plan:  74 y.o. female for annual exam   1. Encounter for Papanicolaou smear of vagina as part of routine gynecological examination Gynecologic exam status post TAH/BSO.  Pap reflex done on the vaginal vault today.  Breast exam normal.  Will schedule her screening mammogram now.  Colonoscopy 2011, due this year.  Health labs with family physician.  2. Status post total abdominal hysterectomy and bilateral salpingo-oophorectomy (TAH-BSO)  3. NEOPLASM, MALIGNANT, OVARY, HX OF History of ovarian cancer stage III status post TAH/BSO and chemotherapy in 2007.  Negative Ca1 25 since then.  Will repeat a CA-125 today. - CA 125  4. Postmenopausal Well on no hormone replacement therapy.  5. Age-related osteoporosis without current pathological  fracture Last bone density November 2019 showed osteoporosis.  Patient taking vitamin D supplements, calcium intake of 1200 mg daily and regular weightbearing physical activities.  We will repeat a bone density in November 2021. - DG Bone Density; Future  6. Class 1 obesity due to excess calories with serious comorbidity and body mass index (BMI) of 31.0 to 31.9 in adult On a low calorie/carb diet for weight loss.  Patient lost 28 pounds so far this year.  We will continue the same.  Aerobic activities 5 times a week and light weight lifting every 2 days recommended.  Princess Bruins MD, 11:17 AM 10/25/2019

## 2019-10-28 LAB — CA 125: CA 125: 6 U/mL (ref <35)

## 2019-10-29 LAB — PAP IG W/ RFLX HPV ASCU

## 2019-10-30 ENCOUNTER — Telehealth: Payer: Self-pay | Admitting: Cardiovascular Disease

## 2019-10-30 NOTE — Telephone Encounter (Signed)
I tried to call patient back, line is busy.

## 2019-10-30 NOTE — Telephone Encounter (Signed)
Patient is returning call to discuss results from echocardiogram completed on 10/24/19.

## 2019-10-31 ENCOUNTER — Encounter: Payer: Self-pay | Admitting: *Deleted

## 2019-11-01 NOTE — Telephone Encounter (Signed)
The patient has been notified of the result and verbalized understanding.  All questions (if any) were answered. Patient states that she does not want to increase Metoprolol or discontinue Diltiazem right now. She states while waiting for her echocardiogram she was in the lobby doing her daily exercises and stretching, her resting heart rate is usually 65-75 and when she exercises it gets up to about 134. She is feeling great and does not want to make any medications changes right now. She has an appointment on 11/20/19 to follow up and wants to discuss everything then. Patient aware to call if she has any changes in symptoms. Mady Haagensen, [place] 11/01/2019 9:32 AM

## 2019-11-01 NOTE — Telephone Encounter (Signed)
Her EF is low and Diltiazem is a medication we avoid using when EF is low as it can make the heart weaker.  Metoprolol (beta-blockers) are safe to use.  We can review all of this further at her follow up appt. Richardson Dopp, PA-C    11/01/2019 1:42 PM

## 2019-11-01 NOTE — Telephone Encounter (Signed)
Patient aware.

## 2019-11-02 ENCOUNTER — Other Ambulatory Visit: Payer: Self-pay | Admitting: Family Medicine

## 2019-11-04 NOTE — Telephone Encounter (Signed)
Needs follow up   Not seen in one year   Refilled #[id number].

## 2019-11-04 NOTE — Telephone Encounter (Signed)
Please advise 

## 2019-11-06 ENCOUNTER — Telehealth: Payer: Self-pay

## 2019-11-06 MED ORDER — METOPROLOL SUCCINATE ER 100 MG PO TB24: 150.0000 mg | ORAL_TABLET | Freq: Every day | ORAL | 0 refills | Status: DC

## 2019-11-06 NOTE — Telephone Encounter (Signed)
Increase Metoprolol prescription sent to patients pharmacy, patient aware through my chart

## 2019-11-06 NOTE — Telephone Encounter (Signed)
Increase Toprol XL to 150 mg once daily  Please update her Rx with the pharmacy Richardson Dopp, PA-C    11/06/2019 1:33 PM

## 2019-11-13 ENCOUNTER — Ambulatory Visit: Payer: Medicare PPO | Admitting: Physician Assistant

## 2019-11-13 ENCOUNTER — Encounter: Payer: Self-pay | Admitting: Family Medicine

## 2019-11-13 ENCOUNTER — Other Ambulatory Visit: Payer: Self-pay | Admitting: Family Medicine

## 2019-11-19 NOTE — Progress Notes (Signed)
Cardiology Office Note:    Date:  11/20/2019   ID:  [name], DOB 02/20/1946, MRN [id number]  PCP:  Christina Post, MD  Cardiologist:  Christina Moores, MD  Electrophysiologist:  None   Referring MD: Christina Post, MD   Chief Complaint:  Follow-up (Atrial fibrillation, cardiomyopathy)    Patient Profile:    [name] is a 74 y.o. female with:   Persistent atrial fibrillation  ? S/p DCCV 11/2018 ? CHA2DS2-VASc=3 (female, age x 1, LV dysfunction)   Cardiomyopathy ? Echocardiogram 11/2018: EF 45-50, Gr 2 DD ? Echocardiogram 7/21: EF 40-45  Ovarian CA  Prior CV studies: Echocardiogram 10/24/19 EF 40-45, global HK, normal RVSF, mild BAE, mild MR  Echocardiogram 12/19/2018 EF 45-50, mild [name] LVH, Gr 2 DD, mild reduced RVSF, severe LAE, mild MAC, trace MR, trivial TR  History of Present Illness:    [name] was last seen in 08/2019.  She was back in atrial fibrillation.  I obtained a follow up echocardiogram and this demonstrated EF 40-45.  Her Diltiazem was DC'd and her beta-blocker dose was increased.  She returns to review her echocardiogram findings.    She is here alone.  She has been doing well.  She exercises almost daily.  She has not had chest discomfort, significant shortness of breath, orthopnea, leg swelling or syncope.  She is unaware that she is in atrial fibrillation.  Past Medical History:  Diagnosis Date  . Cancer ([place]) 2007   ovarian Ca stage III  . Cervicalgia 05/19/2009  . Chronic insomnia 12/29/2011  . Headache(784.0) 05/19/2009  . Hx of splenomegaly 03/27/2013   2007 At the time of definitive surgery for ovarian cancer metastatic to omentum ; her spleen was nicked & resected.  She maintains pneumonia immunization prophylaxis   . Kidney stones 01/09/2013   Calcium stones    . Lactic acidosis 04/08/2014  . NEOPLASM, MALIGNANT, OVARY, HX OF 06/30/2008  . Obesity (BMI 30-39.9) 01/09/2013  . Osteoporosis, unspecified 01/09/2013  .  Persistent atrial fibrillation ([place]) 11/06/2018   S/p cardioversion 11/2018 //anticoagulation with Apixaban //Echo 11/2018: EF 45-50, LVH, GRII DD, mildly reduced RV SF, severe LAE  . Septic shock ([place]) 04/08/2014  . SPRAIN AND STRAIN OF UNSPECIFIED SITE OF HAND 05/19/2009  . Urinary tract infection 04/08/2014  . VIRAL URI 03/06/2009  . WEIGHT GAIN 06/30/2008    Current Medications: Current Meds  Medication Sig  . ELIQUIS 5 MG TABS tablet TAKE 1 TABLET BY MOUTH TWICE A DAY  . Multiple Vitamin (MULTIVITAMIN) tablet Take 1 tablet by mouth daily.   . ondansetron (ZOFRAN ODT) 4 MG disintegrating tablet Take 1 tablet (4 mg total) by mouth every 8 (eight) hours as needed for nausea or vomiting.  Marland Kitchen zolpidem (AMBIEN) 10 MG tablet TAKE 1 TABLET BY MOUTH EVERY DAY AT BEDTIME AS NEEDED  . [DISCONTINUED] metoprolol succinate (TOPROL-XL) 100 MG 24 hr tablet Take 1.5 tablets (150 mg total) by mouth daily. Take with or immediately following a meal.     Allergies:   Morphine and related and Penicillins   Social History   Tobacco Use  . Smoking status: Never Smoker  . Smokeless tobacco: Never Used  Vaping Use  . Vaping Use: Never used  Substance Use Topics  . Alcohol use: No  . Drug use: No     Family Hx: The patient's family history includes Arthritis in her mother and another family member; Cancer in her sister; Cancer (age of onset: 56) in  her father; Diabetes in her sister.  ROS  See HPI  EKGs/Labs/Other Test Reviewed:    EKG:  EKG is  ordered today.  The ekg ordered today demonstrates atrial fibrillation, HR 76, normal axis, no significant ST-T wave changes  Recent Labs: 09/18/2019: BUN 16; Creatinine, Ser 0.99; Hemoglobin 14.5; Platelets 215; Potassium 5.1; Sodium 141   Recent Lipid Panel Lab Results  Component Value Date/Time   CHOL 148 12/23/2011 09:22 AM   TRIG 54.0 12/23/2011 09:22 AM   HDL 54.70 12/23/2011 09:22 AM   CHOLHDL 3 12/23/2011 09:22 AM   LDLCALC 83 12/23/2011 09:22  AM    Physical Exam:    VS:  BP 100/64   Pulse 76   Ht _0  (1.575 m)   Wt 158 lb (71.7 kg)   SpO2 96%   BMI 28.90 kg/m     Wt Readings from Last 3 Encounters:  11/20/19 158 lb (71.7 kg)  10/25/19 162 lb 12.8 oz (73.8 kg)  09/18/19 168 lb (76.2 kg)     Constitutional:      Appearance: Healthy appearance. Not in distress.  Neck:     Vascular: JVD normal.  Pulmonary:     Effort: Pulmonary effort is normal.     Breath sounds: No wheezing. No rales.  Cardiovascular:     Normal rate. Irregularly irregular rhythm. Normal S1. Normal S2.     Murmurs: There is no murmur.  Edema:    Peripheral edema absent.  Abdominal:     Palpations: Abdomen is soft.  Skin:    General: Skin is warm and dry.  Neurological:     Mental Status: Alert and oriented to person, place and time.     Cranial Nerves: Cranial nerves are intact.      ASSESSMENT & PLAN:    1. Persistent atrial fibrillation ([place]) She remains in atrial fibrillation.  Her heart rate is well controlled.  She is tolerating anticoagulation.  As she is asymptomatic, we will continue rate control strategy.  Continue current dose of apixaban and metoprolol succinate.  2. Other cardiomyopathy ([place]) 3. Family history of early CAD She likely has a nonischemic cardiomyopathy.  She has global hypokinesis on echocardiogram.  However, she does have several family members who had diagnosis of coronary artery disease of an early age.  Her heart rate is well controlled but too fast to obtain a coronary CTA.  I will set her up for a Lexiscan Myoview to rule out ischemic cardiomyopathy.  Her blood pressure will not tolerate the addition of an ACE inhibitor at this time.  We could certainly consider lisinopril 2.5 mg daily if her pressure is higher at future visits.     Dispo:  Return in about 6 months (around 05/22/2020) for Routine Follow Up, w/ Dr. Acie [name], or Christina Dopp, PA-C, in person.   Medication Adjustments/Labs and Tests  Ordered: Current medicines are reviewed at length with the patient today.  Concerns regarding medicines are outlined above.  Tests Ordered: Orders Placed This Encounter  Procedures  . MYOCARDIAL PERFUSION IMAGING  . EKG 12-Lead   Medication Changes: Meds ordered this encounter  Medications  . metoprolol succinate (TOPROL-XL) 100 MG 24 hr tablet    Sig: Take 1 tablet (100 mg total) by mouth daily. To take in addition with 50 mg Metoprolol Succinate to equal 150 mg    Dispense:  90 tablet    Refill:  3    Patient will call when she is ready to have this  filled  . metoprolol tartrate (LOPRESSOR) 50 MG tablet    Sig: Take 1 tablet (50 mg total) by mouth daily. To take in addition with 100 mg Metoprolol Succinate to equal 150 mg    Dispense:  90 tablet    Refill:  3    Patient will call when she is ready to have this filled.    Signed, Christina Dopp, PA-C  11/20/2019 12:04 PM    [facility] [address], [place], [place]  [address] Phone: [phone]; Fax: [phone]

## 2019-11-20 ENCOUNTER — Encounter: Payer: Self-pay | Admitting: Physician Assistant

## 2019-11-20 ENCOUNTER — Ambulatory Visit: Payer: Medicare PPO | Admitting: Physician Assistant

## 2019-11-20 ENCOUNTER — Other Ambulatory Visit: Payer: Self-pay

## 2019-11-20 VITALS — BP 100/64 | HR 76 | Ht 62.0 in | Wt 158.0 lb

## 2019-11-20 DIAGNOSIS — I428 Other cardiomyopathies: Secondary | ICD-10-CM | POA: Diagnosis not present

## 2019-11-20 DIAGNOSIS — Z8249 Family history of ischemic heart disease and other diseases of the circulatory system: Secondary | ICD-10-CM

## 2019-11-20 DIAGNOSIS — I4819 Other persistent atrial fibrillation: Secondary | ICD-10-CM

## 2019-11-20 MED ORDER — METOPROLOL SUCCINATE ER 100 MG PO TB24
100.0000 mg | ORAL_TABLET | Freq: Every day | ORAL | 3 refills | Status: DC
Start: 2019-11-20 — End: 2021-01-22

## 2019-11-20 MED ORDER — METOPROLOL TARTRATE 50 MG PO TABS: 50.0000 mg | ORAL_TABLET | Freq: Every day | ORAL | 3 refills | Status: DC

## 2019-11-20 NOTE — Patient Instructions (Signed)
Medication Instructions:  Your physician recommends that you continue on your current medications as directed. Please refer to the Current Medication list given to you today.  *If you need a refill on your cardiac medications before your next appointment, please call your pharmacy*  Lab Work: None ordered today  Testing/Procedures: Your physician has requested that you have a lexiscan myoview. For further information please visit [email or web address]. Please follow instruction sheet, as given.  Follow-Up: At [facility], you and your health needs are our priority.  As part of our continuing mission to provide you with exceptional heart care, we have created designated Provider Care Teams.  These Care Teams include your primary Cardiologist (physician) and Advanced Practice Providers (APPs -  Physician Assistants and Nurse Practitioners) who all work together to provide you with the care you need, when you need it.  Your next appointment:   6 month(s)  The format for your next appointment:   In Person  Provider:   You may see Mertie Moores, MD or Richardson Dopp, PA-C

## 2019-11-30 ENCOUNTER — Other Ambulatory Visit: Payer: Self-pay | Admitting: Family Medicine

## 2019-12-01 ENCOUNTER — Encounter: Payer: Self-pay | Admitting: Family Medicine

## 2019-12-04 MED ORDER — APIXABAN 5 MG PO TABS
5.0000 mg | ORAL_TABLET | Freq: Two times a day (BID) | ORAL | 0 refills | Status: DC
Start: 2019-12-04 — End: 2020-01-03

## 2019-12-04 NOTE — Telephone Encounter (Signed)
Last filled 09/18/2019 Last OV 11/23/2018  Ok to fill?

## 2019-12-18 ENCOUNTER — Encounter (HOSPITAL_COMMUNITY): Payer: Self-pay | Admitting: *Deleted

## 2019-12-18 ENCOUNTER — Telehealth (HOSPITAL_COMMUNITY): Payer: Self-pay | Admitting: *Deleted

## 2019-12-18 NOTE — Telephone Encounter (Signed)
Patient given detailed instructions per Myocardial Perfusion Study Information Sheet for the test on 12/25/2019 at 1000. Patient notified to arrive 15 minutes early and that it is imperative to arrive on time for appointment to keep from having the test rescheduled.  If you need to cancel or reschedule your appointment, please call the office within 24 hours of your appointment. . Patient verbalized understanding.[place] Mychart letter sent with instructions.

## 2019-12-18 NOTE — Telephone Encounter (Signed)
Error.Berit Raczkowski W  

## 2019-12-23 DIAGNOSIS — L819 Disorder of pigmentation, unspecified: Secondary | ICD-10-CM | POA: Diagnosis not present

## 2019-12-23 DIAGNOSIS — L814 Other melanin hyperpigmentation: Secondary | ICD-10-CM | POA: Diagnosis not present

## 2019-12-23 DIAGNOSIS — L821 Other seborrheic keratosis: Secondary | ICD-10-CM | POA: Diagnosis not present

## 2019-12-23 DIAGNOSIS — L570 Actinic keratosis: Secondary | ICD-10-CM | POA: Diagnosis not present

## 2019-12-23 DIAGNOSIS — D229 Melanocytic nevi, unspecified: Secondary | ICD-10-CM | POA: Diagnosis not present

## 2019-12-25 ENCOUNTER — Ambulatory Visit (HOSPITAL_COMMUNITY): Payer: Medicare PPO | Attending: Internal Medicine

## 2019-12-25 ENCOUNTER — Other Ambulatory Visit: Payer: Self-pay

## 2019-12-25 DIAGNOSIS — I428 Other cardiomyopathies: Secondary | ICD-10-CM

## 2019-12-25 LAB — MYOCARDIAL PERFUSION IMAGING
LV dias vol: 84 mL (ref 46–106)
LV sys vol: 41 mL
Peak HR: 100 {beats}/min
Rest HR: 67 {beats}/min
SDS: 0
SRS: 0
SSS: 0
TID: 1.06

## 2019-12-25 MED ORDER — REGADENOSON 0.4 MG/5ML IV SOLN
0.4000 mg | Freq: Once | INTRAVENOUS | Status: AC
  Administered 2019-12-25: 0.4 mg via INTRAVENOUS

## 2019-12-25 MED ORDER — TECHNETIUM TC 99M TETROFOSMIN IV KIT
11.0000 | PACK | Freq: Once | INTRAVENOUS | Status: AC | PRN
  Administered 2019-12-25: 11 via INTRAVENOUS
  Filled 2019-12-25: qty 11

## 2019-12-25 MED ORDER — TECHNETIUM TC 99M TETROFOSMIN IV KIT
30.7000 | PACK | Freq: Once | INTRAVENOUS | Status: AC | PRN
  Administered 2019-12-25: 30.7 via INTRAVENOUS
  Filled 2019-12-25: qty 31

## 2019-12-26 ENCOUNTER — Encounter: Payer: Self-pay | Admitting: Physician Assistant

## 2019-12-26 DIAGNOSIS — I428 Other cardiomyopathies: Secondary | ICD-10-CM | POA: Insufficient documentation

## 2020-01-01 ENCOUNTER — Encounter: Payer: Medicare PPO | Admitting: Family Medicine

## 2020-01-01 ENCOUNTER — Other Ambulatory Visit: Payer: Self-pay

## 2020-01-03 ENCOUNTER — Other Ambulatory Visit: Payer: Self-pay

## 2020-01-03 ENCOUNTER — Encounter: Payer: Self-pay | Admitting: Family Medicine

## 2020-01-03 ENCOUNTER — Ambulatory Visit (INDEPENDENT_AMBULATORY_CARE_PROVIDER_SITE_OTHER): Payer: Medicare PPO | Admitting: Family Medicine

## 2020-01-03 VITALS — BP 118/60 | HR 60 | Temp 97.8°F | Ht 62.0 in | Wt 154.4 lb

## 2020-01-03 DIAGNOSIS — I4891 Unspecified atrial fibrillation: Secondary | ICD-10-CM

## 2020-01-03 DIAGNOSIS — Z23 Encounter for immunization: Secondary | ICD-10-CM

## 2020-01-03 DIAGNOSIS — Z0000 Encounter for general adult medical examination without abnormal findings: Secondary | ICD-10-CM

## 2020-01-03 MED ORDER — ONDANSETRON 4 MG PO TBDP: 4.0000 mg | ORAL_TABLET | Freq: Three times a day (TID) | ORAL | 0 refills | Status: DC | PRN

## 2020-01-03 MED ORDER — ZOSTER VAC RECOMB ADJUVANTED 50 MCG/0.5ML IM SUSR: 0.5000 mL | Freq: Once | INTRAMUSCULAR | 1 refills | Status: AC

## 2020-01-03 MED ORDER — APIXABAN 5 MG PO TABS
5.0000 mg | ORAL_TABLET | Freq: Two times a day (BID) | ORAL | 5 refills | Status: DC
Start: 2020-01-03 — End: 2020-06-15

## 2020-01-03 NOTE — Patient Instructions (Signed)
Confirm if you got mammogram last April.  If not, would schedule  Consider Cologuard by next year  Consider Shingrix sometime within the next year.   Get [employer] booster.

## 2020-01-03 NOTE — Progress Notes (Signed)
Established Patient Office Visit  Subjective:  Patient ID: [name], female    DOB: March 23, 1946  Age: 74 y.o. MRN: [id number]  CC: No chief complaint on file.   HPI  [name] presents for physical exam.  Her chronic history is significant for history of ovarian cancer, history of kidney stones, osteoporosis, history of paroxysmal atrial fibrillation.  She came in last year with atrial fibrillation new onset with rapid ventricular response.  We referred her to cardiology.  She was started on Eliquis and now is on 150 mg of metoprolol.  That seems to be controlling her heart rate well.  She did have cardioversion procedure but went back in A. fib.  She is not having any issues currently with Eliquis.  She still sees GYN yearly they are coordinating her mammograms and apparently getting Pap smears.  She has done a tremendous job with weight loss this year through [employer].  She is walking 15,000 steps in about 5 miles per day.  She would like to lose about 20-30 more pounds.  She states she feels better than she has in years with her weight loss.  -Needs flu vaccine -Covid vaccines given and she plans to get booster soon -She has had previous pneumonia vaccines -Schedule repeat DEXA scan this November -She thought she had a mammogram last spring but we do not see records of that -Last colonoscopy 2009.  We sent Cologuard over a year ago but she never completed this.  She defers at this time.  She will consider Cologuard next year -She apparently had previous Zostavax.  She will consider Shingrix at some point this year  Past Medical History:  Diagnosis Date  . Cancer ([place]) 2007   ovarian Ca stage III  . Cervicalgia 05/19/2009  . Chronic insomnia 12/29/2011  . Headache(784.0) 05/19/2009  . Hx of splenomegaly 03/27/2013   2007 At the time of definitive surgery for ovarian cancer metastatic to omentum ; her spleen was nicked & resected.  She maintains pneumonia  immunization prophylaxis   . Kidney stones 01/09/2013   Calcium stones    . Lactic acidosis 04/08/2014  . NEOPLASM, MALIGNANT, OVARY, HX OF 06/30/2008  . Nonischemic cardiomyopathy    Echo 7/21: EF 40-45, global HK, normal RVSF, mild BAE, mild MR // Myoview 9/21: EF 52, no ischemia, low risk   . Obesity (BMI 30-39.9) 01/09/2013  . Osteoporosis, unspecified 01/09/2013  . Persistent atrial fibrillation ([place]) 11/06/2018   S/p cardioversion 11/2018 //anticoagulation with Apixaban //Echo 11/2018: EF 45-50, LVH, GRII DD, mildly reduced RV SF, severe LAE  . Septic shock ([place]) 04/08/2014  . SPRAIN AND STRAIN OF UNSPECIFIED SITE OF HAND 05/19/2009  . Urinary tract infection 04/08/2014  . VIRAL URI 03/06/2009  . WEIGHT GAIN 06/30/2008    Past Surgical History:  Procedure Laterality Date  . ABDOMINAL HYSTERECTOMY    . APPENDECTOMY    . CARDIOVERSION N/A 12/17/2018   Procedure: CARDIOVERSION;  Surgeon: Buford Dresser, MD;  Location: [facility] ENDOSCOPY;  Service: Cardiovascular;  Laterality: N/A;  . CESAREAN SECTION     x2  . CHOLECYSTECTOMY  2011  . GASTRIC BYPASS  1979  . INGUINAL HERNIA REPAIR    . ovarian cancer  2007  . RENAL CALCULI    . SPLENECTOMY  2007  . TRANSURETHRAL RESECTION OF BLADDER  2007   LESION    Family History  Problem Relation Age of Onset  . Arthritis Mother        ?osteoarthritis  .  Cancer Father 51       lymphoma  . Arthritis Other   . Cancer Sister        ovaian cancer  . Diabetes Sister        type 2     Social History   Socioeconomic History  . Marital status: Married    Spouse name: Not on file  . Number of children: Not on file  . Years of education: Not on file  . Highest education level: Not on file  Occupational History  . Not on file  Tobacco Use  . Smoking status: Never Smoker  . Smokeless tobacco: Never Used  Vaping Use  . Vaping Use: Never used  Substance and Sexual Activity  . Alcohol use: No  . Drug use: No  . Sexual activity: Yes     Partners: Male    Comment: 1ST intercourse- 4, partners- 25, married- 105 yrs   Other Topics Concern  . Not on file  Social History Narrative  . Not on file   Social Determinants of Health   Financial Resource Strain:   . Difficulty of Paying Living Expenses: Not on file  Food Insecurity:   . Worried About Charity fundraiser in the Last Year: Not on file  . Ran Out of Food in the Last Year: Not on file  Transportation Needs:   . Lack of Transportation (Medical): Not on file  . Lack of Transportation (Non-Medical): Not on file  Physical Activity:   . Days of Exercise per Week: Not on file  . Minutes of Exercise per Session: Not on file  Stress:   . Feeling of Stress : Not on file  Social Connections:   . Frequency of Communication with Friends and Family: Not on file  . Frequency of Social Gatherings with Friends and Family: Not on file  . Attends Religious Services: Not on file  . Active Member of Clubs or Organizations: Not on file  . Attends Archivist Meetings: Not on file  . Marital Status: Not on file  Intimate Partner Violence:   . Fear of Current or Ex-Partner: Not on file  . Emotionally Abused: Not on file  . Physically Abused: Not on file  . Sexually Abused: Not on file    Outpatient Medications Prior to Visit  Medication Sig Dispense Refill  . metoprolol succinate (TOPROL-XL) 100 MG 24 hr tablet Take 1 tablet (100 mg total) by mouth daily. To take in addition with 50 mg Metoprolol Succinate to equal 150 mg 90 tablet 3  . metoprolol tartrate (LOPRESSOR) 50 MG tablet Take 1 tablet (50 mg total) by mouth daily. To take in addition with 100 mg Metoprolol Succinate to equal 150 mg 90 tablet 3  . Multiple Vitamin (MULTIVITAMIN) tablet Take 1 tablet by mouth daily.     Marland Kitchen zolpidem (AMBIEN) 10 MG tablet TAKE 1 TABLET BY MOUTH EVERY DAY AT BEDTIME AS NEEDED 30 tablet 0  . ELIQUIS 5 MG TABS tablet TAKE 1 TABLET BY MOUTH TWICE A DAY 60 tablet 1  . ondansetron  (ZOFRAN ODT) 4 MG disintegrating tablet Take 1 tablet (4 mg total) by mouth every 8 (eight) hours as needed for nausea or vomiting. 10 tablet 0  . apixaban (ELIQUIS) 5 MG TABS tablet Take 1 tablet (5 mg total) by mouth 2 (two) times daily. 60 tablet 0   No facility-administered medications prior to visit.    Allergies  Allergen Reactions  . Morphine And Related Nausea  And Vomiting  . Penicillins Hives    Did it involve swelling of the face/tongue/throat, SOB, or low BP? No Did it involve sudden or severe rash/hives, skin peeling, or any reaction on the inside of your mouth or nose? No Did you need to seek medical attention at a hospital or doctor's office? Unknown When did it last happen?50 + years If all above answers are "NO", may proceed with cephalosporin use.     ROS Review of Systems  Constitutional: Negative for activity change, appetite change, fatigue, fever and unexpected weight change.  HENT: Negative for ear pain, hearing loss, sore throat and trouble swallowing.   Eyes: Negative for visual disturbance.  Respiratory: Negative for cough and shortness of breath.   Cardiovascular: Negative for chest pain and palpitations.  Gastrointestinal: Negative for abdominal pain, blood in stool, constipation and diarrhea.  Genitourinary: Negative for dysuria and hematuria.  Musculoskeletal: Negative for arthralgias, back pain and myalgias.  Skin: Negative for rash.  Neurological: Negative for dizziness, syncope and headaches.  Hematological: Negative for adenopathy.  Psychiatric/Behavioral: Negative for confusion and dysphoric mood.      Objective:    Physical Exam Constitutional:      Appearance: She is well-developed.  HENT:     Head: Normocephalic and atraumatic.  Eyes:     Pupils: Pupils are equal, round, and reactive to light.  Neck:     Thyroid: No thyromegaly.  Cardiovascular:     Rate and Rhythm: Normal rate and regular rhythm.     Heart sounds: Normal  heart sounds. No murmur heard.   Pulmonary:     Effort: No respiratory distress.     Breath sounds: Normal breath sounds. No wheezing or rales.  Abdominal:     General: Bowel sounds are normal. There is no distension.     Palpations: Abdomen is soft. There is no mass.     Tenderness: There is no abdominal tenderness. There is no guarding or rebound.  Musculoskeletal:        General: Normal range of motion.     Cervical back: Normal range of motion and neck supple.  Lymphadenopathy:     Cervical: No cervical adenopathy.  Skin:    Findings: No rash.  Neurological:     Mental Status: She is alert and oriented to person, place, and time.     Cranial Nerves: No cranial nerve deficit.     Deep Tendon Reflexes: Reflexes normal.  Psychiatric:        Behavior: Behavior normal.        Thought Content: Thought content normal.        Judgment: Judgment normal.     BP 118/60 (BP Location: Left Arm, Patient Position: Sitting, Cuff Size: Normal)   Pulse 60   Temp 97.8 F (36.6 C) (Oral)   Ht 5\' 2"  (1.575 m)   Wt 154 lb 6 oz (70 kg)   SpO2 98%   BMI 28.24 kg/m  Wt Readings from Last 3 Encounters:  01/03/20 154 lb 6 oz (70 kg)  12/25/19 158 lb (71.7 kg)  11/20/19 158 lb (71.7 kg)     Health Maintenance Due  Topic Date Due  . Hepatitis C Screening  Never done  . TETANUS/TDAP  03/29/2015  . COLONOSCOPY  12/28/2017  . MAMMOGRAM  07/01/2019    There are no preventive care reminders to display for this patient.  Lab Results  Component Value Date   TSH 3.20 10/10/2018   Lab Results  Component Value  Date   WBC 8.4 09/18/2019   HGB 14.5 09/18/2019   HCT 42.5 09/18/2019   MCV 98 (H) 09/18/2019   PLT 215 09/18/2019   Lab Results  Component Value Date   NA 141 09/18/2019   K 5.1 09/18/2019   CO2 22 09/18/2019   GLUCOSE 90 09/18/2019   BUN 16 09/18/2019   CREATININE 0.99 09/18/2019   BILITOT 0.7 10/10/2018   ALKPHOS 88 10/10/2018   AST 22 10/10/2018   ALT 17 10/10/2018     PROT 6.7 10/10/2018   ALBUMIN 4.1 10/10/2018   CALCIUM 9.6 09/18/2019   ANIONGAP 6 04/12/2014   GFR 42.61 (L) 10/10/2018   Lab Results  Component Value Date   CHOL 148 12/23/2011   Lab Results  Component Value Date   HDL 54.70 12/23/2011   Lab Results  Component Value Date   LDLCALC 83 12/23/2011   Lab Results  Component Value Date   TRIG 54.0 12/23/2011   Lab Results  Component Value Date   CHOLHDL 3 12/23/2011   No results found for: HGBA1C    Assessment & Plan:   Physical exam.  She has chronic problems as above.  We discussed several health maintenance issues as below  -Flu vaccine given -Consider Shingrix at some point this year -Due for follow-up colon cancer screening.  We sent Cologuard about a year ago which she did not complete.  She declines at this time but consider by next year -Needs follow-up mammogram.  She will look at getting this scheduled -Covid vaccines given and she plans to get booster soon -Continue weight loss efforts -DEXA scan follow-up scheduled per GYN for November  Meds ordered this encounter  Medications  . Zoster Vaccine Adjuvanted [facility]) injection    Sig: Inject 0.5 mLs into the muscle once for 1 dose.    Dispense:  0.5 mL    Refill:  1  . ondansetron (ZOFRAN ODT) 4 MG disintegrating tablet    Sig: Take 1 tablet (4 mg total) by mouth every 8 (eight) hours as needed for nausea or vomiting.    Dispense:  10 tablet    Refill:  0  . apixaban (ELIQUIS) 5 MG TABS tablet    Sig: Take 1 tablet (5 mg total) by mouth 2 (two) times daily.    Dispense:  60 tablet    Refill:  5    Follow-up: No follow-ups on file.    Christina Littler, MD

## 2020-01-13 ENCOUNTER — Other Ambulatory Visit: Payer: Self-pay | Admitting: Obstetrics & Gynecology

## 2020-01-13 DIAGNOSIS — Z1231 Encounter for screening mammogram for malignant neoplasm of breast: Secondary | ICD-10-CM

## 2020-01-23 DIAGNOSIS — M5432 Sciatica, left side: Secondary | ICD-10-CM | POA: Diagnosis not present

## 2020-01-23 DIAGNOSIS — M25562 Pain in left knee: Secondary | ICD-10-CM | POA: Diagnosis not present

## 2020-01-24 ENCOUNTER — Other Ambulatory Visit: Payer: Self-pay | Admitting: Family Medicine

## 2020-02-18 ENCOUNTER — Encounter: Payer: Self-pay | Admitting: Family Medicine

## 2020-02-19 ENCOUNTER — Encounter: Payer: Self-pay | Admitting: Family Medicine

## 2020-02-19 ENCOUNTER — Ambulatory Visit: Payer: Medicare PPO | Admitting: Family Medicine

## 2020-02-19 ENCOUNTER — Other Ambulatory Visit: Payer: Self-pay

## 2020-02-19 VITALS — BP 102/78 | HR 96 | Temp 98.1°F | Wt 149.7 lb

## 2020-02-19 DIAGNOSIS — R300 Dysuria: Secondary | ICD-10-CM

## 2020-02-19 LAB — POCT URINALYSIS DIPSTICK
Glucose, UA: NEGATIVE
Ketones, UA: NEGATIVE
Nitrite, UA: NEGATIVE
Protein, UA: NEGATIVE
Spec Grav, UA: 1.02 (ref 1.010–1.025)
Urobilinogen, UA: 0.2 E.U./dL
pH, UA: 6.5 (ref 5.0–8.0)

## 2020-02-19 MED ORDER — CEPHALEXIN 500 MG PO CAPS: 500.0000 mg | ORAL_CAPSULE | Freq: Three times a day (TID) | ORAL | 0 refills | Status: DC

## 2020-02-19 NOTE — Progress Notes (Signed)
Established Patient Office Visit  Subjective:  Patient ID: [name], female    DOB: 1945/11/19  Age: 74 y.o. MRN: [id number]  CC:  Chief Complaint  Patient presents with  . Urinary Tract Infection    HPI [name] presents for 2-day history of some suprapubic discomfort and dysuria. She had called yesterday. Denies any flank pain. No fevers or chills. No nausea or vomiting. She had noted yesterday that her urine looked darker than usual. She is not describing any gross hematuria. She has had urinary infections previously and had very similar symptoms. She has reported allergy to penicillin but has tolerated Keflex in the past without any difficulties other than occasional loose stools.  Past Medical History:  Diagnosis Date  . Cancer ([place]) 2007   ovarian Ca stage III  . Cervicalgia 05/19/2009  . Chronic insomnia 12/29/2011  . Headache(784.0) 05/19/2009  . Hx of splenomegaly 03/27/2013   2007 At the time of definitive surgery for ovarian cancer metastatic to omentum ; her spleen was nicked & resected.  She maintains pneumonia immunization prophylaxis   . Kidney stones 01/09/2013   Calcium stones    . Lactic acidosis 04/08/2014  . NEOPLASM, MALIGNANT, OVARY, HX OF 06/30/2008  . Nonischemic cardiomyopathy    Echo 7/21: EF 40-45, global HK, normal RVSF, mild BAE, mild MR // Myoview 9/21: EF 52, no ischemia, low risk   . Obesity (BMI 30-39.9) 01/09/2013  . Osteoporosis, unspecified 01/09/2013  . Persistent atrial fibrillation ([place]) 11/06/2018   S/p cardioversion 11/2018 //anticoagulation with Apixaban //Echo 11/2018: EF 45-50, LVH, GRII DD, mildly reduced RV SF, severe LAE  . Septic shock ([place]) 04/08/2014  . SPRAIN AND STRAIN OF UNSPECIFIED SITE OF HAND 05/19/2009  . Urinary tract infection 04/08/2014  . VIRAL URI 03/06/2009  . WEIGHT GAIN 06/30/2008    Past Surgical History:  Procedure Laterality Date  . ABDOMINAL HYSTERECTOMY    . APPENDECTOMY    . CARDIOVERSION N/A  12/17/2018   Procedure: CARDIOVERSION;  Surgeon: Buford Dresser, MD;  Location: [facility] ENDOSCOPY;  Service: Cardiovascular;  Laterality: N/A;  . CESAREAN SECTION     x2  . CHOLECYSTECTOMY  2011  . GASTRIC BYPASS  1979  . INGUINAL HERNIA REPAIR    . ovarian cancer  2007  . RENAL CALCULI    . SPLENECTOMY  2007  . TRANSURETHRAL RESECTION OF BLADDER  2007   LESION    Family History  Problem Relation Age of Onset  . Arthritis Mother        ?osteoarthritis  . Cancer Father 46       lymphoma  . Arthritis Other   . Cancer Sister        ovaian cancer  . Diabetes Sister        type 2     Social History   Socioeconomic History  . Marital status: Married    Spouse name: Not on file  . Number of children: Not on file  . Years of education: Not on file  . Highest education level: Not on file  Occupational History  . Not on file  Tobacco Use  . Smoking status: Never Smoker  . Smokeless tobacco: Never Used  Vaping Use  . Vaping Use: Never used  Substance and Sexual Activity  . Alcohol use: No  . Drug use: No  . Sexual activity: Yes    Partners: Male    Comment: 1ST intercourse- 89, partners- 65, married- 12 yrs   Other Topics  Concern  . Not on file  Social History Narrative  . Not on file   Social Determinants of Health   Financial Resource Strain:   . Difficulty of Paying Living Expenses: Not on file  Food Insecurity:   . Worried About Charity fundraiser in the Last Year: Not on file  . Ran Out of Food in the Last Year: Not on file  Transportation Needs:   . Lack of Transportation (Medical): Not on file  . Lack of Transportation (Non-Medical): Not on file  Physical Activity:   . Days of Exercise per Week: Not on file  . Minutes of Exercise per Session: Not on file  Stress:   . Feeling of Stress : Not on file  Social Connections:   . Frequency of Communication with Friends and Family: Not on file  . Frequency of Social Gatherings with Friends and Family: Not  on file  . Attends Religious Services: Not on file  . Active Member of Clubs or Organizations: Not on file  . Attends Archivist Meetings: Not on file  . Marital Status: Not on file  Intimate Partner Violence:   . Fear of Current or Ex-Partner: Not on file  . Emotionally Abused: Not on file  . Physically Abused: Not on file  . Sexually Abused: Not on file    Outpatient Medications Prior to Visit  Medication Sig Dispense Refill  . apixaban (ELIQUIS) 5 MG TABS tablet Take 1 tablet (5 mg total) by mouth 2 (two) times daily. 60 tablet 5  . metoprolol succinate (TOPROL-XL) 100 MG 24 hr tablet Take 1 tablet (100 mg total) by mouth daily. To take in addition with 50 mg Metoprolol Succinate to equal 150 mg 90 tablet 3  . metoprolol tartrate (LOPRESSOR) 50 MG tablet Take 1 tablet (50 mg total) by mouth daily. To take in addition with 100 mg Metoprolol Succinate to equal 150 mg 90 tablet 3  . Multiple Vitamin (MULTIVITAMIN) tablet Take 1 tablet by mouth daily.     . ondansetron (ZOFRAN ODT) 4 MG disintegrating tablet Take 1 tablet (4 mg total) by mouth every 8 (eight) hours as needed for nausea or vomiting. 10 tablet 0  . zolpidem (AMBIEN) 10 MG tablet TAKE 1 TABLET BY MOUTH EVERY DAY AT BEDTIME AS NEEDED 30 tablet 5   No facility-administered medications prior to visit.    Allergies  Allergen Reactions  . Morphine And Related Nausea And Vomiting  . Penicillins Hives    Did it involve swelling of the face/tongue/throat, SOB, or low BP? No Did it involve sudden or severe rash/hives, skin peeling, or any reaction on the inside of your mouth or nose? No Did you need to seek medical attention at a hospital or doctor's office? Unknown When did it last happen?50 + years If all above answers are "NO", may proceed with cephalosporin use.     ROS Review of Systems  Constitutional: Negative for chills and fever.  Gastrointestinal: Negative for nausea and vomiting.  Genitourinary:  Positive for dysuria. Negative for flank pain and hematuria.      Objective:    Physical Exam Vitals reviewed.  Constitutional:      General: She is not in acute distress.    Appearance: Normal appearance. She is not ill-appearing or toxic-appearing.  Cardiovascular:     Rate and Rhythm: Normal rate and regular rhythm.  Pulmonary:     Effort: Pulmonary effort is normal.     Breath sounds: Normal breath  sounds.  Neurological:     Mental Status: She is alert.     BP 102/78 (BP Location: Left Arm, Patient Position: Sitting, Cuff Size: Normal)   Pulse 96   Temp 98.1 F (36.7 C) (Oral)   Wt 149 lb 11.2 oz (67.9 kg)   SpO2 99%   BMI 27.38 kg/m  Wt Readings from Last 3 Encounters:  02/19/20 149 lb 11.2 oz (67.9 kg)  01/03/20 154 lb 6 oz (70 kg)  12/25/19 158 lb (71.7 kg)     Health Maintenance Due  Topic Date Due  . Hepatitis C Screening  Never done  . TETANUS/TDAP  03/29/2015  . COLONOSCOPY  12/28/2017  . MAMMOGRAM  07/01/2019    There are no preventive care reminders to display for this patient.  Lab Results  Component Value Date   TSH 3.20 10/10/2018   Lab Results  Component Value Date   WBC 8.4 09/18/2019   HGB 14.5 09/18/2019   HCT 42.5 09/18/2019   MCV 98 (H) 09/18/2019   PLT 215 09/18/2019   Lab Results  Component Value Date   NA 141 09/18/2019   K 5.1 09/18/2019   CO2 22 09/18/2019   GLUCOSE 90 09/18/2019   BUN 16 09/18/2019   CREATININE 0.99 09/18/2019   BILITOT 0.7 10/10/2018   ALKPHOS 88 10/10/2018   AST 22 10/10/2018   ALT 17 10/10/2018   PROT 6.7 10/10/2018   ALBUMIN 4.1 10/10/2018   CALCIUM 9.6 09/18/2019   ANIONGAP 6 04/12/2014   GFR 42.61 (L) 10/10/2018   Lab Results  Component Value Date   CHOL 148 12/23/2011   Lab Results  Component Value Date   HDL 54.70 12/23/2011   Lab Results  Component Value Date   LDLCALC 83 12/23/2011   Lab Results  Component Value Date   TRIG 54.0 12/23/2011   Lab Results  Component Value  Date   CHOLHDL 3 12/23/2011   No results found for: HGBA1C    Assessment & Plan:   Problem List Items Addressed This Visit    None    Visit Diagnoses    Dysuria    -  Primary   Relevant Orders   POCT urinalysis dipstick (Completed)   Urine Culture    -Urine culture sent -Continue to stay well-hydrated -Start Keflex 500 mg 3 times daily for 5 days pending culture results -If urine culture negative will need follow-up to reassess her hematuria  Meds ordered this encounter  Medications  . cephALEXin (KEFLEX) 500 MG capsule    Sig: Take 1 capsule (500 mg total) by mouth 3 (three) times daily.    Dispense:  15 capsule    Refill:  0    Follow-up: No follow-ups on file.    Carolann Littler, MD

## 2020-02-19 NOTE — Patient Instructions (Signed)

## 2020-02-21 LAB — URINE CULTURE
MICRO NUMBER:: 11244471
SPECIMEN QUALITY:: ADEQUATE

## 2020-02-23 ENCOUNTER — Encounter: Payer: Self-pay | Admitting: Family Medicine

## 2020-02-24 ENCOUNTER — Other Ambulatory Visit: Payer: Self-pay | Admitting: Family Medicine

## 2020-02-24 NOTE — Telephone Encounter (Signed)
See other mychart encounter.

## 2020-02-25 NOTE — Telephone Encounter (Signed)
Is patient still symptomatic?  5 days of antibiotic for uncomplicated cystitis should be sufficient.

## 2020-02-26 NOTE — Telephone Encounter (Signed)
Refill not appropriate at this time as it is a duplicate.

## 2020-03-16 ENCOUNTER — Other Ambulatory Visit: Payer: Self-pay

## 2020-03-16 ENCOUNTER — Ambulatory Visit
Admission: RE | Admit: 2020-03-16 | Discharge: 2020-03-16 | Disposition: A | Discharge status: Home / Self Care | Payer: Medicare PPO | Source: Ambulatory Visit | Attending: Obstetrics & Gynecology | Admitting: Obstetrics & Gynecology

## 2020-03-16 DIAGNOSIS — Z1231 Encounter for screening mammogram for malignant neoplasm of breast: Secondary | ICD-10-CM

## 2020-04-02 ENCOUNTER — Encounter: Payer: Self-pay | Admitting: Family Medicine

## 2020-04-06 ENCOUNTER — Ambulatory Visit: Payer: Medicare PPO | Admitting: Family Medicine

## 2020-04-06 ENCOUNTER — Encounter: Payer: Self-pay | Admitting: Family Medicine

## 2020-04-06 ENCOUNTER — Other Ambulatory Visit: Payer: Self-pay

## 2020-04-06 VITALS — BP 108/78 | HR 100 | Ht 62.0 in | Wt 147.0 lb

## 2020-04-06 DIAGNOSIS — R350 Frequency of micturition: Secondary | ICD-10-CM

## 2020-04-06 DIAGNOSIS — R300 Dysuria: Secondary | ICD-10-CM

## 2020-04-06 LAB — POCT URINALYSIS DIPSTICK
Bilirubin, UA: NEGATIVE
Glucose, UA: NEGATIVE
Ketones, UA: NEGATIVE
Nitrite, UA: POSITIVE
Protein, UA: NEGATIVE
Spec Grav, UA: 1.01 (ref 1.010–1.025)
Urobilinogen, UA: 0.2 E.U./dL
pH, UA: 6 (ref 5.0–8.0)

## 2020-04-06 MED ORDER — CEPHALEXIN 500 MG PO CAPS: 500.0000 mg | ORAL_CAPSULE | Freq: Three times a day (TID) | ORAL | 0 refills | Status: DC

## 2020-04-06 NOTE — Patient Instructions (Signed)
Urinary Tract Infection, Adult  A urinary tract infection (UTI) is an infection of any part of the urinary tract. The urinary tract includes the kidneys, ureters, bladder, and urethra. These organs make, store, and get rid of urine in the body. An upper UTI affects the ureters and kidneys. A lower UTI affects the bladder and urethra. What are the causes? Most urinary tract infections are caused by bacteria in your genital area around your urethra, where urine leaves your body. These bacteria grow and cause inflammation of your urinary tract. What increases the risk? You are more likely to develop this condition if:  You have a urinary catheter that stays in place.  You are not able to control when you urinate or have a bowel movement (incontinence).  You are female and you: ? Use a spermicide or diaphragm for birth control. ? Have low estrogen levels. ? Are pregnant.  You have certain genes that increase your risk.  You are sexually active.  You take antibiotic medicines.  You have a condition that causes your flow of urine to slow down, such as: ? An enlarged prostate, if you are female. ? Blockage in your urethra. ? A kidney stone. ? A nerve condition that affects your bladder control (neurogenic bladder). ? Not getting enough to drink, or not urinating often.  You have certain medical conditions, such as: ? Diabetes. ? A weak disease-fighting system (immunesystem). ? Sickle cell disease. ? Gout. ? Spinal cord injury. What are the signs or symptoms? Symptoms of this condition include:  Needing to urinate right away (urgency).  Frequent urination. This may include small amounts of urine each time you urinate.  Pain or burning with urination.  Blood in the urine.  Urine that smells bad or unusual.  Trouble urinating.  Cloudy urine.  Vaginal discharge, if you are female.  Pain in the abdomen or the lower back. You may also have:  Vomiting or a decreased  appetite.  Confusion.  Irritability or tiredness.  A fever or chills.  Diarrhea. The first symptom in older adults may be confusion. In some cases, they may not have any symptoms until the infection has worsened. How is this diagnosed? This condition is diagnosed based on your medical history and a physical exam. You may also have other tests, including:  Urine tests.  Blood tests.  Tests for STIs (sexually transmitted infections). If you have had more than one UTI, a cystoscopy or imaging studies may be done to determine the cause of the infections. How is this treated? Treatment for this condition includes:  Antibiotic medicine.  Over-the-counter medicines to treat discomfort.  Drinking enough water to stay hydrated. If you have frequent infections or have other conditions such as a kidney stone, you may need to see a health care provider who specializes in the urinary tract (urologist). In rare cases, urinary tract infections can cause sepsis. Sepsis is a life-threatening condition that occurs when the body responds to an infection. Sepsis is treated in the hospital with IV antibiotics, fluids, and other medicines. Follow these instructions at home: Medicines  Take over-the-counter and prescription medicines only as told by your health care provider.  If you were prescribed an antibiotic medicine, take it as told by your health care provider. Do not stop using the antibiotic even if you start to feel better. General instructions  Make sure you: ? Empty your bladder often and completely. Do not hold urine for long periods of time. ? Empty your bladder after   sex. ? Wipe from front to back after urinating or having a bowel movement if you are female. Use each tissue only one time when you wipe.  Drink enough fluid to keep your urine pale yellow.  Keep all follow-up visits. This is important.   Contact a health care provider if:  Your symptoms do not get better after 1-2  days.  Your symptoms go away and then return. Get help right away if:  You have severe pain in your back or your lower abdomen.  You have a fever or chills.  You have nausea or vomiting. Summary  A urinary tract infection (UTI) is an infection of any part of the urinary tract, which includes the kidneys, ureters, bladder, and urethra.  Most urinary tract infections are caused by bacteria in your genital area.  Treatment for this condition often includes antibiotic medicines.  If you were prescribed an antibiotic medicine, take it as told by your health care provider. Do not stop using the antibiotic even if you start to feel better.  Keep all follow-up visits. This is important. This information is not intended to replace advice given to you by your health care provider. Make sure you discuss any questions you have with your health care provider. Document Revised: 10/25/2019 Document Reviewed: 10/25/2019 Elsevier Patient Education  2021 Elsevier Inc.  

## 2020-04-06 NOTE — Progress Notes (Signed)
Established Patient Office Visit  Subjective:  Patient ID: [name], female    DOB: [birth year]  Age: 75 y.o. MRN: [id number]  CC:  Chief Complaint  Patient presents with  . Urinary Frequency    HPI [name] presents for possible UTI.  She actually had Klebsiella UTI back in November.  She states she developed some burning with urination along with itching and frequency last Thursday.  She was unable to get in for an appointment and started some over-the-counter Monistat wondering whether she may have yeast infection.  Her itching has resolved.  Her burning has resolved as well.  Still has some mild frequency.  She did have some recent diarrhea which she attributed to eating fatty foods and richer foods over the holidays.  She has tighten her diet back up at this point.  No recent fever.  No gross hematuria.  No vaginal discharge  Past Medical History:  Diagnosis Date  . Cancer ([place]) 2007   ovarian Ca stage III  . Cervicalgia 05/19/2009  . Chronic insomnia 12/29/2011  . Headache(784.0) 05/19/2009  . Hx of splenomegaly 03/27/2013   2007 At the time of definitive surgery for ovarian cancer metastatic to omentum ; her spleen was nicked & resected.  She maintains pneumonia immunization prophylaxis   . Kidney stones 01/09/2013   Calcium stones    . Lactic acidosis 04/08/2014  . NEOPLASM, MALIGNANT, OVARY, HX OF 06/30/2008  . Nonischemic cardiomyopathy    Echo 7/21: EF 40-45, global HK, normal RVSF, mild BAE, mild MR // Myoview 9/21: EF 52, no ischemia, low risk   . Obesity (BMI 30-39.9) 01/09/2013  . Osteoporosis, unspecified 01/09/2013  . Persistent atrial fibrillation ([place]) 11/06/2018   S/p cardioversion 11/2018 //anticoagulation with Apixaban //Echo 11/2018: EF 45-50, LVH, GRII DD, mildly reduced RV SF, severe LAE  . Septic shock ([place]) 04/08/2014  . SPRAIN AND STRAIN OF UNSPECIFIED SITE OF HAND 05/19/2009  . Urinary tract infection 04/08/2014  . VIRAL URI 03/06/2009  .  WEIGHT GAIN 06/30/2008    Past Surgical History:  Procedure Laterality Date  . ABDOMINAL HYSTERECTOMY    . APPENDECTOMY    . CARDIOVERSION N/A 12/17/2018   Procedure: CARDIOVERSION;  Surgeon: Buford Dresser, MD;  Location: [facility] ENDOSCOPY;  Service: Cardiovascular;  Laterality: N/A;  . CESAREAN SECTION     x2  . CHOLECYSTECTOMY  2011  . GASTRIC BYPASS  1979  . INGUINAL HERNIA REPAIR    . ovarian cancer  2007  . RENAL CALCULI    . SPLENECTOMY  2007  . TRANSURETHRAL RESECTION OF BLADDER  2007   LESION    Family History  Problem Relation Age of Onset  . Arthritis Mother        ?osteoarthritis  . Cancer Father 56       lymphoma  . Arthritis Other   . Cancer Sister        ovaian cancer  . Diabetes Sister        type 2     Social History   Socioeconomic History  . Marital status: Married    Spouse name: Not on file  . Number of children: Not on file  . Years of education: Not on file  . Highest education level: Not on file  Occupational History  . Not on file  Tobacco Use  . Smoking status: Never Smoker  . Smokeless tobacco: Never Used  Vaping Use  . Vaping Use: Never used  Substance and Sexual Activity  .  Alcohol use: No  . Drug use: No  . Sexual activity: Yes    Partners: Male    Comment: 1ST intercourse- 33, partners- 33, married- 69 yrs   Other Topics Concern  . Not on file  Social History Narrative  . Not on file   Social Determinants of Health   Financial Resource Strain: Not on file  Food Insecurity: Not on file  Transportation Needs: Not on file  Physical Activity: Not on file  Stress: Not on file  Social Connections: Not on file  Intimate Partner Violence: Not on file    Outpatient Medications Prior to Visit  Medication Sig Dispense Refill  . apixaban (ELIQUIS) 5 MG TABS tablet Take 1 tablet (5 mg total) by mouth 2 (two) times daily. 60 tablet 5  . metoprolol succinate (TOPROL-XL) 100 MG 24 hr tablet Take 1 tablet (100 mg total) by mouth  daily. To take in addition with 50 mg Metoprolol Succinate to equal 150 mg 90 tablet 3  . metoprolol tartrate (LOPRESSOR) 50 MG tablet Take 1 tablet (50 mg total) by mouth daily. To take in addition with 100 mg Metoprolol Succinate to equal 150 mg 90 tablet 3  . Multiple Vitamin (MULTIVITAMIN) tablet Take 1 tablet by mouth daily.     . ondansetron (ZOFRAN ODT) 4 MG disintegrating tablet Take 1 tablet (4 mg total) by mouth every 8 (eight) hours as needed for nausea or vomiting. 10 tablet 0  . zolpidem (AMBIEN) 10 MG tablet TAKE 1 TABLET BY MOUTH EVERY DAY AT BEDTIME AS NEEDED 30 tablet 5  . cephALEXin (KEFLEX) 500 MG capsule Take 1 capsule (500 mg total) by mouth 3 (three) times daily. 15 capsule 0   No facility-administered medications prior to visit.    Allergies  Allergen Reactions  . Morphine And Related Nausea And Vomiting  . Penicillins Hives    Did it involve swelling of the face/tongue/throat, SOB, or low BP? No Did it involve sudden or severe rash/hives, skin peeling, or any reaction on the inside of your mouth or nose? No Did you need to seek medical attention at a hospital or doctor's office? Unknown When did it last happen?50 + years If all above answers are "NO", may proceed with cephalosporin use.     ROS Review of Systems  Constitutional: Negative for chills and fever.  Gastrointestinal: Negative for abdominal pain.  Genitourinary: Positive for dysuria and frequency. Negative for hematuria.      Objective:    Physical Exam Vitals reviewed.  Cardiovascular:     Rate and Rhythm: Normal rate.     Comments: Irregular rhythm consistent with her chronic atrial fibrillation Pulmonary:     Effort: Pulmonary effort is normal.     Breath sounds: Normal breath sounds.  Musculoskeletal:     Right lower leg: No edema.     Left lower leg: No edema.  Neurological:     Mental Status: She is alert.     BP 108/78   Pulse 100   Ht 5\' 2"  (1.575 m)   Wt 147 lb (66.7  kg)   SpO2 99%   BMI 26.89 kg/m  Wt Readings from Last 3 Encounters:  04/06/20 147 lb (66.7 kg)  02/19/20 149 lb 11.2 oz (67.9 kg)  01/03/20 154 lb 6 oz (70 kg)     Health Maintenance Due  Topic Date Due  . Hepatitis C Screening  Never done  . TETANUS/TDAP  03/29/2015  . COLONOSCOPY (Pts 45-60yrs Insurance coverage will  need to be confirmed)  12/28/2017    There are no preventive care reminders to display for this patient.  Lab Results  Component Value Date   TSH 3.20 10/10/2018   Lab Results  Component Value Date   WBC 8.4 09/18/2019   HGB 14.5 09/18/2019   HCT 42.5 09/18/2019   MCV 98 (H) 09/18/2019   PLT 215 09/18/2019   Lab Results  Component Value Date   NA 141 09/18/2019   K 5.1 09/18/2019   CO2 22 09/18/2019   GLUCOSE 90 09/18/2019   BUN 16 09/18/2019   CREATININE 0.99 09/18/2019   BILITOT 0.7 10/10/2018   ALKPHOS 88 10/10/2018   AST 22 10/10/2018   ALT 17 10/10/2018   PROT 6.7 10/10/2018   ALBUMIN 4.1 10/10/2018   CALCIUM 9.6 09/18/2019   ANIONGAP 6 04/12/2014   GFR 42.61 (L) 10/10/2018   Lab Results  Component Value Date   CHOL 148 12/23/2011   Lab Results  Component Value Date   HDL 54.70 12/23/2011   Lab Results  Component Value Date   LDLCALC 83 12/23/2011   Lab Results  Component Value Date   TRIG 54.0 12/23/2011   Lab Results  Component Value Date   CHOLHDL 3 12/23/2011   No results found for: HGBA1C    Assessment & Plan:   Problem List Items Addressed This Visit   None   Visit Diagnoses    Dysuria    -  Primary   Relevant Orders   POCT urinalysis dipstick   Urinary frequency       Relevant Orders   POCT urinalysis dipstick      Dipstick does suggest possible infection.  Urine culture sent. Stay well-hydrated.  Start back Keflex 500 mg 3 times daily for 7 days pending culture results.  Follow-up promptly for any worsening symptoms  No orders of the defined types were placed in this encounter.   Follow-up: No  follow-ups on file.    Carolann Littler, MD

## 2020-04-07 DIAGNOSIS — R350 Frequency of micturition: Secondary | ICD-10-CM | POA: Diagnosis not present

## 2020-04-07 DIAGNOSIS — R300 Dysuria: Secondary | ICD-10-CM | POA: Diagnosis not present

## 2020-04-08 LAB — URINE CULTURE
MICRO NUMBER:: 11405032
Result:: NO GROWTH
SPECIMEN QUALITY:: ADEQUATE

## 2020-04-22 DIAGNOSIS — M25572 Pain in left ankle and joints of left foot: Secondary | ICD-10-CM | POA: Diagnosis not present

## 2020-04-23 ENCOUNTER — Telehealth: Payer: Self-pay | Admitting: Family Medicine

## 2020-04-23 NOTE — Telephone Encounter (Signed)
Left message for patient to call back and schedule Medicare Annual Wellness Visit (AWV) either virtually or in office.   Last AWV no information please schedule at anytime with [facility] Nurse Health Advisor 1 or 2   This should be a 45 minute visit. 

## 2020-05-13 ENCOUNTER — Encounter: Payer: Self-pay | Admitting: Family Medicine

## 2020-05-13 ENCOUNTER — Other Ambulatory Visit: Payer: Self-pay

## 2020-05-13 ENCOUNTER — Ambulatory Visit: Payer: Medicare PPO | Admitting: Family Medicine

## 2020-05-13 VITALS — BP 118/70 | HR 91 | Ht 62.0 in | Wt 152.0 lb

## 2020-05-13 DIAGNOSIS — Z87442 Personal history of urinary calculi: Secondary | ICD-10-CM | POA: Diagnosis not present

## 2020-05-13 DIAGNOSIS — I480 Paroxysmal atrial fibrillation: Secondary | ICD-10-CM

## 2020-05-13 DIAGNOSIS — R319 Hematuria, unspecified: Secondary | ICD-10-CM

## 2020-05-13 DIAGNOSIS — M25572 Pain in left ankle and joints of left foot: Secondary | ICD-10-CM | POA: Diagnosis not present

## 2020-05-13 LAB — POCT URINALYSIS DIPSTICK
Bilirubin, UA: NEGATIVE
Glucose, UA: NEGATIVE
Ketones, UA: NEGATIVE
Leukocytes, UA: NEGATIVE
Nitrite, UA: NEGATIVE
Protein, UA: NEGATIVE
Spec Grav, UA: <=1.005 — AB (ref 1.010–1.025)
Urobilinogen, UA: NEGATIVE E.U./dL — AB
pH, UA: 5 (ref 5.0–8.0)

## 2020-05-13 NOTE — Progress Notes (Signed)
Established Patient Office Visit  Subjective:  Patient ID: [name], female    DOB: [birth year]  Age: 75 y.o. MRN: [id number]  CC:  Chief Complaint  Patient presents with  . Hematuria    HPI [name] presents for intermittent gross hematuria past few weeks.  She has known history of kidney stones and also takes Eliquis.  She denies any recent burning with urination.  Symptoms seem to be worse early in the morning with first urination and then clear some through the day.  No recent flank pain.  No abdominal pain.  No suprapubic pain.  No fevers or chills.  She had recent culture done back in early January which came back negative.  She has seen urology in the past.  She does not recall ever having cystoscopy.  She did have some mild dysuria symptoms when she was seen several weeks ago but none now.  Her main issue now is the intermittent gross hematuria.  Denies any appetite changes or any unexpected weight loss.  Past Medical History:  Diagnosis Date  . Cancer ([place]) 2007   ovarian Ca stage III  . Cervicalgia 05/19/2009  . Chronic insomnia 12/29/2011  . Headache(784.0) 05/19/2009  . Hx of splenomegaly 03/27/2013   2007 At the time of definitive surgery for ovarian cancer metastatic to omentum ; her spleen was nicked & resected.  She maintains pneumonia immunization prophylaxis   . Kidney stones 01/09/2013   Calcium stones    . Lactic acidosis 04/08/2014  . NEOPLASM, MALIGNANT, OVARY, HX OF 06/30/2008  . Nonischemic cardiomyopathy    Echo 7/21: EF 40-45, global HK, normal RVSF, mild BAE, mild MR // Myoview 9/21: EF 52, no ischemia, low risk   . Obesity (BMI 30-39.9) 01/09/2013  . Osteoporosis, unspecified 01/09/2013  . Persistent atrial fibrillation ([place]) 11/06/2018   S/p cardioversion 11/2018 //anticoagulation with Apixaban //Echo 11/2018: EF 45-50, LVH, GRII DD, mildly reduced RV SF, severe LAE  . Septic shock ([place]) 04/08/2014  . SPRAIN AND STRAIN OF UNSPECIFIED SITE OF  HAND 05/19/2009  . Urinary tract infection 04/08/2014  . VIRAL URI 03/06/2009  . WEIGHT GAIN 06/30/2008    Past Surgical History:  Procedure Laterality Date  . ABDOMINAL HYSTERECTOMY    . APPENDECTOMY    . CARDIOVERSION N/A 12/17/2018   Procedure: CARDIOVERSION;  Surgeon: Buford Dresser, MD;  Location: [facility] ENDOSCOPY;  Service: Cardiovascular;  Laterality: N/A;  . CESAREAN SECTION     x2  . CHOLECYSTECTOMY  2011  . GASTRIC BYPASS  1979  . INGUINAL HERNIA REPAIR    . ovarian cancer  2007  . RENAL CALCULI    . SPLENECTOMY  2007  . TRANSURETHRAL RESECTION OF BLADDER  2007   LESION    Family History  Problem Relation Age of Onset  . Arthritis Mother        ?osteoarthritis  . Cancer Father 2       lymphoma  . Arthritis Other   . Cancer Sister        ovaian cancer  . Diabetes Sister        type 2     Social History   Socioeconomic History  . Marital status: Married    Spouse name: Not on file  . Number of children: Not on file  . Years of education: Not on file  . Highest education level: Not on file  Occupational History  . Not on file  Tobacco Use  . Smoking status: Never Smoker  .  Smokeless tobacco: Never Used  Vaping Use  . Vaping Use: Never used  Substance and Sexual Activity  . Alcohol use: No  . Drug use: No  . Sexual activity: Yes    Partners: Male    Comment: 1ST intercourse- 7, partners- 40, married- 26 yrs   Other Topics Concern  . Not on file  Social History Narrative  . Not on file   Social Determinants of Health   Financial Resource Strain: Not on file  Food Insecurity: Not on file  Transportation Needs: Not on file  Physical Activity: Not on file  Stress: Not on file  Social Connections: Not on file  Intimate Partner Violence: Not on file    Outpatient Medications Prior to Visit  Medication Sig Dispense Refill  . apixaban (ELIQUIS) 5 MG TABS tablet Take 1 tablet (5 mg total) by mouth 2 (two) times daily. 60 tablet 5  . metoprolol  succinate (TOPROL-XL) 100 MG 24 hr tablet Take 1 tablet (100 mg total) by mouth daily. To take in addition with 50 mg Metoprolol Succinate to equal 150 mg 90 tablet 3  . metoprolol tartrate (LOPRESSOR) 50 MG tablet Take 1 tablet (50 mg total) by mouth daily. To take in addition with 100 mg Metoprolol Succinate to equal 150 mg 90 tablet 3  . Multiple Vitamin (MULTIVITAMIN) tablet Take 1 tablet by mouth daily.     . ondansetron (ZOFRAN ODT) 4 MG disintegrating tablet Take 1 tablet (4 mg total) by mouth every 8 (eight) hours as needed for nausea or vomiting. 10 tablet 0  . zolpidem (AMBIEN) 10 MG tablet TAKE 1 TABLET BY MOUTH EVERY DAY AT BEDTIME AS NEEDED 30 tablet 5  . cephALEXin (KEFLEX) 500 MG capsule Take 1 capsule (500 mg total) by mouth 3 (three) times daily. 21 capsule 0   No facility-administered medications prior to visit.    Allergies  Allergen Reactions  . Morphine And Related Nausea And Vomiting  . Penicillins Hives    Did it involve swelling of the face/tongue/throat, SOB, or low BP? No Did it involve sudden or severe rash/hives, skin peeling, or any reaction on the inside of your mouth or nose? No Did you need to seek medical attention at a hospital or doctor's office? Unknown When did it last happen?50 + years If all above answers are "NO", may proceed with cephalosporin use.     ROS Review of Systems  Constitutional: Negative for appetite change, chills, fever and unexpected weight change.  Respiratory: Negative for shortness of breath.   Genitourinary: Positive for hematuria. Negative for difficulty urinating and dysuria.  Hematological: Does not bruise/bleed easily.      Objective:    Physical Exam Vitals reviewed.  Constitutional:      Appearance: Normal appearance.  Cardiovascular:     Comments: Irregular rhythm consistent with her A. fib Pulmonary:     Effort: Pulmonary effort is normal.     Breath sounds: Normal breath sounds.  Musculoskeletal:      Cervical back: Neck supple.     Comments: No flank tenderness  Neurological:     Mental Status: She is alert.     BP 118/70   Pulse 91   Ht 5\' 2"  (1.575 m)   Wt 152 lb (68.9 kg)   BMI 27.80 kg/m  Wt Readings from Last 3 Encounters:  05/13/20 152 lb (68.9 kg)  04/06/20 147 lb (66.7 kg)  02/19/20 149 lb 11.2 oz (67.9 kg)     Health Maintenance  Due  Topic Date Due  . Hepatitis C Screening  Never done  . TETANUS/TDAP  03/29/2015  . COLONOSCOPY (Pts 45-56yrs Insurance coverage will need to be confirmed)  12/28/2017    There are no preventive care reminders to display for this patient.  Lab Results  Component Value Date   TSH 3.20 10/10/2018   Lab Results  Component Value Date   WBC 8.4 09/18/2019   HGB 14.5 09/18/2019   HCT 42.5 09/18/2019   MCV 98 (H) 09/18/2019   PLT 215 09/18/2019   Lab Results  Component Value Date   NA 141 09/18/2019   K 5.1 09/18/2019   CO2 22 09/18/2019   GLUCOSE 90 09/18/2019   BUN 16 09/18/2019   CREATININE 0.99 09/18/2019   BILITOT 0.7 10/10/2018   ALKPHOS 88 10/10/2018   AST 22 10/10/2018   ALT 17 10/10/2018   PROT 6.7 10/10/2018   ALBUMIN 4.1 10/10/2018   CALCIUM 9.6 09/18/2019   ANIONGAP 6 04/12/2014   GFR 42.61 (L) 10/10/2018   Lab Results  Component Value Date   CHOL 148 12/23/2011   Lab Results  Component Value Date   HDL 54.70 12/23/2011   Lab Results  Component Value Date   LDLCALC 83 12/23/2011   Lab Results  Component Value Date   TRIG 54.0 12/23/2011   Lab Results  Component Value Date   CHOLHDL 3 12/23/2011   No results found for: HGBA1C    Assessment & Plan:   Patient relates few week history of intermittent painless hematuria.  Does have past history of kidney stones and certainly could be related to that.  She is on Eliquis which increases her risk of bleeding.  Urine dipstick today shows only blood.  Recent culture negative.  Needs further evaluation.  We explained that even though she is high  risk because of Eliquis and has had prior history of kidney stones need to rule out other potential etiologies such as malignancy  -Set up urology referral -Would maintain the Eliquis at this time and lesser bleeding becomes extremely heavy -Continue to stay well-hydrated  No orders of the defined types were placed in this encounter.   Follow-up: No follow-ups on file.    Carolann Littler, MD

## 2020-05-13 NOTE — Patient Instructions (Signed)

## 2020-05-18 DIAGNOSIS — R310 Gross hematuria: Secondary | ICD-10-CM | POA: Diagnosis not present

## 2020-05-24 ENCOUNTER — Encounter: Payer: Self-pay | Admitting: Cardiovascular Disease

## 2020-05-24 NOTE — Progress Notes (Signed)
Cardiology Office Note:    Date:  05/25/2020   ID:  [name], DOB [birth year], MRN [id number]  PCP:  [name], MD  Cardiologist:   Amaree Loisel Electrophysiologist:  None   Referring MD: [name], MD   Chief Complaint  Patient presents with  . Atrial Fibrillation    Sept. 15, 2020   [name] is a 75 y.o. female with a recent diagnosis of atrial fib with RVR . We were asked to see her today by Dr. Elease Hashimoto for further evaluation of her atrial fib.   Cannot tell when she is in atrial fib or now She had AF with RVR  She was found to have AF RVR.    She found her rapid HR when she put on a pulse oxymeter and found her HR to be 160 .  Noticed palpitations when she was busy taking care of family who was visiting her from out of town.  Is also on Eliquis .  She is feeling better.   Is an ovarian cancer surviver.   Has neuropathy from chemo .   March 13, 2019:  [name] is seen today for follow-up visit for her atrial fibrillation.  She had a cardioversion on December 17, 2018. She feels much better after the cardioversion. She is gained a little bit of weight since we last saw her. Wt is 190  ( up 8 lbs from Sept, 2020)  Knows she needs to walk more   Feb. 28, 2022: [name] is seen today for follow up of her atrial fib  She was seen by Richardson Dopp, [place] in Aug. 2021. Wt today is 149 lbs ,  Feeling much better. Has had some ankle issues,  Cannot wak,  Bought a stationary bike  Has had some hematura.  Will likely need a cystoscopy .  She may hold her Eliquis for 2 days prior to cystoscopy    Past Medical History:  Diagnosis Date  . Cancer ([place]) 2007   ovarian Ca stage III  . Cervicalgia 05/19/2009  . Chronic insomnia 12/29/2011  . Headache(784.0) 05/19/2009  . Hx of splenomegaly 03/27/2013   2007 At the time of definitive surgery for ovarian cancer metastatic to omentum ; her spleen was nicked & resected.  She maintains pneumonia  immunization prophylaxis   . Kidney stones 01/09/2013   Calcium stones    . Lactic acidosis 04/08/2014  . NEOPLASM, MALIGNANT, OVARY, HX OF 06/30/2008  . Nonischemic cardiomyopathy    Echo 7/21: EF 40-45, global HK, normal RVSF, mild BAE, mild MR // Myoview 9/21: EF 52, no ischemia, low risk   . Obesity (BMI 30-39.9) 01/09/2013  . Osteoporosis, unspecified 01/09/2013  . Persistent atrial fibrillation ([place]) 11/06/2018   S/p cardioversion 11/2018 //anticoagulation with Apixaban //Echo 11/2018: EF 45-50, LVH, GRII DD, mildly reduced RV SF, severe LAE  . Septic shock ([place]) 04/08/2014  . SPRAIN AND STRAIN OF UNSPECIFIED SITE OF HAND 05/19/2009  . Urinary tract infection 04/08/2014  . VIRAL URI 03/06/2009  . WEIGHT GAIN 06/30/2008    Past Surgical History:  Procedure Laterality Date  . ABDOMINAL HYSTERECTOMY    . APPENDECTOMY    . CARDIOVERSION N/A 12/17/2018   Procedure: CARDIOVERSION;  Surgeon: Buford Dresser, MD;  Location: [facility] ENDOSCOPY;  Service: Cardiovascular;  Laterality: N/A;  . CESAREAN SECTION     x2  . CHOLECYSTECTOMY  2011  . GASTRIC BYPASS  1979  . INGUINAL HERNIA REPAIR    . ovarian cancer  2007  .  RENAL CALCULI    . SPLENECTOMY  2007  . TRANSURETHRAL RESECTION OF BLADDER  2007   LESION    Current Medications: Current Meds  Medication Sig  . apixaban (ELIQUIS) 5 MG TABS tablet Take 1 tablet (5 mg total) by mouth 2 (two) times daily.  . metoprolol succinate (TOPROL-XL) 100 MG 24 hr tablet Take 1 tablet (100 mg total) by mouth daily. To take in addition with 50 mg Metoprolol Succinate to equal 150 mg  . metoprolol tartrate (LOPRESSOR) 50 MG tablet Take 1 tablet (50 mg total) by mouth daily. To take in addition with 100 mg Metoprolol Succinate to equal 150 mg  . Multiple Vitamin (MULTIVITAMIN) tablet Take 1 tablet by mouth daily.   . ondansetron (ZOFRAN ODT) 4 MG disintegrating tablet Take 1 tablet (4 mg total) by mouth every 8 (eight) hours as needed for nausea or  vomiting.  Marland Kitchen zolpidem (AMBIEN) 10 MG tablet TAKE 1 TABLET BY MOUTH EVERY DAY AT BEDTIME AS NEEDED     Allergies:   Morphine and related and Penicillins   Social History   Socioeconomic History  . Marital status: Married    Spouse name: Not on file  . Number of children: Not on file  . Years of education: Not on file  . Highest education level: Not on file  Occupational History  . Not on file  Tobacco Use  . Smoking status: Never Smoker  . Smokeless tobacco: Never Used  Vaping Use  . Vaping Use: Never used  Substance and Sexual Activity  . Alcohol use: No  . Drug use: No  . Sexual activity: Yes    Partners: Male    Comment: 1ST intercourse- 89, partners- 3, married- 8 yrs   Other Topics Concern  . Not on file  Social History Narrative  . Not on file   Social Determinants of Health   Financial Resource Strain: Not on file  Food Insecurity: Not on file  Transportation Needs: Not on file  Physical Activity: Not on file  Stress: Not on file  Social Connections: Not on file     Family History: The patient's family history includes Arthritis in her mother and another family member; Cancer in her sister; Cancer (age of onset: 69) in her father; Diabetes in her sister.  ROS:   Please see the history of present illness.     All other systems reviewed and are negative.  EKGs/Labs/Other Studies Reviewed:    The following studies were reviewed today:   EKG:   Recent Labs: 09/18/2019: BUN 16; Creatinine, Ser 0.99; Hemoglobin 14.5; Platelets 215; Potassium 5.1; Sodium 141  Recent Lipid Panel    Component Value Date/Time   CHOL 148 12/23/2011 0922   TRIG 54.0 12/23/2011 0922   HDL 54.70 12/23/2011 0922   CHOLHDL 3 12/23/2011 0922   VLDL 10.8 12/23/2011 0922   LDLCALC 83 12/23/2011 0922    Physical Exam:    Physical Exam: Blood pressure 110/62, pulse 100, height 5\' 2"  (1.575 m), weight 149 lb 9.6 oz (67.9 kg), SpO2 96 %.  GEN:  Well nourished, well developed  in no acute distress HEENT: Normal NECK: No JVD; No carotid bruits LYMPHATICS: No lymphadenopathy CARDIAC: irreg. Irreg.  RESPIRATORY:  Clear to auscultation without rales, wheezing or rhonchi  ABDOMEN: Soft, non-tender, non-distended MUSCULOSKELETAL:  No edema; No deformity  SKIN: Warm and dry NEUROLOGIC:  Alert and oriented x 3    ASSESSMENT:    1. Persistent atrial fibrillation ([place])  PLAN:      1.  Atrial fib: she has persistent atrial fib.   Rate is very controlled on the higher dose of metoprolol XL    2.  Obesity:   3.  Mild systolic congestive heart failure: Her last echocardiogram revealed an EF of 40 to 45%.  I would like her to see Richardson Dopp in approximately 6 months.  We will get an echocardiogram approximately 1 week prior to the office visit.  Continue current medications.   Medication Adjustments/Labs and Tests Ordered: Current medicines are reviewed at length with the patient today.  Concerns regarding medicines are outlined above.  Orders Placed This Encounter  Procedures  . ECHOCARDIOGRAM COMPLETE   No orders of the defined types were placed in this encounter.    Patient Instructions  Medication Instructions:  Your physician recommends that you continue on your current medications as directed. Please refer to the Current Medication list given to you today.  *If you need a refill on your cardiac medications before your next appointment, please call your pharmacy*   Lab Work: none If you have labs (blood work) drawn today and your tests are completely normal, you will receive your results only by: Marland Kitchen MyChart Message (if you have MyChart) OR . A paper copy in the mail If you have any lab test that is abnormal or we need to change your treatment, we will call you to review the results.   Testing/Procedures: DUE 1 WEEK PRIOR TO 6 MONTH APPOINTMENT  Your physician has requested that you have an echocardiogram. Echocardiography is a painless test  that uses sound waves to create images of your heart. It provides your doctor with information about the size and shape of your heart and how well your heart's chambers and valves are working. This procedure takes approximately one hour. There are no restrictions for this procedure.     Follow-Up: At [facility], you and your health needs are our priority.  As part of our continuing mission to provide you with exceptional heart care, we have created designated Provider Care Teams.  These Care Teams include your primary Cardiologist (physician) and Advanced Practice Providers (APPs -  Physician Assistants and Nurse Practitioners) who all work together to provide you with the care you need, when you need it.  Your next appointment:   6 month(s)  The format for your next appointment:   In Person  Provider:   Richardson Dopp, PA-C       Signed, Mertie Moores, MD  05/25/2020 10:53 AM    [place]

## 2020-05-25 ENCOUNTER — Encounter: Payer: Self-pay | Admitting: Cardiovascular Disease

## 2020-05-25 ENCOUNTER — Ambulatory Visit: Payer: Medicare PPO | Admitting: Cardiovascular Disease

## 2020-05-25 ENCOUNTER — Other Ambulatory Visit: Payer: Self-pay

## 2020-05-25 VITALS — BP 110/62 | HR 100 | Ht 62.0 in | Wt 149.6 lb

## 2020-05-25 DIAGNOSIS — I4819 Other persistent atrial fibrillation: Secondary | ICD-10-CM | POA: Diagnosis not present

## 2020-05-25 NOTE — Patient Instructions (Signed)
Medication Instructions:  Your physician recommends that you continue on your current medications as directed. Please refer to the Current Medication list given to you today.  *If you need a refill on your cardiac medications before your next appointment, please call your pharmacy*   Lab Work: none If you have labs (blood work) drawn today and your tests are completely normal, you will receive your results only by: Marland Kitchen MyChart Message (if you have MyChart) OR . A paper copy in the mail If you have any lab test that is abnormal or we need to change your treatment, we will call you to review the results.   Testing/Procedures: DUE 1 WEEK PRIOR TO 6 MONTH APPOINTMENT  Your physician has requested that you have an echocardiogram. Echocardiography is a painless test that uses sound waves to create images of your heart. It provides your doctor with information about the size and shape of your heart and how well your heart's chambers and valves are working. This procedure takes approximately one hour. There are no restrictions for this procedure.     Follow-Up: At [facility], you and your health needs are our priority.  As part of our continuing mission to provide you with exceptional heart care, we have created designated Provider Care Teams.  These Care Teams include your primary Cardiologist (physician) and Advanced Practice Providers (APPs -  Physician Assistants and Nurse Practitioners) who all work together to provide you with the care you need, when you need it.  Your next appointment:   6 month(s)  The format for your next appointment:   In Person  Provider:   Richardson Dopp, PA-C

## 2020-06-09 DIAGNOSIS — K7689 Other specified diseases of liver: Secondary | ICD-10-CM | POA: Diagnosis not present

## 2020-06-09 DIAGNOSIS — R310 Gross hematuria: Secondary | ICD-10-CM | POA: Diagnosis not present

## 2020-06-09 DIAGNOSIS — N281 Cyst of kidney, acquired: Secondary | ICD-10-CM | POA: Diagnosis not present

## 2020-06-09 DIAGNOSIS — K862 Cyst of pancreas: Secondary | ICD-10-CM | POA: Diagnosis not present

## 2020-06-15 ENCOUNTER — Other Ambulatory Visit: Payer: Self-pay | Admitting: Family Medicine

## 2020-07-06 DIAGNOSIS — R310 Gross hematuria: Secondary | ICD-10-CM | POA: Diagnosis not present

## 2020-07-06 DIAGNOSIS — D414 Neoplasm of uncertain behavior of bladder: Secondary | ICD-10-CM | POA: Diagnosis not present

## 2020-07-09 ENCOUNTER — Other Ambulatory Visit: Payer: Self-pay | Admitting: Urology

## 2020-07-09 ENCOUNTER — Telehealth: Payer: Self-pay | Admitting: Cardiovascular Disease

## 2020-07-09 NOTE — Telephone Encounter (Signed)
Patient with diagnosis of afib on Eliquis for anticoagulation.    Procedure: Cysto with Biopsy Date of procedure: 07/31/20  CHA2DS2-VASc Score = 3  This indicates a 3.2% annual risk of stroke. The patient's score is based upon: CHF History: Yes HTN History: No Diabetes History: No Stroke History: No Vascular Disease History: No Age Score: 1 Gender Score: 1     CrCl 45 ml/min Platelet count 215  Per office protocol, patient can hold Eliquis for 2 days prior to procedure.

## 2020-07-09 NOTE — Telephone Encounter (Signed)
   [facility] Pre-operative Risk Assessment    Request for surgical clearance:  1. What type of surgery is being performed? Cysto with Biopsy  2. When is this surgery scheduled? 07/31/2020  3. What type of clearance is required (medical clearance vs. Pharmacy clearance to hold med vs. Both)? Meds  4. Are there any medications that need to be held prior to surgery and how long? Eliquis  5. Practice name and name of physician performing surgery? Dr. Jeffie Pollock [facility]  6. What is your office phone number [phone]   7.   What is your office fax number [phone].   Anesthesia type (None, local, MAC, general) ? General   Larina Bras 07/09/2020, 3:46 PM  _________________________________________________________________   (provider comments below)

## 2020-07-09 NOTE — Telephone Encounter (Signed)
Please comment on Eliquis. Thanks! 

## 2020-07-10 NOTE — Telephone Encounter (Signed)
    [name] DOB:  [birth year]  MRN:  [id number]   Primary Cardiologist: Mertie Moores, MD  Chart reviewed as part of pre-operative protocol coverage. Given past medical history and time since last visit, based on ACC/AHA guidelines, [name] would be at acceptable risk for the planned procedure without further cardiovascular testing. Per pharmacy recommendations OK to hold Eliquis 2 days prior to procedure.  Last seen /28/22 and stable from a cardiac perspective.   I will route this recommendation to the requesting party via Epic fax function and remove from pre-op pool.  Please call with questions.  Kyandre Okray Ninfa Meeker, PA-C 07/10/2020, 9:04 AM

## 2020-08-05 ENCOUNTER — Telehealth: Payer: Self-pay | Admitting: *Deleted

## 2020-08-05 DIAGNOSIS — M810 Age-related osteoporosis without current pathological fracture: Secondary | ICD-10-CM

## 2020-08-05 NOTE — Telephone Encounter (Signed)
-----   Message from Kathaleen Grinder sent at 08/05/2020  3:41 PM EDT ----- Regarding: Bone Density Order Rescheduling past bone density and I need a new order for this patient placed she is scheduled in 08/26

## 2020-08-05 NOTE — Telephone Encounter (Signed)
New order placed

## 2020-08-12 DIAGNOSIS — L570 Actinic keratosis: Secondary | ICD-10-CM | POA: Diagnosis not present

## 2020-08-12 DIAGNOSIS — L819 Disorder of pigmentation, unspecified: Secondary | ICD-10-CM | POA: Diagnosis not present

## 2020-08-13 ENCOUNTER — Other Ambulatory Visit: Payer: Self-pay | Admitting: Family Medicine

## 2020-08-14 NOTE — Telephone Encounter (Signed)
Last refill- 01/27/20-30 tabs, 5 refills Last office visit- 05/13/20  No future office visit scheduled

## 2020-08-27 ENCOUNTER — Encounter (HOSPITAL_BASED_OUTPATIENT_CLINIC_OR_DEPARTMENT_OTHER): Payer: Self-pay | Admitting: Urology

## 2020-08-27 ENCOUNTER — Other Ambulatory Visit: Payer: Self-pay

## 2020-08-27 NOTE — Progress Notes (Signed)
Spoke w/ via phone for pre-op interview--- Pt Lab needs dos---- Istat              Lab results------ current ekg in epic/ chart COVID test -----patient states asymptomatic no test needed  Arrive at ------- 0900 on 09-01-2020 NPO after MN NO Solid Food.  Clear liquids from MN until--- 0800 Med rec completed Medications to take morning of surgery ----- Toprol  Diabetic medication ----- n/a  Patient instructed no nail polish to be worn day of surgery Patient instructed to bring photo id and insurance card day of surgery Patient aware to have Driver (ride ) / caregiver    for 24 hours after surgery --husband, [name] Patient Special Instructions ----- n/a Pre-Op special Istructions ----- pt has telephone cardiac clearance by Cadence Furth PA on 07-10-2020 in epic / chart  Patient verbalized understanding of instructions that were given at this phone interview. Patient denies shortness of breath, chest pain, fever, cough at this phone interview.  Anesthesia :  Persistant AFib dx 08/ 2020 s/p DCCV 09/ 2020 on Eliquis;  NICM per last echo ef 40-45%;  Hx ovarian cancer 2007 , no recurrence.  Pt denies any sob, no peripheral swelling, and no chest pain.  PCP:  [name] Cardiologist : [name] [name] 05-25-2020 epic) Chest x-ray : 04-08-2014 EKG : 11-20-2019 Echo : 10-24-2019 Stress test: nuclear 12-25-2019 Cardiac Cath : no Activity level:  Denies sob w/ any activity Sleep Study/ CPAP : no  Blood Thinner/ Instructions /Last Dose: Eliquis ASA / Instructions/ Last Dose : NO Pt stated will have last dose eliquis 08-29-2020 prior to surgery per cardiology clearance to stop 2 days prior

## 2020-08-31 NOTE — Progress Notes (Signed)
Pt had left message on Sunday 08-30-2020 that she started symptoms on 08-28-2020, cough congestion, runny nose, "feeling real bad".  Called pt back on Monday 08-31-2020 spoke w/ her on the phone.  I can rear over the phone is sick with cough congestion. Pt stated she did home test and it was negative.  Pt wanted to know if she should get covid pcr testing or just reschedule.  Told with the symptoms that is currently even if test was negative anesthesia would probably approve of her having surgery so would be best that she reschedule.  Pt stated she had already left message for Dr Jeffie Pollock OR scheduler, Jeannene Patella.  I also called left message for Pam that pt would be rescheduling.

## 2020-09-04 ENCOUNTER — Telehealth: Payer: Self-pay | Admitting: Family Medicine

## 2020-09-04 NOTE — Telephone Encounter (Signed)
Left message for patient to call back and schedule Medicare Annual Wellness Visit (AWV) either virtually or in office.   AWV-S per [place] 03/29/15 please schedule at anytime with [facility] Nurse Health Advisor 1 or 2   This should be a 45 minute visit.

## 2020-09-04 NOTE — Telephone Encounter (Signed)
Pt called in stating that at this present time she is not interested in having this type of visit she would just like to do the regular CPE with her provider.

## 2020-09-15 ENCOUNTER — Encounter (HOSPITAL_BASED_OUTPATIENT_CLINIC_OR_DEPARTMENT_OTHER): Payer: Self-pay | Admitting: Urology

## 2020-09-15 ENCOUNTER — Other Ambulatory Visit: Payer: Self-pay

## 2020-09-15 NOTE — Progress Notes (Signed)
Spoke w/ via phone for pre-op interview--- Pt Lab needs dos---- Istat              Lab results------ current ekg in epic/ chart COVID test -----patient states asymptomatic no test needed  Arrive at ------- 1030 on 09-18-2020 NPO after MN NO Solid Food.  Clear liquids from MN until--- 0930 Med rec completed Medications to take morning of surgery ----- Toprol  Diabetic medication ----- n/a  Patient instructed no nail polish to be worn day of surgery Patient instructed to bring photo id and insurance card day of surgery Patient aware to have Driver (ride ) / caregiver    for 24 hours after surgery --husband, Dominica Severin Patient Special Instructions ----- n/a Pre-Op special Istructions ----- pt has telephone cardiac clearance by Cadence Furth PA on 07-10-2020 in epic / chart  Patient verbalized understanding of instructions that were given at this phone interview. Patient denies shortness of breath, chest pain, fever, cough at this phone interview.  Anesthesia :  Persistant AFib dx 08/ 2020 s/p DCCV 09/ 2020 on Eliquis;  NICM per last echo ef 40-45%;  Hx ovarian cancer 2007 , no recurrence.  Pt denies any sob, no peripheral swelling, and no chest pain.  PCP:  Dr B. Bruchette Cardiologist : Dr Cathie Olden Cassell Clement 05-25-2020 epic) Chest x-ray : 04-08-2014 EKG : 11-20-2019 Echo : 10-24-2019 Stress test: nuclear 12-25-2019 Cardiac Cath : no Activity level:  Denies sob w/ any activity Sleep Study/ CPAP : no  Blood Thinner/ Instructions /Last Dose: Eliquis ASA / Instructions/ Last Dose : NO Pt stated will have last dose eliquis am 09-16-2020 prior to surgery per cardiology clearance to stop 2 days prior

## 2020-09-16 NOTE — H&P (Signed)
cc: Gross hematuria   HPI: 75 year old woman with a past medical history of atrial fibrillation, (on Eliquis), nephrolithiasis, who was last seen in our office 2 years ago. She presents today with concerns of gross hematuria. This began back in January and has been intermittent. She reports that she has been treated for 3 urinary tract infections and each time the hematuria returns. She does not believe they have ever gotten a culture. She recently began taking Eliquis for atrial fibrillation and reports that her heart rate is not always controlled and does very from 105-150. She is currently taking 150 mg of metoprolol for this she denies large clots, unilateral or bilateral flank pain, fevers, chills, changes to her voiding habits. She is not in any discomfort. She denies shortness of breath. She has a history of uterine cancer and received chemotherapy treatment but no radiation. She is a nonsmoker. She has not seen any stones pass in her urine.     ALLERGIES: morphine - Nausea Penicillins - Hives    MEDICATIONS: Metoprolol Succinate 50 mg tablet, extended release 24 hr  Metoprolol Succinate 100 mg tablet, extended release 24 hr  Ambien TABS Oral  Eliquis 5 mg tablet  Multi-Day Vitamins TABS Oral     GU PSH: Cysto Bladder Ureth Biopsy - 2014 Hysterectomy Unilat SO - 2014 Locm 300-399Mg /Ml Iodine,1Ml - 06/09/2020 Ovary Removal Partial or Total - 2014       PSH Notes: Gastric Surgery For Morbid Obesity Gastroplasty, Resection Of Omentum, Hysterectomy, Cystoscopy With Biopsy, Oophorectomy, Splenectomy, Cholecystectomy, appendectomy   NON-GU PSH: Cholecystectomy (open) - 2014 Gastroplasty For Obesity - 2014 Remove Omentum - 2014 Splenectomy - 2014     GU PMH: Gross hematuria - 06/09/2020, - 05/18/2020 Renal calculus, She has had no symptoms of recurrent stones. I will have her return in a year with a KUB. - 2018 Urinary Tract Inf, Unspec site, She has pyuria without symptoms today. I  will check a culture and call with results. - 2018 Ureteral calculus - 2018, Calculus of ureter, - 2014 Personal Hx Oth Urinary System diseases, History of chronic cystitis - 2014 Ovarian Cancer, Left Ovarian Cancer, Right      PMH Notes:  1898-03-28 00:00:00 - Note: Normal Routine History And Physical Adult  2012-06-18 12:39:52 - Note: Arthritis   NON-GU PMH: Pyuria/other UA findings (Stable), Culture was Mx species last year and she has no irritative symptoms. - 03/26/2018 Arthritis Atrial Fibrillation    FAMILY HISTORY: Cancer - Sister, Father Heart Disease - Runs in Family nephrolithiasis - Runs In Family   SOCIAL HISTORY: Marital Status: Married Preferred Language: English; Race: White Current Smoking Status: Patient has never smoked.   Tobacco Use Assessment Completed: Used Tobacco in last 30 days? Has never drank.  Drinks 2 caffeinated drinks per day. Patient's occupation is/was retired.     Notes: Death In The Family Mother, Death In The Family Father, Alcohol Use, Tobacco Use, Caffeine Use, Marital History - Currently Married, Occupation: 1 son 1 daughter   REVIEW OF SYSTEMS:    GU Review Female:   Patient denies frequent urination, hard to postpone urination, burning /pain with urination, get up at night to urinate, leakage of urine, stream starts and stops, trouble starting your stream, have to strain to urinate, and being pregnant.  Gastrointestinal (Upper):   Patient denies nausea, vomiting, and indigestion/ heartburn.  Gastrointestinal (Lower):   Patient denies diarrhea and constipation.  Constitutional:   Patient denies fever, weight loss, fatigue, and night sweats.  Skin:   Patient denies skin rash/ lesion and itching.  Eyes:   Patient denies blurred vision and double vision.  Ears/ Nose/ Throat:   Patient denies sore throat and sinus problems.  Hematologic/Lymphatic:   Patient denies swollen glands and easy bruising.  Cardiovascular:   Patient denies leg  swelling and chest pains.  Respiratory:   Patient denies cough and shortness of breath.  Endocrine:   Patient denies excessive thirst.  Musculoskeletal:   Patient denies back pain and joint pain.  Neurological:   Patient denies headaches and dizziness.  Psychologic:   Patient denies depression and anxiety.   VITAL SIGNS:      07/06/2020 10:17 AM  Weight 147 lb / 66.68 kg  Height 62 in / 157.48 cm  BP 119/85 mmHg  Pulse 123 /min  Temperature 96.9 F / 36.0 C  BMI 26.9 kg/m   Complexity of Data:  Records Review:   Previous Patient Records  X-Ray Review: C.T. Hematuria: Reviewed Films. Reviewed Report. Discussed With Patient.     PROCEDURES:         Flexible Cystoscopy - 52000  Risks, benefits, and some of the potential complications of the procedure were discussed. She was prepped with betadine. Cipro 500mg  given for antibiotic prophylaxis.    Meatus:  Normal size. Normal location. Normal condition.  Urethra:  No hypermobility. No leakage.  Ureteral Orifices:  Normal location. Normal size. Normal shape. Effluxed clear urine.  Bladder:  Mild trabeculation. Erythematous mucosa there are 2 small areas on the posterior bladder wall that have increased vascular density and some concern for possible CIS. Marland Kitchen No tumors. No stones.      The procedure was well tolerated and there were no complications.         Urinalysis Dipstick Dipstick Cont'd  Color: Yellow Bilirubin: Neg mg/dL  Appearance: Clear Ketones: Neg mg/dL  Specific Gravity: 1.015 Blood: Neg ery/uL  pH: 6.0 Protein: Neg mg/dL  Glucose: Neg mg/dL Urobilinogen: 0.2 mg/dL    Nitrites: Neg    Leukocyte Esterase: Neg leu/uL    ASSESSMENT:      ICD-10 Details  1 GU:   Gross hematuria - R31.0 Acute, Resolved - the hematuria has cleared but there is a subtle bladder wall lesion that needs a biopsy to r/o CIS. I will get a cytology today. I reviewed the risks of bleeding, infection, bladder wall injury, need for catheter or  secondary procedures, thrombotic events and anesthetic complications.   2   Bladder tumor/neoplasm - D41.4 Undiagnosed New Problem     PLAN:           Orders Labs Cytology w/reflex FISH          Schedule Return Visit/Planned Activity: Next Available Appointment - Schedule Surgery          Document Letter(s):  Created for Patient: Clinical Summary

## 2020-09-18 ENCOUNTER — Ambulatory Visit (HOSPITAL_BASED_OUTPATIENT_CLINIC_OR_DEPARTMENT_OTHER): Payer: Medicare PPO | Admitting: Anesthesiology

## 2020-09-18 ENCOUNTER — Encounter (HOSPITAL_BASED_OUTPATIENT_CLINIC_OR_DEPARTMENT_OTHER)
Admission: RE | Disposition: A | Discharge status: Home / Self Care | Payer: Self-pay | Source: Home / Self Care | Attending: Urology

## 2020-09-18 ENCOUNTER — Encounter (HOSPITAL_BASED_OUTPATIENT_CLINIC_OR_DEPARTMENT_OTHER): Payer: Self-pay | Admitting: Anesthesiology

## 2020-09-18 ENCOUNTER — Ambulatory Visit (HOSPITAL_BASED_OUTPATIENT_CLINIC_OR_DEPARTMENT_OTHER)
Admission: RE | Admit: 2020-09-18 | Discharge: 2020-09-18 | Disposition: A | Discharge status: Home / Self Care | Payer: Medicare PPO | Attending: Urology | Admitting: Urology

## 2020-09-18 ENCOUNTER — Encounter (HOSPITAL_BASED_OUTPATIENT_CLINIC_OR_DEPARTMENT_OTHER): Payer: Self-pay | Admitting: Urology

## 2020-09-18 DIAGNOSIS — Z9221 Personal history of antineoplastic chemotherapy: Secondary | ICD-10-CM | POA: Diagnosis not present

## 2020-09-18 DIAGNOSIS — N3030 Trigonitis without hematuria: Secondary | ICD-10-CM | POA: Insufficient documentation

## 2020-09-18 DIAGNOSIS — I429 Cardiomyopathy, unspecified: Secondary | ICD-10-CM | POA: Diagnosis not present

## 2020-09-18 DIAGNOSIS — D303 Benign neoplasm of bladder: Secondary | ICD-10-CM | POA: Diagnosis not present

## 2020-09-18 DIAGNOSIS — Z79899 Other long term (current) drug therapy: Secondary | ICD-10-CM | POA: Insufficient documentation

## 2020-09-18 DIAGNOSIS — Z885 Allergy status to narcotic agent status: Secondary | ICD-10-CM | POA: Insufficient documentation

## 2020-09-18 DIAGNOSIS — M810 Age-related osteoporosis without current pathological fracture: Secondary | ICD-10-CM | POA: Diagnosis not present

## 2020-09-18 DIAGNOSIS — Z8542 Personal history of malignant neoplasm of other parts of uterus: Secondary | ICD-10-CM | POA: Insufficient documentation

## 2020-09-18 DIAGNOSIS — N3289 Other specified disorders of bladder: Secondary | ICD-10-CM | POA: Insufficient documentation

## 2020-09-18 DIAGNOSIS — I4891 Unspecified atrial fibrillation: Secondary | ICD-10-CM | POA: Diagnosis not present

## 2020-09-18 DIAGNOSIS — Z87442 Personal history of urinary calculi: Secondary | ICD-10-CM | POA: Diagnosis not present

## 2020-09-18 DIAGNOSIS — N3080 Other cystitis without hematuria: Secondary | ICD-10-CM | POA: Diagnosis not present

## 2020-09-18 DIAGNOSIS — I480 Paroxysmal atrial fibrillation: Secondary | ICD-10-CM | POA: Diagnosis not present

## 2020-09-18 DIAGNOSIS — Z880 Allergy status to penicillin: Secondary | ICD-10-CM | POA: Diagnosis not present

## 2020-09-18 DIAGNOSIS — Z7901 Long term (current) use of anticoagulants: Secondary | ICD-10-CM | POA: Diagnosis not present

## 2020-09-18 HISTORY — DX: Nausea with vomiting, unspecified: R11.2

## 2020-09-18 HISTORY — DX: Long term (current) use of anticoagulants: Z79.01

## 2020-09-18 HISTORY — DX: Personal history of urinary calculi: Z87.442

## 2020-09-18 HISTORY — PX: CYSTOSCOPY WITH BIOPSY: SHX5122

## 2020-09-18 HISTORY — DX: Personal history of other venous thrombosis and embolism: Z86.718

## 2020-09-18 HISTORY — DX: Other specified postprocedural states: Z98.890

## 2020-09-18 HISTORY — DX: Unspecified osteoarthritis, unspecified site: M19.90

## 2020-09-18 HISTORY — DX: Bladder disorder, unspecified: N32.9

## 2020-09-18 HISTORY — DX: Personal history of malignant neoplasm of ovary: Z85.43

## 2020-09-18 HISTORY — DX: Hematuria, unspecified: R31.9

## 2020-09-18 LAB — POCT I-STAT, CHEM 8
BUN: 14 mg/dL (ref 8–23)
Calcium, Ion: 1.28 mmol/L (ref 1.15–1.40)
Chloride: 108 mmol/L (ref 98–111)
Creatinine, Ser: 0.8 mg/dL (ref 0.44–1.00)
Glucose, Bld: 89 mg/dL (ref 70–99)
HCT: 42 % (ref 36.0–46.0)
Hemoglobin: 14.3 g/dL (ref 12.0–15.0)
Potassium: 3.7 mmol/L (ref 3.5–5.1)
Sodium: 142 mmol/L (ref 135–145)
TCO2: 20 mmol/L — ABNORMAL LOW (ref 22–32)

## 2020-09-18 SURGERY — CYSTOSCOPY, WITH BIOPSY
Anesthesia: General

## 2020-09-18 MED ORDER — PROPOFOL 10 MG/ML IV BOLUS
INTRAVENOUS | Status: DC | PRN
  Administered 2020-09-18: 200 mg via INTRAVENOUS

## 2020-09-18 MED ORDER — FENTANYL CITRATE (PF) 100 MCG/2ML IJ SOLN: 25.0000 ug | INTRAMUSCULAR | Status: DC | PRN

## 2020-09-18 MED ORDER — FENTANYL CITRATE (PF) 100 MCG/2ML IJ SOLN
INTRAMUSCULAR | Status: DC | PRN
  Administered 2020-09-18: 100 ug via INTRAVENOUS

## 2020-09-18 MED ORDER — ONDANSETRON HCL 4 MG/2ML IJ SOLN: 4.0000 mg | Freq: Once | INTRAMUSCULAR | Status: DC | PRN

## 2020-09-18 MED ORDER — LIDOCAINE HCL (PF) 2 % IJ SOLN
INTRAMUSCULAR | Status: AC
  Filled 2020-09-18: qty 5

## 2020-09-18 MED ORDER — STERILE WATER FOR IRRIGATION IR SOLN
Status: DC | PRN
  Administered 2020-09-18: 3000 mL

## 2020-09-18 MED ORDER — PROPOFOL 10 MG/ML IV BOLUS
INTRAVENOUS | Status: AC
  Filled 2020-09-18: qty 20

## 2020-09-18 MED ORDER — OXYCODONE HCL 5 MG PO TABS: 5.0000 mg | ORAL_TABLET | Freq: Once | ORAL | Status: DC | PRN

## 2020-09-18 MED ORDER — CIPROFLOXACIN IN D5W 400 MG/200ML IV SOLN
400.0000 mg | INTRAVENOUS | Status: AC
  Administered 2020-09-18: 400 mg via INTRAVENOUS

## 2020-09-18 MED ORDER — ACETAMINOPHEN 325 MG PO TABS: 325.0000 mg | ORAL_TABLET | ORAL | Status: DC | PRN

## 2020-09-18 MED ORDER — DEXAMETHASONE SODIUM PHOSPHATE 10 MG/ML IJ SOLN
INTRAMUSCULAR | Status: AC
  Filled 2020-09-18: qty 1

## 2020-09-18 MED ORDER — MEPERIDINE HCL 25 MG/ML IJ SOLN: 6.2500 mg | INTRAMUSCULAR | Status: DC | PRN

## 2020-09-18 MED ORDER — SODIUM CHLORIDE 0.9 % IV SOLN: 250.0000 mL | INTRAVENOUS | Status: DC | PRN

## 2020-09-18 MED ORDER — ONDANSETRON HCL 4 MG/2ML IJ SOLN
INTRAMUSCULAR | Status: AC
  Filled 2020-09-18: qty 2

## 2020-09-18 MED ORDER — OXYCODONE HCL 5 MG/5ML PO SOLN: 5.0000 mg | Freq: Once | ORAL | Status: DC | PRN

## 2020-09-18 MED ORDER — MIDAZOLAM HCL 5 MG/5ML IJ SOLN
INTRAMUSCULAR | Status: DC | PRN
  Administered 2020-09-18: 2 mg via INTRAVENOUS

## 2020-09-18 MED ORDER — MIDAZOLAM HCL 2 MG/2ML IJ SOLN
INTRAMUSCULAR | Status: AC
  Filled 2020-09-18: qty 2

## 2020-09-18 MED ORDER — TRAMADOL HCL 50 MG PO TABS: 50.0000 mg | ORAL_TABLET | Freq: Four times a day (QID) | ORAL | Status: DC | PRN

## 2020-09-18 MED ORDER — EPHEDRINE 5 MG/ML INJ
INTRAVENOUS | Status: AC
  Filled 2020-09-18: qty 10

## 2020-09-18 MED ORDER — SODIUM CHLORIDE 0.9% FLUSH: 3.0000 mL | INTRAVENOUS | Status: DC | PRN

## 2020-09-18 MED ORDER — ACETAMINOPHEN 160 MG/5ML PO SOLN: 325.0000 mg | ORAL | Status: DC | PRN

## 2020-09-18 MED ORDER — ACETAMINOPHEN 325 MG PO TABS: 650.0000 mg | ORAL_TABLET | ORAL | Status: DC | PRN

## 2020-09-18 MED ORDER — CIPROFLOXACIN IN D5W 400 MG/200ML IV SOLN
INTRAVENOUS | Status: AC
  Filled 2020-09-18: qty 200

## 2020-09-18 MED ORDER — FENTANYL CITRATE (PF) 100 MCG/2ML IJ SOLN
INTRAMUSCULAR | Status: AC
  Filled 2020-09-18: qty 2

## 2020-09-18 MED ORDER — SODIUM CHLORIDE 0.9% FLUSH: 3.0000 mL | Freq: Two times a day (BID) | INTRAVENOUS | Status: DC

## 2020-09-18 MED ORDER — LIDOCAINE HCL (CARDIAC) PF 100 MG/5ML IV SOSY
PREFILLED_SYRINGE | INTRAVENOUS | Status: DC | PRN
  Administered 2020-09-18: 60 mg via INTRAVENOUS

## 2020-09-18 MED ORDER — ONDANSETRON HCL 4 MG/2ML IJ SOLN
INTRAMUSCULAR | Status: DC | PRN
  Administered 2020-09-18: 4 mg via INTRAVENOUS

## 2020-09-18 MED ORDER — DEXAMETHASONE SODIUM PHOSPHATE 4 MG/ML IJ SOLN
INTRAMUSCULAR | Status: DC | PRN
  Administered 2020-09-18: 10 mg via INTRAVENOUS

## 2020-09-18 MED ORDER — LACTATED RINGERS IV SOLN: INTRAVENOUS | Status: DC

## 2020-09-18 MED ORDER — ACETAMINOPHEN 325 MG RE SUPP: 650.0000 mg | RECTAL | Status: DC | PRN

## 2020-09-18 MED ORDER — SUCCINYLCHOLINE CHLORIDE 200 MG/10ML IV SOSY
PREFILLED_SYRINGE | INTRAVENOUS | Status: AC
  Filled 2020-09-18: qty 10

## 2020-09-18 SURGICAL SUPPLY — 22 items
BAG DRAIN URO-CYSTO SKYTR STRL (DRAIN) ×2 IMPLANT
BAG DRN UROCATH (DRAIN) ×1
CATH FOLEY 2WAY SLVR  5CC 16FR (CATHETERS)
CATH FOLEY 2WAY SLVR 5CC 16FR (CATHETERS) IMPLANT
CLOTH BEACON ORANGE TIMEOUT ST (SAFETY) ×2 IMPLANT
ELECT REM PT RETURN 9FT ADLT (ELECTROSURGICAL) ×2
ELECTRODE REM PT RTRN 9FT ADLT (ELECTROSURGICAL) ×1 IMPLANT
GLOVE SURG ENC MOIS LTX SZ6.5 (GLOVE) ×1 IMPLANT
GLOVE SURG POLYISO LF SZ8 (GLOVE) ×2 IMPLANT
GLOVE SURG UNDER LTX SZ6.5 (GLOVE) ×1 IMPLANT
GOWN STRL REUS W/ TWL LRG LVL3 (GOWN DISPOSABLE) IMPLANT
GOWN STRL REUS W/TWL LRG LVL3 (GOWN DISPOSABLE) ×6 IMPLANT
KIT TURNOVER CYSTO (KITS) ×2 IMPLANT
MANIFOLD NEPTUNE II (INSTRUMENTS) ×2 IMPLANT
NDL SAFETY ECLIPSE 18X1.5 (NEEDLE) IMPLANT
NEEDLE HYPO 18GX1.5 SHARP (NEEDLE)
NEEDLE HYPO 22GX1.5 SAFETY (NEEDLE) IMPLANT
NS IRRIG 500ML POUR BTL (IV SOLUTION) IMPLANT
PACK CYSTO (CUSTOM PROCEDURE TRAY) ×2 IMPLANT
SYR 20ML LL LF (SYRINGE) IMPLANT
TUBE CONNECTING 12X1/4 (SUCTIONS) ×2 IMPLANT
WATER STERILE IRR 3000ML UROMA (IV SOLUTION) ×2 IMPLANT

## 2020-09-18 NOTE — Interval H&P Note (Signed)
History and Physical Interval Note:  09/18/2020 12:39 PM  [name]  has presented today for surgery, with the diagnosis of BLADDER LESION.  The various methods of treatment have been discussed with the patient and family. After consideration of risks, benefits and other options for treatment, the patient has consented to  Procedure(s): CYSTOSCOPY WITH BIOPSY FULGURATION (N/A) as a surgical intervention.  The patient's history has been reviewed, patient examined, no change in status, stable for surgery.  I have reviewed the patient's chart and labs.  Questions were answered to the patient's satisfaction.     Irine Seal

## 2020-09-18 NOTE — Transfer of Care (Signed)
Immediate Anesthesia Transfer of Care Note  Patient: [name]  Procedure(s) Performed: CYSTOSCOPY WITH BIOPSY FULGURATION  Patient Location: PACU  Anesthesia Type:General  Level of Consciousness: awake, alert , oriented, drowsy and patient cooperative  Airway & Oxygen Therapy: Patient Spontanous Breathing and Patient connected to face mask oxygen  Post-op Assessment: Report given to RN and Post -op Vital signs reviewed and stable  Post vital signs: Reviewed and stable  Last Vitals:  Vitals Value Taken Time  BP    Temp    Pulse    Resp    SpO2      Last Pain:  Vitals:   09/18/20 1038  TempSrc: Oral  PainSc: 0-No pain      Patients Stated Pain Goal: 5 (09/32/35 5732)  Complications: No notable events documented.

## 2020-09-18 NOTE — Discharge Instructions (Addendum)
CYSTOSCOPY HOME CARE INSTRUCTIONS  Activity: Rest for the remainder of the day.  Do not drive or operate equipment today.  You may resume normal activities in one to two days as instructed by your physician.   Meals: Drink plenty of liquids and eat light foods such as gelatin or soup this evening.  You may return to a normal meal plan tomorrow.  Return to Work: You may return to work in one to two days or as instructed by your physician.  Special Instructions / Symptoms: Call your physician if any of these symptoms occur:   -persistent or heavy bleeding  -bleeding which continues after first few urination  -large blood clots that are difficult to pass  -urine stream diminishes or stops completely  -fever equal to or higher than 101 degrees Farenheit.  -cloudy urine with a strong, foul odor  -severe pain  Females should always wipe from front to back after elimination.  You may feel some burning pain when you urinate.  This should disappear with time.  Applying moist heat to the lower abdomen or a hot tub bath may help relieve the pain. \    Post Anesthesia Home Care Instructions  Activity: Get plenty of rest for the remainder of the day. A responsible individual must stay with you for 24 hours following the procedure.  For the next 24 hours, DO NOT: -Drive a car -Paediatric nurse -Drink alcoholic beverages -Take any medication unless instructed by your physician -Make any legal decisions or sign important papers.  Meals: Start with liquid foods such as gelatin or soup. Progress to regular foods as tolerated. Avoid greasy, spicy, heavy foods. If nausea and/or vomiting occur, drink only clear liquids until the nausea and/or vomiting subsides. Call your physician if vomiting continues.  Special Instructions/Symptoms: Your throat may feel dry or sore from the anesthesia or the breathing tube placed in your throat during surgery. If this causes discomfort, gargle with warm salt  water. The discomfort should disappear within 24 hours.  If you had a scopolamine patch placed behind your ear for the management of post- operative nausea and/or vomiting:  1. The medication in the patch is effective for 72 hours, after which it should be removed.  Wrap patch in a tissue and discard in the trash. Wash hands thoroughly with soap and water. 2. You may remove the patch earlier than 72 hours if you experience unpleasant side effects which may include dry mouth, dizziness or visual disturbances. 3. Avoid touching the patch. Wash your hands with soap and water after contact with the patch.

## 2020-09-18 NOTE — Op Note (Signed)
Procedure: Cystoscopy with bladder biopsy and fulguration.  Preop diagnosis: Bladder wall lesions on the posterior wall and dome.  Postop diagnosis: Same.  Surgeon: Dr. Irine Seal.  Anesthesia: General.  Specimens: Bladder biopsies from the posterior wall and dome.  EBL: None.  Drains: None.  Complications: None.  Indications: The patient is a 75 year old female who recently underwent office cystoscopy and was found to have erythematous lesions on the posterior wall and dome of the bladder which while subtle could represent carcinoma in situ so it was felt that biopsies were indicated.  She had previous biopsies in 2014.  Procedure: She was given 2 g of Ancef.  A general anesthetic was induced.  She was placed in lithotomy position and fitted with PAS hose.  Her perineum and genitalia were prepped with Betadine solution she was draped in usual sterile fashion.  Cystoscopy was performed using a 21 [place] scope and 30 degree lens.  Examination revealed a normal urethra.  The bladder wall had mild trabeculation.  There were 2 scars on the posterior wall and dome of the bladder with some surrounding erythema as noted on office cystoscopy.  No papillary tumors were identified.  The ureteral orifices were in the normal anatomic position.  A cup biopsy forceps was used to biopsy the dome and posterior wall lesions and then the biopsy sites were fulgurated.  Each lesion was approximately 1 to 1.5 cm in greatest diameter.  Once fulguration was complete and the biopsy site and surrounding abnormal mucosa were ablated, final inspection revealed no evidence of bladder wall perforation and no hematuria.  The bladder was drained and the cystoscope was removed.  Is not felt that a Foley was indicated.  She was taken down from lithotomy position, her anesthetic was reversed and she was moved recovery in stable condition.  There were no complications.

## 2020-09-18 NOTE — Anesthesia Procedure Notes (Signed)
Procedure Name: LMA Insertion Date/Time: 09/18/2020 12:51 PM Performed by: Willa Frater, CRNA Pre-anesthesia Checklist: Patient identified, Emergency Drugs available, Suction available and Patient being monitored Patient Re-evaluated:Patient Re-evaluated prior to induction Oxygen Delivery Method: Circle system utilized Preoxygenation: Pre-oxygenation with 100% oxygen Induction Type: IV induction Ventilation: Mask ventilation without difficulty LMA: LMA inserted LMA Size: 4.0 Number of attempts: 1 Airway Equipment and Method: Bite block Placement Confirmation: positive ETCO2 Tube secured with: Tape Dental Injury: Teeth and Oropharynx as per pre-operative assessment

## 2020-09-18 NOTE — Anesthesia Postprocedure Evaluation (Signed)
Anesthesia Post Note  Patient: DOROTEA HAND  Procedure(s) Performed: CYSTOSCOPY WITH BIOPSY FULGURATION     Patient location during evaluation: PACU Anesthesia Type: General Level of consciousness: awake and alert Pain management: pain level controlled Vital Signs Assessment: post-procedure vital signs reviewed and stable Respiratory status: spontaneous breathing, nonlabored ventilation, respiratory function stable and patient connected to nasal cannula oxygen Cardiovascular status: blood pressure returned to baseline and stable Postop Assessment: no apparent nausea or vomiting Anesthetic complications: no   No notable events documented.  Last Vitals:  Vitals:   09/18/20 1330 09/18/20 1340  BP: (!) 103/56 (!) 94/49  Pulse: 77 70  Resp: 19 16  Temp:  (!) 36.3 C  SpO2: 100% 97%    Last Pain:  Vitals:   09/18/20 1330  TempSrc:   PainSc: 0-No pain                 Ltanya Bayley

## 2020-09-18 NOTE — Anesthesia Preprocedure Evaluation (Addendum)
Anesthesia Evaluation  Patient identified by MRN, date of birth, ID band Patient awake    Reviewed: Allergy & Precautions, H&P , NPO status , Patient's Chart, lab work & pertinent test results  History of Anesthesia Complications (+) PONV and history of anesthetic complications  Airway Mallampati: II  TM Distance: >3 FB Neck ROM: Full    Dental no notable dental hx. (+) Teeth Intact, Caps, Missing, Dental Advisory Given   Pulmonary neg pulmonary ROS,    Pulmonary exam normal breath sounds clear to auscultation       Cardiovascular Exercise Tolerance: Good Pt. on home beta blockers Normal cardiovascular exam+ dysrhythmias Atrial Fibrillation  Rhythm:Regular Rate:Normal  Echo 7/21: EF 40-45, global HK, normal RVSF, mild BAE, mild MR // Myoview 9/21: EF 52, no ischemia, low risk    Neuro/Psych  Headaches, negative psych ROS   GI/Hepatic negative GI ROS, Neg liver ROS,   Endo/Other    Renal/GU Renal disease   Hx of ovarian CA 2007    Musculoskeletal   Abdominal   Peds  Hematology   Anesthesia Other Findings   Reproductive/Obstetrics                            Anesthesia Physical  Anesthesia Plan  ASA: 3  Anesthesia Plan: General   Post-op Pain Management:    Induction: Intravenous  PONV Risk Score and Plan: 3 and Treatment may vary due to age or medical condition, Ondansetron and Dexamethasone  Airway Management Planned: LMA  Additional Equipment: None  Intra-op Plan:   Post-operative Plan:   Informed Consent: I have reviewed the patients History and Physical, chart, labs and discussed the procedure including the risks, benefits and alternatives for the proposed anesthesia with the patient or authorized representative who has indicated his/her understanding and acceptance.     Dental advisory given  Plan Discussed with: CRNA and Anesthesiologist  Anesthesia Plan Comments:  ( )        Anesthesia Quick Evaluation

## 2020-09-21 ENCOUNTER — Encounter (HOSPITAL_BASED_OUTPATIENT_CLINIC_OR_DEPARTMENT_OTHER): Payer: Self-pay | Admitting: Urology

## 2020-09-21 LAB — SURGICAL PATHOLOGY

## 2020-09-23 DIAGNOSIS — D414 Neoplasm of uncertain behavior of bladder: Secondary | ICD-10-CM | POA: Diagnosis not present

## 2020-09-23 DIAGNOSIS — R3121 Asymptomatic microscopic hematuria: Secondary | ICD-10-CM | POA: Diagnosis not present

## 2020-10-08 ENCOUNTER — Telehealth: Payer: Self-pay | Admitting: Family Medicine

## 2020-10-08 NOTE — Telephone Encounter (Signed)
This patient only wants to see her doctor and does not want to be called anymore to schedule.

## 2020-10-08 NOTE — Telephone Encounter (Signed)
Left message for patient to call back and schedule Medicare Annual Wellness Visit (AWV) either virtually or in office.   AWV-S per [place] 03/29/15  please schedule at anytime with [facility] Nurse Health Advisor 1 or 2   This should be a 45 minute visit.

## 2020-10-29 ENCOUNTER — Telehealth: Payer: Self-pay | Admitting: Family Medicine

## 2020-10-29 NOTE — Telephone Encounter (Signed)
Left message for patient to call back and schedule Medicare Annual Wellness Visit (AWV) either virtually or in office.    AWV-S per [place] 03/29/15  please schedule at anytime with [facility] Nurse Health Advisor 1 or 2   This should be a 45 minute visit.

## 2020-11-02 ENCOUNTER — Other Ambulatory Visit (HOSPITAL_COMMUNITY): Payer: Medicare PPO

## 2020-11-03 ENCOUNTER — Ambulatory Visit: Payer: Self-pay | Admitting: Obstetrics & Gynecology

## 2020-11-11 ENCOUNTER — Other Ambulatory Visit: Payer: Self-pay | Admitting: Physician Assistant

## 2020-11-17 DIAGNOSIS — Z0289 Encounter for other administrative examinations: Secondary | ICD-10-CM

## 2020-12-01 ENCOUNTER — Encounter: Payer: Self-pay | Admitting: Family Medicine

## 2020-12-02 ENCOUNTER — Other Ambulatory Visit: Payer: Self-pay

## 2020-12-02 ENCOUNTER — Telehealth: Payer: Medicare PPO | Admitting: Family Medicine

## 2020-12-02 DIAGNOSIS — U071 COVID-19: Secondary | ICD-10-CM

## 2020-12-02 MED ORDER — NIRMATRELVIR/RITONAVIR (PAXLOVID)TABLET: 3.0000 | ORAL_TABLET | Freq: Two times a day (BID) | ORAL | 0 refills | Status: AC

## 2020-12-02 NOTE — Progress Notes (Signed)
Patient ID: [name], female   DOB: 03/18/1946, 75 y.o.   MRN: [id number]  This visit type was conducted due to national recommendations for restrictions regarding the COVID-19 pandemic in an effort to limit this patient's exposure and mitigate transmission in our community.   Virtual Visit via Video Note  I connected with [name] on 12/02/20 at  5:30 PM EDT by a video enabled telemedicine application and verified that I am speaking with the correct person using two identifiers.  Location patient: home Location provider:work or home office Persons participating in the virtual visit: patient, provider  I discussed the limitations of evaluation and management by telemedicine and the availability of in person appointments. The patient expressed understanding and agreed to proceed.   HPI: [name] has COVID-19 infection.  She states her husband, daughter, and grandchild have all tested positive and they all live together.  She developed symptoms Sunday.  She had chills, body aches, nasal congestion and cough along with some headache.  She initially thought this was a typical "cold ".  However, her body aches progressed and she had more flulike symptoms.  She denies any nausea, vomiting, or diarrhea.  Generally healthy.  She has remote history of ovarian cancer.  She does have atrial fibrillation and is on Eliquis and metoprolol.  History of normal renal function.   ROS: See pertinent positives and negatives per HPI.  Past Medical History:  Diagnosis Date   Anticoagulant long-term use    eliquis--- managed by cardiology   Arthritis    knees   Hematuria    History of cystitis [id number]   follicular cystitis s/p cysto bx   History of kidney stones    History of ovarian cancer in adulthood released from oncologist-- dr Jana Hakim per lov note in epic 07-11-2011 in remission   dx 01/ 2007;  02/ 2007 s/p exp. lap w/ TAH/ BSO/ Appendectomy/ Omectomy/ Splenectomy/ tumor debulking;  Stage  IIIC,  completed chemo 07/ 2007;     History of septic shock 2016   secondary to UTI and blood infection from dog bite, resolved   History of thrombosis    per pt had 2007 PAC occlusion due to blood clot, completed coumadin treatment, and PAC removed   Lesion of bladder    Nonischemic cardiomyopathy    Echo 7/21: EF 40-45, global HK, normal RVSF, mild BAE, mild MR // Myoview 9/21: EF 52, no ischemia, low risk    Persistent atrial fibrillation [facility]) cardiologist-- dr Jasper Riling   dx 08/ 2020 ;   S/p cardioversion 11/2018 //anticoagulation with Apixaban //Echo 11/2018: EF 45-50, LVH, GRII DD, mildly reduced RV SF, severe LAE   PONV (postoperative nausea and vomiting)    S/P splenectomy 05/03/2005   during surgery for extensive abdominal tumor debulking    Past Surgical History:  Procedure Laterality Date   BLEPHAROPLASTY Bilateral 07-04-2002  '@[facility]'$    UPPER EYELIDS   CARDIOVERSION N/A 12/17/2018   Procedure: CARDIOVERSION;  Surgeon: Buford Dresser, MD;  Location: [facility];  Service: Cardiovascular;  Laterality: N/A;   CATARACT EXTRACTION W/ INTRAOCULAR LENS  IMPLANT, BILATERAL  2000 approx   CESAREAN SECTION  1982;  [place] N/A 09/18/2020   Procedure: CYSTOSCOPY WITH BIOPSY FULGURATION;  Surgeon: Irine Seal, MD;  Location: [place];  Service: Urology;  Laterality: N/A;   EXPLORATORY LAPAROTOMY/  TOTAL ABDOMINAL HYSTERECTOMY/  BILATERAL SALPINGOOPHORECTOMY/  EXTENSIVE TUMOR DEBULKING/  APPENDECTOMY/  OMENTECTOMY/  SPLENECTOMY  05/03/2005   GASTRIC  BYPASS  1979   intestinal bariatric    LAPAROSCOPIC CHOLECYSTECTOMY  10-19-2009 @ [place]   PORT A CATH INSERTION AND REMOVAL  03/ 2007;  REMOVED 03/ 2008   TRANSURETHRAL RESECTION OF BLADDER  09-13-2005  '@[facility]'$  dr Jeffie Pollock   LESION   VENTRAL HERNIA REPAIR  04-04-2005  @ [place]    Family History  Problem Relation Age of Onset   Arthritis Mother        ?osteoarthritis   Cancer Father 5       lymphoma    Arthritis Other    Cancer Sister        ovaian cancer   Diabetes Sister        type 2     SOCIAL HX: Non-smoker   Current Outpatient Medications:    ELIQUIS 5 MG TABS tablet, TAKE 1 TABLET BY MOUTH TWICE A DAY (Patient taking differently: Take 5 mg by mouth 2 (two) times daily.), Disp: 60 tablet, Rfl: 5   metoprolol succinate (TOPROL-XL) 100 MG 24 hr tablet, Take 1 tablet (100 mg total) by mouth daily. To take in addition with 50 mg Metoprolol Succinate to equal 150 mg, Disp: 90 tablet, Rfl: 3   metoprolol tartrate (LOPRESSOR) 50 MG tablet, TAKE 1 TABLET (50 MG TOTAL) BY MOUTH DAILY. TO TAKE IN ADDITION WITH 100 MG METOPROLOL SUCCINATE TO EQUAL 150 MG, Disp: 90 tablet, Rfl: 3   Multiple Vitamin (MULTIVITAMIN) tablet, Take 1 tablet by mouth daily. , Disp: , Rfl:    nirmatrelvir/ritonavir EUA (PAXLOVID) 20 x 150 MG & 10 x '100MG'$  TABS, Take 3 tablets by mouth 2 (two) times daily for 5 days. (Take nirmatrelvir 150 mg two tablets twice daily for 5 days and ritonavir 100 mg one tablet twice daily for 5 days) Patient GFR is > 60, Disp: 30 tablet, Rfl: 0   zolpidem (AMBIEN) 10 MG tablet, TAKE 1 TABLET BY MOUTH EVERY DAY AT BEDTIME AS NEEDED (Patient taking differently: Take 10 mg by mouth at bedtime as needed.), Disp: 30 tablet, Rfl: 5  EXAM:  VITALS per patient if applicable:  GENERAL: alert, oriented, appears well and in no acute distress  HEENT: atraumatic, conjunttiva clear, no obvious abnormalities on inspection of external nose and ears  NECK: normal movements of the head and neck  LUNGS: on inspection no signs of respiratory distress, breathing rate appears normal, no obvious gross SOB, gasping or wheezing  CV: no obvious cyanosis  MS: moves all visible extremities without noticeable abnormality  PSYCH/NEURO: pleasant and cooperative, no obvious depression or anxiety, speech and thought processing grossly intact  ASSESSMENT AND PLAN:  Discussed the following assessment and  plan:  COVID-19 infection.  Patient nontoxic in appearance.  We discussed antiviral therapies.  She specifically had interest in Paxlovid.  We did mention potential interaction with Eliquis.  She denies any recent bleeding complications. -Start Paxlovid 3 capsules twice daily for 5 days -Plenty of fluids and rest -Follow-up for any increased dyspnea or other concerns.     I discussed the assessment and treatment plan with the patient. The patient was provided an opportunity to ask questions and all were answered. The patient agreed with the plan and demonstrated an understanding of the instructions.   The patient was advised to call back or seek an in-person evaluation if the symptoms worsen or if the condition fails to improve as anticipated.     Carolann Littler, MD

## 2020-12-03 ENCOUNTER — Other Ambulatory Visit (HOSPITAL_COMMUNITY): Payer: Medicare PPO

## 2020-12-15 ENCOUNTER — Other Ambulatory Visit: Payer: Self-pay | Admitting: Family Medicine

## 2020-12-23 ENCOUNTER — Other Ambulatory Visit (HOSPITAL_COMMUNITY): Payer: Medicare PPO

## 2020-12-30 ENCOUNTER — Ambulatory Visit: Payer: Medicare PPO | Admitting: Physician Assistant

## 2021-01-18 ENCOUNTER — Ambulatory Visit (INDEPENDENT_AMBULATORY_CARE_PROVIDER_SITE_OTHER): Payer: Medicare PPO

## 2021-01-18 ENCOUNTER — Other Ambulatory Visit: Payer: Self-pay

## 2021-01-18 DIAGNOSIS — Z23 Encounter for immunization: Secondary | ICD-10-CM

## 2021-01-22 ENCOUNTER — Other Ambulatory Visit: Payer: Self-pay | Admitting: Physician Assistant

## 2021-02-02 ENCOUNTER — Ambulatory Visit: Payer: Medicare PPO | Admitting: Obstetrics & Gynecology

## 2021-02-09 DIAGNOSIS — L814 Other melanin hyperpigmentation: Secondary | ICD-10-CM | POA: Diagnosis not present

## 2021-02-09 DIAGNOSIS — D225 Melanocytic nevi of trunk: Secondary | ICD-10-CM | POA: Diagnosis not present

## 2021-02-09 DIAGNOSIS — L570 Actinic keratosis: Secondary | ICD-10-CM | POA: Diagnosis not present

## 2021-02-09 DIAGNOSIS — L819 Disorder of pigmentation, unspecified: Secondary | ICD-10-CM | POA: Diagnosis not present

## 2021-02-09 DIAGNOSIS — L821 Other seborrheic keratosis: Secondary | ICD-10-CM | POA: Diagnosis not present

## 2021-02-09 DIAGNOSIS — L812 Freckles: Secondary | ICD-10-CM | POA: Diagnosis not present

## 2021-02-11 ENCOUNTER — Encounter: Payer: Self-pay | Admitting: Family Medicine

## 2021-02-11 ENCOUNTER — Telehealth: Payer: Self-pay | Admitting: Family Medicine

## 2021-02-11 NOTE — Telephone Encounter (Signed)
Spoke to patient to schedule Medicare Annual Wellness Visit (AWV) either virtually or in office.   Declined not interested    AWV-S per [place] 03/29/15  please schedule at anytime with [facility] Nurse Health Advisor 1 or 2   This should be a 45 minute visit.

## 2021-02-23 ENCOUNTER — Other Ambulatory Visit (HOSPITAL_COMMUNITY): Payer: Medicare PPO

## 2021-02-24 ENCOUNTER — Ambulatory Visit (INDEPENDENT_AMBULATORY_CARE_PROVIDER_SITE_OTHER): Payer: Medicare PPO | Admitting: Family Medicine

## 2021-02-24 ENCOUNTER — Encounter: Payer: Self-pay | Admitting: Family Medicine

## 2021-02-24 VITALS — BP 118/74 | HR 75 | Temp 97.4°F | Resp 18 | Ht 62.0 in | Wt 161.0 lb

## 2021-02-24 DIAGNOSIS — Z0000 Encounter for general adult medical examination without abnormal findings: Secondary | ICD-10-CM

## 2021-02-24 LAB — HEPATIC FUNCTION PANEL
ALT: 16 U/L (ref 0–35)
AST: 21 U/L (ref 0–37)
Albumin: 4 g/dL (ref 3.5–5.2)
Alkaline Phosphatase: 85 U/L (ref 39–117)
Bilirubin, Direct: 0.1 mg/dL (ref 0.0–0.3)
Total Bilirubin: 0.7 mg/dL (ref 0.2–1.2)
Total Protein: 7 g/dL (ref 6.0–8.3)

## 2021-02-24 LAB — LIPID PANEL
Cholesterol: 135 mg/dL (ref 0–200)
HDL: 56.3 mg/dL (ref >39.00)
LDL Cholesterol: 61 mg/dL (ref 0–99)
NonHDL: 78.58
Total CHOL/HDL Ratio: 2
Triglycerides: 87 mg/dL (ref 0.0–149.0)
VLDL: 17.4 mg/dL (ref 0.0–40.0)

## 2021-02-24 LAB — BASIC METABOLIC PANEL
BUN: 16 mg/dL (ref 6–23)
CO2: 26 mEq/L (ref 19–32)
Calcium: 9.5 mg/dL (ref 8.4–10.5)
Chloride: 106 mEq/L (ref 96–112)
Creatinine, Ser: 1.01 mg/dL (ref 0.40–1.20)
GFR: 55.17 mL/min — ABNORMAL LOW (ref >60.00)
Glucose, Bld: 91 mg/dL (ref 70–99)
Potassium: 4 mEq/L (ref 3.5–5.1)
Sodium: 139 mEq/L (ref 135–145)

## 2021-02-24 LAB — TSH: TSH: 3.41 u[IU]/mL (ref 0.35–5.50)

## 2021-02-24 MED ORDER — ZOLPIDEM TARTRATE 10 MG PO TABS: 10.0000 mg | ORAL_TABLET | Freq: Every evening | ORAL | 1 refills | Status: DC | PRN

## 2021-02-24 NOTE — Addendum Note (Signed)
Addended by: Rosalyn Gess D on: 02/24/2021 03:40 PM   Modules accepted: Orders

## 2021-02-24 NOTE — Progress Notes (Signed)
Established Patient Office Visit  Subjective:  Patient ID: [name], female    DOB: 04-29-1945  Age: 75 y.o. MRN: [id number]  CC:  Chief Complaint  Patient presents with   Annual Exam    CPE, AWV--pt is fasting     HPI [name] presents for physical exam.  She has chronic problems including history of atrial fibrillation, osteoporosis, history of kidney stones, remote history of ovarian cancer, chronic insomnia.  Generally doing well at this time.  She remains on metoprolol and Eliquis for her A. fib.  Health maintenance reviewed:  -Flu vaccine already given -Shingles vaccine already given -Mammogram due soon.  She plans to set up. -Declines tetanus -Pneumonia vaccines complete -Overdue for repeat colonoscopy -No history of hepatitis C screening but low risk.  Social history and family history reviewed with no significant changes.  Past Medical History:  Diagnosis Date   Anticoagulant long-term use    eliquis--- managed by cardiology   Arthritis    knees   Hematuria    History of cystitis 9563   follicular cystitis s/p cysto bx   History of kidney stones    History of ovarian cancer in adulthood released from oncologist-- dr Jana Hakim per lov note in epic 07-11-2011 in remission   dx 01/ 2007;  02/ 2007 s/p exp. lap w/ TAH/ BSO/ Appendectomy/ Omectomy/ Splenectomy/ tumor debulking;  Stage IIIC,  completed chemo 07/ 2007;     History of septic shock 2016   secondary to UTI and blood infection from dog bite, resolved   History of thrombosis    per pt had 2007 PAC occlusion due to blood clot, completed coumadin treatment, and PAC removed   Lesion of bladder    Nonischemic cardiomyopathy    Echo 7/21: EF 40-45, global HK, normal RVSF, mild BAE, mild MR // Myoview 9/21: EF 52, no ischemia, low risk    Persistent atrial fibrillation [facility]) cardiologist-- dr Jasper Riling   dx 08/ 2020 ;   S/p cardioversion 11/2018 //anticoagulation with Apixaban //Echo 11/2018: EF  45-50, LVH, GRII DD, mildly reduced RV SF, severe LAE   PONV (postoperative nausea and vomiting)    S/P splenectomy 05/03/2005   during surgery for extensive abdominal tumor debulking    Past Surgical History:  Procedure Laterality Date   BLEPHAROPLASTY Bilateral 07-04-2002  @[facility]    UPPER EYELIDS   CARDIOVERSION N/A 12/17/2018   Procedure: CARDIOVERSION;  Surgeon: Buford Dresser, MD;  Location: [place];  Service: Cardiovascular;  Laterality: N/A;   CATARACT EXTRACTION W/ INTRAOCULAR LENS  IMPLANT, BILATERAL  2000 approx   CESAREAN SECTION  1982;  [place] N/A 09/18/2020   Procedure: CYSTOSCOPY WITH BIOPSY FULGURATION;  Surgeon: Irine Seal, MD;  Location: [place];  Service: Urology;  Laterality: N/A;   EXPLORATORY LAPAROTOMY/  TOTAL ABDOMINAL HYSTERECTOMY/  BILATERAL SALPINGOOPHORECTOMY/  EXTENSIVE TUMOR DEBULKING/  APPENDECTOMY/  OMENTECTOMY/  SPLENECTOMY  05/03/2005   GASTRIC BYPASS  1979   intestinal bariatric    LAPAROSCOPIC CHOLECYSTECTOMY  10-19-2009 @ [place]   PORT A CATH INSERTION AND REMOVAL  03/ 2007;  REMOVED 03/ 2008   TRANSURETHRAL RESECTION OF BLADDER  09-13-2005  @[facility]  dr Jeffie Pollock   LESION   VENTRAL HERNIA REPAIR  04-04-2005  @ [place]    Family History  Problem Relation Age of Onset   Arthritis Mother        ?osteoarthritis   Cancer Father 38       lymphoma  Arthritis Other    Cancer Sister        ovaian cancer   Diabetes Sister        type 2     Social History   Socioeconomic History   Marital status: Married    Spouse name: Not on file   Number of children: Not on file   Years of education: Not on file   Highest education level: Not on file  Occupational History   Not on file  Tobacco Use   Smoking status: Never   Smokeless tobacco: Never  Vaping Use   Vaping Use: Never used  Substance and Sexual Activity   Alcohol use: No   Drug use: No   Sexual activity: Yes    Partners: Male    Birth  control/protection: Surgical    Comment: 1ST intercourse- 28, partners- 76, married- 35 yrs   Other Topics Concern   Not on file  Social History Narrative   Not on file   Social Determinants of Health   Financial Resource Strain: Not on file  Food Insecurity: Not on file  Transportation Needs: Not on file  Physical Activity: Not on file  Stress: Not on file  Social Connections: Not on file  Intimate Partner Violence: Not on file    Outpatient Medications Prior to Visit  Medication Sig Dispense Refill   ELIQUIS 5 MG TABS tablet TAKE 1 TABLET BY MOUTH TWICE A DAY 60 tablet 5   metoprolol succinate (TOPROL-XL) 100 MG 24 hr tablet TAKE 1 TABLET (100 MG TOTAL) BY MOUTH DAILY. TO TAKE IN ADDITION WITH 50 MG METOPROLOL SUCCINATE TO EQUAL 150 MG 90 tablet 3   metoprolol tartrate (LOPRESSOR) 50 MG tablet TAKE 1 TABLET (50 MG TOTAL) BY MOUTH DAILY. TO TAKE IN ADDITION WITH 100 MG METOPROLOL SUCCINATE TO EQUAL 150 MG 90 tablet 3   Multiple Vitamin (MULTIVITAMIN) tablet Take 1 tablet by mouth daily.      zolpidem (AMBIEN) 10 MG tablet TAKE 1 TABLET BY MOUTH EVERY DAY AT BEDTIME AS NEEDED (Patient taking differently: Take 10 mg by mouth at bedtime as needed.) 30 tablet 5   No facility-administered medications prior to visit.    Allergies  Allergen Reactions   Morphine And Related Nausea And Vomiting   Penicillins Hives    Did it involve swelling of the face/tongue/throat, SOB, or low BP? No Did it involve sudden or severe rash/hives, skin peeling, or any reaction on the inside of your mouth or nose? No Did you need to seek medical attention at a hospital or doctor's office? Unknown When did it last happen?      81 + years If all above answers are "NO", may proceed with cephalosporin use.     ROS Review of Systems  Constitutional:  Negative for activity change, appetite change, fatigue, fever and unexpected weight change.  HENT:  Negative for ear pain, hearing loss, sore throat and  trouble swallowing.   Eyes:  Negative for visual disturbance.  Respiratory:  Negative for cough and shortness of breath.   Cardiovascular:  Negative for chest pain and palpitations.  Gastrointestinal:  Negative for abdominal pain, blood in stool, constipation and diarrhea.  Genitourinary:  Negative for dysuria and hematuria.  Musculoskeletal:  Negative for arthralgias, back pain and myalgias.  Skin:  Negative for rash.  Neurological:  Negative for dizziness, syncope and headaches.  Hematological:  Negative for adenopathy.  Psychiatric/Behavioral:  Negative for confusion and dysphoric mood.      Objective:  Physical Exam Constitutional:      Appearance: She is well-developed.  HENT:     Head: Normocephalic and atraumatic.  Eyes:     Pupils: Pupils are equal, round, and reactive to light.  Neck:     Thyroid: No thyromegaly.  Cardiovascular:     Comments: Irregular rhythm consistent with her atrial fibrillation.  Rate controlled. Pulmonary:     Effort: No respiratory distress.     Breath sounds: Normal breath sounds. No wheezing or rales.  Abdominal:     General: Bowel sounds are normal. There is no distension.     Palpations: Abdomen is soft. There is no mass.     Tenderness: There is no abdominal tenderness. There is no guarding or rebound.  Musculoskeletal:        General: Normal range of motion.     Cervical back: Normal range of motion and neck supple.  Lymphadenopathy:     Cervical: No cervical adenopathy.  Skin:    Findings: No rash.  Neurological:     Mental Status: She is alert and oriented to person, place, and time.     Cranial Nerves: No cranial nerve deficit.  Psychiatric:        Behavior: Behavior normal.        Thought Content: Thought content normal.        Judgment: Judgment normal.    BP 118/74 (BP Location: Left Arm, Patient Position: Sitting, Cuff Size: Normal)   Pulse 75   Temp (!) 97.4 F (36.3 C) (Oral)   Resp 18   Ht 5\' 2"  (1.575 m)   Wt  161 lb (73 kg)   SpO2 98%   BMI 29.45 kg/m  Wt Readings from Last 3 Encounters:  02/24/21 161 lb (73 kg)  09/18/20 154 lb 8 oz (70.1 kg)  05/25/20 149 lb 9.6 oz (67.9 kg)     Health Maintenance Due  Topic Date Due   Hepatitis C Screening  Never done   COLONOSCOPY (Pts 45-67yrs Insurance coverage will need to be confirmed)  12/28/2017   COVID-19 Vaccine (4 - Booster for Pfizer series) 03/13/2020    There are no preventive care reminders to display for this patient.  Lab Results  Component Value Date   TSH 3.20 10/10/2018   Lab Results  Component Value Date   WBC 8.4 09/18/2019   HGB 14.3 09/18/2020   HCT 42.0 09/18/2020   MCV 98 (H) 09/18/2019   PLT 215 09/18/2019   Lab Results  Component Value Date   NA 142 09/18/2020   K 3.7 09/18/2020   CO2 22 09/18/2019   GLUCOSE 89 09/18/2020   BUN 14 09/18/2020   CREATININE 0.80 09/18/2020   BILITOT 0.7 10/10/2018   ALKPHOS 88 10/10/2018   AST 22 10/10/2018   ALT 17 10/10/2018   PROT 6.7 10/10/2018   ALBUMIN 4.1 10/10/2018   CALCIUM 9.6 09/18/2019   ANIONGAP 6 04/12/2014   GFR 42.61 (L) 10/10/2018   Lab Results  Component Value Date   CHOL 148 12/23/2011   Lab Results  Component Value Date   HDL 54.70 12/23/2011   Lab Results  Component Value Date   LDLCALC 83 12/23/2011   Lab Results  Component Value Date   TRIG 54.0 12/23/2011   Lab Results  Component Value Date   CHOLHDL 3 12/23/2011   No results found for: HGBA1C    Assessment & Plan:   Problem List Items Addressed This Visit   None Visit Diagnoses  Physical exam    -  Primary   Relevant Orders   Basic metabolic panel   Lipid panel   CBC with Differential/Platelet   TSH   Hepatic function panel   Hep C Antibody   Ambulatory referral to Gastroenterology     We discussed the following health maintenance issues  -Flu vaccine already given -Other vaccines up-to-date with exception of tetanus. -Set up repeat colonoscopy.  She has not  had colonoscopy in over 10 years. -Check labs as above including hepatitis C antibody. -Continue with annual mammograms  Meds ordered this encounter  Medications   zolpidem (AMBIEN) 10 MG tablet    Sig: Take 1 tablet (10 mg total) by mouth at bedtime as needed for sleep.    Dispense:  90 tablet    Refill:  1    Follow-up: No follow-ups on file.    Carolann Littler, MD

## 2021-02-25 ENCOUNTER — Other Ambulatory Visit (INDEPENDENT_AMBULATORY_CARE_PROVIDER_SITE_OTHER): Payer: Medicare PPO

## 2021-02-25 DIAGNOSIS — Z0000 Encounter for general adult medical examination without abnormal findings: Secondary | ICD-10-CM

## 2021-02-25 LAB — HEPATITIS C ANTIBODY
Hepatitis C Ab: NONREACTIVE
SIGNAL TO CUT-OFF: 0.03 (ref <1.00)

## 2021-02-25 LAB — CBC WITH DIFFERENTIAL/PLATELET
Basophils Absolute: 0.1 10*3/uL (ref 0.0–0.1)
Basophils Relative: 0.7 % (ref 0.0–3.0)
Eosinophils Absolute: 0.1 10*3/uL (ref 0.0–0.7)
Eosinophils Relative: 2 % (ref 0.0–5.0)
HCT: 41.3 % (ref 36.0–46.0)
Hemoglobin: 13.3 g/dL (ref 12.0–15.0)
Lymphocytes Relative: 25 % (ref 12.0–46.0)
Lymphs Abs: 1.8 10*3/uL (ref 0.7–4.0)
MCHC: 32.3 g/dL (ref 30.0–36.0)
MCV: 99.7 fl (ref 78.0–100.0)
Monocytes Absolute: 0.8 10*3/uL (ref 0.1–1.0)
Monocytes Relative: 10.4 % (ref 3.0–12.0)
Neutro Abs: 4.5 10*3/uL (ref 1.4–7.7)
Neutrophils Relative %: 61.9 % (ref 43.0–77.0)
Platelets: 261 10*3/uL (ref 150.0–400.0)
RBC: 4.14 Mil/uL (ref 3.87–5.11)
RDW: 13.1 % (ref 11.5–15.5)
WBC: 7.2 10*3/uL (ref 4.0–10.5)

## 2021-02-26 ENCOUNTER — Other Ambulatory Visit: Payer: Self-pay

## 2021-02-26 ENCOUNTER — Encounter: Payer: Self-pay | Admitting: Cardiovascular Disease

## 2021-02-26 ENCOUNTER — Ambulatory Visit: Payer: Medicare PPO | Admitting: Cardiovascular Disease

## 2021-02-26 VITALS — BP 102/58 | HR 70 | Ht 62.0 in | Wt 159.0 lb

## 2021-02-26 DIAGNOSIS — I4819 Other persistent atrial fibrillation: Secondary | ICD-10-CM

## 2021-02-26 NOTE — Patient Instructions (Addendum)
Medication Instructions:  Your physician recommends that you continue on your current medications as directed. Please refer to the Current Medication list given to you today.  *If you need a refill on your cardiac medications before your next appointment, please call your pharmacy*   Lab Work: NONE If you have labs (blood work) drawn today and your tests are completely normal, you will receive your results only by: [place] (if you have MyChart) OR A paper copy in the mail If you have any lab test that is abnormal or we need to change your treatment, we will call you to review the results.   Testing/Procedures: Echo for 03/04/21 has been canceled you will revisit having testing at 6 month follow up.   Follow-Up: At [facility], you and your health needs are our priority.  As part of our continuing mission to provide you with exceptional heart care, we have created designated Provider Care Teams.  These Care Teams include your primary Cardiologist (physician) and Advanced Practice Providers (APPs -  Physician Assistants and Nurse Practitioners) who all work together to provide you with the care you need, when you need it.   Your next appointment:   6 month(s)  The format for your next appointment:   In Person  Provider:   Richardson Dopp, PA-C

## 2021-02-26 NOTE — Progress Notes (Signed)
Cardiology Office Note:    Date:  02/26/2021   ID:  [name], DOB [birth year], MRN [id number]  PCP:  [name], MD  Cardiologist:   Bobbiejo Ishikawa Electrophysiologist:  None   Referring MD: [name], MD   Chief Complaint  Patient presents with   Atrial Fibrillation    Sept. 15, 2020   [name] is a 75 y.o. female with a recent diagnosis of atrial fib with RVR . We were asked to see her today by Dr. Elease Hashimoto for further evaluation of her atrial fib.   Cannot tell when she is in atrial fib or now She had AF with RVR  She was found to have AF RVR.    She found her rapid HR when she put on a pulse oxymeter and found her HR to be 160 .  Noticed palpitations when she was busy taking care of family who was visiting her from out of town.  Is also on Eliquis .  She is feeling better.   Is an ovarian cancer surviver.   Has neuropathy from chemo .   March 13, 2019:  [name] is seen today for follow-up visit for her atrial fibrillation.  She had a cardioversion on December 17, 2018. She feels much better after the cardioversion. She is gained a little bit of weight since we last saw her. Wt is 190  ( up 8 lbs from Sept, 2020)  Knows she needs to walk more   Feb. 28, 2022: [name] is seen today for follow up of her atrial fib  She was seen by Richardson Dopp, [place] in Aug. 2021. Wt today is 149 lbs ,  Feeling much better. Has had some ankle issues,  Cannot wak,  Bought a stationary bike  Has had some hematura.  Will likely need a cystoscopy .  She may hold her Eliquis for 2 days prior to cystoscopy   February 26, 2021: [name] is seen today for a follow-up visit.  She has a history of chronic atrial fibrillation.  Is on eliquis   No cp, no dyspnea  Walks and does stationary bike regularly   She wants to cancel the echo scheduled for next week   Past Medical History:  Diagnosis Date   Anticoagulant long-term use    eliquis--- managed by cardiology    Arthritis    knees   Hematuria    History of cystitis 5364   follicular cystitis s/p cysto bx   History of kidney stones    History of ovarian cancer in adulthood released from oncologist-- dr Jana Hakim per lov note in epic 07-11-2011 in remission   dx 01/ 2007;  02/ 2007 s/p exp. lap w/ TAH/ BSO/ Appendectomy/ Omectomy/ Splenectomy/ tumor debulking;  Stage IIIC,  completed chemo 07/ 2007;     History of septic shock 2016   secondary to UTI and blood infection from dog bite, resolved   History of thrombosis    per pt had 2007 PAC occlusion due to blood clot, completed coumadin treatment, and PAC removed   Lesion of bladder    Nonischemic cardiomyopathy    Echo 7/21: EF 40-45, global HK, normal RVSF, mild BAE, mild MR // Myoview 9/21: EF 52, no ischemia, low risk    Persistent atrial fibrillation [facility]) cardiologist-- dr Jasper Riling   dx 08/ 2020 ;   S/p cardioversion 11/2018 //anticoagulation with Apixaban //Echo 11/2018: EF 45-50, LVH, GRII DD, mildly reduced RV SF, severe LAE   PONV (postoperative nausea  and vomiting)    S/P splenectomy 05/03/2005   during surgery for extensive abdominal tumor debulking    Past Surgical History:  Procedure Laterality Date   BLEPHAROPLASTY Bilateral 07-04-2002  @[facility]    UPPER EYELIDS   CARDIOVERSION N/A 12/17/2018   Procedure: CARDIOVERSION;  Surgeon: Buford Dresser, MD;  Location: [place];  Service: Cardiovascular;  Laterality: N/A;   CATARACT EXTRACTION W/ INTRAOCULAR LENS  IMPLANT, BILATERAL  2000 approx   CESAREAN SECTION  1982;  [place] N/A 09/18/2020   Procedure: CYSTOSCOPY WITH BIOPSY FULGURATION;  Surgeon: Irine Seal, MD;  Location: [facility];  Service: Urology;  Laterality: N/A;   EXPLORATORY LAPAROTOMY/  TOTAL ABDOMINAL HYSTERECTOMY/  BILATERAL SALPINGOOPHORECTOMY/  EXTENSIVE TUMOR DEBULKING/  APPENDECTOMY/  OMENTECTOMY/  SPLENECTOMY  05/03/2005   GASTRIC BYPASS  1979   intestinal bariatric     LAPAROSCOPIC CHOLECYSTECTOMY  10-19-2009 @ [place]   PORT A CATH INSERTION AND REMOVAL  03/ 2007;  REMOVED 03/ 2008   TRANSURETHRAL RESECTION OF BLADDER  09-13-2005  @[facility]  dr Jeffie Pollock   LESION   VENTRAL HERNIA REPAIR  04-04-2005  @ [place]    Current Medications: Current Meds  Medication Sig   ELIQUIS 5 MG TABS tablet TAKE 1 TABLET BY MOUTH TWICE A DAY   metoprolol succinate (TOPROL-XL) 100 MG 24 hr tablet TAKE 1 TABLET (100 MG TOTAL) BY MOUTH DAILY. TO TAKE IN ADDITION WITH 50 MG METOPROLOL SUCCINATE TO EQUAL 150 MG   metoprolol tartrate (LOPRESSOR) 50 MG tablet TAKE 1 TABLET (50 MG TOTAL) BY MOUTH DAILY. TO TAKE IN ADDITION WITH 100 MG METOPROLOL SUCCINATE TO EQUAL 150 MG   Multiple Vitamin (MULTIVITAMIN) tablet Take 1 tablet by mouth daily.    zolpidem (AMBIEN) 10 MG tablet Take 1 tablet (10 mg total) by mouth at bedtime as needed for sleep.     Allergies:   Morphine and related and Penicillins   Social History   Socioeconomic History   Marital status: Married    Spouse name: Not on file   Number of children: Not on file   Years of education: Not on file   Highest education level: Not on file  Occupational History   Not on file  Tobacco Use   Smoking status: Never   Smokeless tobacco: Never  Vaping Use   Vaping Use: Never used  Substance and Sexual Activity   Alcohol use: No   Drug use: No   Sexual activity: Yes    Partners: Male    Birth control/protection: Surgical    Comment: 1ST intercourse- 28, partners- 46, married- 65 yrs   Other Topics Concern   Not on file  Social History Narrative   Not on file   Social Determinants of Health   Financial Resource Strain: Not on file  Food Insecurity: Not on file  Transportation Needs: Not on file  Physical Activity: Not on file  Stress: Not on file  Social Connections: Not on file     Family History: The patient's family history includes Arthritis in her mother and another family member; Cancer in her sister; Cancer (age of  onset: 17) in her father; Diabetes in her sister.  ROS:   Please see the history of present illness.     All other systems reviewed and are negative.  EKGs/Labs/Other Studies Reviewed:    The following studies were reviewed today:   EKG: February 26, 2021: Atrial fibrillation with heart rate of 70.  No ST or T wave  changes.  Recent Labs: 02/24/2021: ALT 16; BUN 16; Creatinine, Ser 1.01; Potassium 4.0; Sodium 139; TSH 3.41 02/25/2021: Hemoglobin 13.3; Platelets 261.0  Recent Lipid Panel    Component Value Date/Time   CHOL 135 02/24/2021 1015   TRIG 87.0 02/24/2021 1015   HDL 56.30 02/24/2021 1015   CHOLHDL 2 02/24/2021 1015   VLDL 17.4 02/24/2021 1015   LDLCALC 61 02/24/2021 1015    Physical Exam:    Physical Exam: Blood pressure (!) 102/58, pulse 70, height 5\' 2"  (1.575 m), weight 159 lb (72.1 kg), SpO2 98 %.  GEN:  Well nourished, well developed in no acute distress HEENT: Normal NECK: No JVD; No carotid bruits LYMPHATICS: No lymphadenopathy CARDIAC: irreg. Irreg.  RESPIRATORY:  Clear to auscultation without rales, wheezing or rhonchi  ABDOMEN: Soft, non-tender, non-distended MUSCULOSKELETAL:  No edema; No deformity  SKIN: Warm and dry NEUROLOGIC:  Alert and oriented x 3     ASSESSMENT:    1. Persistent atrial fibrillation ([place])     PLAN:      1.  Atrial fib: She has chronic atrial fibrillation.  She is on Eliquis.  She is not having any issues.  She is had mildly reduced left ventricular systolic function on her previous echo.  She is completely asymptomatic.  She was originally scheduled to have an echocardiogram next week but she would like to postpone that.  Will have her follow-up with Richardson Dopp, PA in 6 months.  They can discuss rescheduling the echocardiogram at that time.  Overall think she is doing great.  Continue with her diet, exercise, weight loss efforts.   2.  Obesity:   3.  Mild systolic congestive heart failure:     Medication  Adjustments/Labs and Tests Ordered: Current medicines are reviewed at length with the patient today.  Concerns regarding medicines are outlined above.  Orders Placed This Encounter  Procedures   EKG 12-Lead    No orders of the defined types were placed in this encounter.    Patient Instructions  Medication Instructions:  Your physician recommends that you continue on your current medications as directed. Please refer to the Current Medication list given to you today.  *If you need a refill on your cardiac medications before your next appointment, please call your pharmacy*   Lab Work: NONE If you have labs (blood work) drawn today and your tests are completely normal, you will receive your results only by: [place] (if you have MyChart) OR A paper copy in the mail If you have any lab test that is abnormal or we need to change your treatment, we will call you to review the results.   Testing/Procedures: Echo for 03/04/21 has been canceled you will revisit having testing at 6 month follow up.   Follow-Up: At [facility], you and your health needs are our priority.  As part of our continuing mission to provide you with exceptional heart care, we have created designated Provider Care Teams.  These Care Teams include your primary Cardiologist (physician) and Advanced Practice Providers (APPs -  Physician Assistants and Nurse Practitioners) who all work together to provide you with the care you need, when you need it.   Your next appointment:   6 month(s)  The format for your next appointment:   In Person  Provider:   Richardson Dopp, PA-C          Signed, Mertie Moores, MD  02/26/2021 5:25 PM    [place]

## 2021-03-04 ENCOUNTER — Other Ambulatory Visit (HOSPITAL_COMMUNITY): Payer: Medicare PPO

## 2021-03-23 ENCOUNTER — Other Ambulatory Visit: Payer: Self-pay | Admitting: Obstetrics & Gynecology

## 2021-03-23 DIAGNOSIS — Z1231 Encounter for screening mammogram for malignant neoplasm of breast: Secondary | ICD-10-CM

## 2021-04-01 DIAGNOSIS — L570 Actinic keratosis: Secondary | ICD-10-CM | POA: Diagnosis not present

## 2021-04-15 ENCOUNTER — Other Ambulatory Visit: Payer: Self-pay

## 2021-04-15 ENCOUNTER — Ambulatory Visit
Admission: RE | Admit: 2021-04-15 | Discharge: 2021-04-15 | Disposition: A | Discharge status: Home / Self Care | Payer: Medicare PPO | Source: Ambulatory Visit | Attending: Obstetrics & Gynecology | Admitting: Obstetrics & Gynecology

## 2021-04-15 DIAGNOSIS — Z1231 Encounter for screening mammogram for malignant neoplasm of breast: Secondary | ICD-10-CM | POA: Diagnosis not present

## 2021-04-16 ENCOUNTER — Other Ambulatory Visit: Payer: Self-pay | Admitting: Obstetrics & Gynecology

## 2021-04-16 DIAGNOSIS — R928 Other abnormal and inconclusive findings on diagnostic imaging of breast: Secondary | ICD-10-CM

## 2021-04-19 ENCOUNTER — Other Ambulatory Visit: Payer: Self-pay

## 2021-04-19 ENCOUNTER — Ambulatory Visit (INDEPENDENT_AMBULATORY_CARE_PROVIDER_SITE_OTHER): Payer: Medicare PPO | Admitting: Obstetrics & Gynecology

## 2021-04-19 ENCOUNTER — Encounter: Payer: Self-pay | Admitting: Obstetrics & Gynecology

## 2021-04-19 VITALS — BP 116/64 | HR 77 | Ht 62.0 in | Wt 164.0 lb

## 2021-04-19 DIAGNOSIS — Z9079 Acquired absence of other genital organ(s): Secondary | ICD-10-CM

## 2021-04-19 DIAGNOSIS — Z90710 Acquired absence of both cervix and uterus: Secondary | ICD-10-CM

## 2021-04-19 DIAGNOSIS — Z9189 Other specified personal risk factors, not elsewhere classified: Secondary | ICD-10-CM

## 2021-04-19 DIAGNOSIS — Z90722 Acquired absence of ovaries, bilateral: Secondary | ICD-10-CM

## 2021-04-19 DIAGNOSIS — Z8543 Personal history of malignant neoplasm of ovary: Secondary | ICD-10-CM | POA: Diagnosis not present

## 2021-04-19 DIAGNOSIS — Z01419 Encounter for gynecological examination (general) (routine) without abnormal findings: Secondary | ICD-10-CM | POA: Diagnosis not present

## 2021-04-19 DIAGNOSIS — M810 Age-related osteoporosis without current pathological fracture: Secondary | ICD-10-CM

## 2021-04-19 NOTE — Progress Notes (Signed)
[name] [birth year] [id number]   History:    76 y.o. G3P2A1L2  Married.  Retired Probation officer  2 grand-children 64 and 64 yo.   RP:  Established patient presenting for annual gyn exam    HPI: Status post TAH/BSO staging, appendectomy, splenectomy with Dr. Fermin Schwab in 2007 for ovarian cancer stage III.  Chemotherapy after for 9 weeks.  Ca125 has been negative since then.  Will repeat today.  No pelvic pain.  Pap test negative in 09/2019.  Breast normal.  Last mammo 03/2021 Lt breast Neg/Asymmetry of the Rt breast, Dx mammo/poss [place] scheduled for 04/2021. BMI improved to 30.0.  On [employer].  Active physically, but difficulty doing fitness because of Arthritis.  Health labs with family physician.  BD 01/2018 Osteopenia, schedule BD here now.    Past medical history,surgical history, family history and social history were all reviewed and documented in the EPIC chart.  Gynecologic History No LMP recorded. Patient has had a hysterectomy.  Obstetric History OB History  Gravida Para Term Preterm AB Living  3 2 2   1 2   SAB IAB Ectopic Multiple Live Births  1       2    # Outcome Date GA Lbr Len/2nd Weight Sex Delivery Anes PTL Lv  3 Term 1982 [redacted]w[redacted]d  6 lb 11 oz (3.033 kg) M CS-LTranv Spinal  LIV  2 Term 1976 [redacted]w[redacted]d  7 lb 9 oz (3.43 kg) F CS-LTranv Spinal  LIV  1 SAB         DEC     ROS: A ROS was performed and pertinent positives and negatives are included in the history.  GENERAL: No fevers or chills. HEENT: No change in vision, no earache, sore throat or sinus congestion. NECK: No pain or stiffness. CARDIOVASCULAR: No chest pain or pressure. No palpitations. PULMONARY: No shortness of breath, cough or wheeze. GASTROINTESTINAL: No abdominal pain, nausea, vomiting or diarrhea, melena or bright red blood per rectum. GENITOURINARY: No urinary frequency, urgency, hesitancy or dysuria. MUSCULOSKELETAL: No joint or muscle pain, no back pain, no recent trauma. DERMATOLOGIC: No rash, no  itching, no lesions. ENDOCRINE: No polyuria, polydipsia, no heat or cold intolerance. No recent change in weight. HEMATOLOGICAL: No anemia or easy bruising or bleeding. NEUROLOGIC: No headache, seizures, numbness, tingling or weakness. PSYCHIATRIC: No depression, no loss of interest in normal activity or change in sleep pattern.     Exam:   BP 116/64    Pulse 77    Ht 5\' 2"  (1.575 m)    Wt 164 lb (74.4 kg)    SpO2 100%    BMI 30.00 kg/m   Body mass index is 30 kg/m.  General appearance : Well developed well nourished female. No acute distress HEENT: Eyes: no retinal hemorrhage or exudates,  Neck supple, trachea midline, no carotid bruits, no thyroidmegaly Lungs: Clear to auscultation, no rhonchi or wheezes, or rib retractions  Heart: Regular rate and rhythm, no murmurs or gallops Breast:Examined in sitting and supine position were symmetrical in appearance, no palpable masses or tenderness,  no skin retraction, no nipple inversion, no nipple discharge, no skin discoloration, no axillary or supraclavicular lymphadenopathy Abdomen: no palpable masses or tenderness, no rebound or guarding Extremities: no edema or skin discoloration or tenderness  Pelvic: Vulva: Normal             Vagina: No gross lesions or discharge  Cervix/Uterus absent  Adnexa  Without masses or tenderness  Anus: Normal   Assessment/Plan:  76 y.o. female for annual exam   1. Well female exam with routine gynecological exam Status post TAH/BSO staging, appendectomy, splenectomy with Dr. Fermin Schwab in 2007 for ovarian cancer stage III.  Chemotherapy after for 9 weeks.  Ca125 has been negative since then. Will repeat today.  No pelvic pain.  Pap test negative in 09/2019.  Breast normal.  Last mammo 03/2021 Lt breast Neg/Asymmetry of the Rt breast, Dx mammo/poss [place] scheduled for 04/2021. BMI improved to 30.0.  On [employer].  Active physically, but difficulty doing fitness because of Arthritis.  Health labs with family physician.   BD 01/2018 Osteoporosis, schedule BD here now.  2. At risk of fracture due to osteoporosis  3. Status post total abdominal hysterectomy and bilateral salpingo-oophorectomy (TAH-BSO) Staging Ovarian Cancer in 2007.    4. NEOPLASM, MALIGNANT, OVARY, HX OF Last Ca 125 Neg at 6 in 2021.  Will repeat today. - CA 125  5. Age-related osteoporosis without current pathological fracture  BD 01/2018 Osteoporosis, schedule BD here now. Not on Bone meds currently.  Vit D, Ca++ 1.5 g/d total, regular weight bearing physical activities. - DG Bone Density; Future    Princess Bruins MD, 10:44 AM 04/19/2021

## 2021-04-20 LAB — CA 125: CA 125: 5 U/mL (ref <35)

## 2021-05-03 ENCOUNTER — Ambulatory Visit: Payer: Medicare PPO

## 2021-05-03 ENCOUNTER — Ambulatory Visit
Admission: RE | Admit: 2021-05-03 | Discharge: 2021-05-03 | Disposition: A | Discharge status: Home / Self Care | Payer: Medicare PPO | Source: Ambulatory Visit | Attending: Obstetrics & Gynecology | Admitting: Obstetrics & Gynecology

## 2021-05-03 DIAGNOSIS — R922 Inconclusive mammogram: Secondary | ICD-10-CM | POA: Diagnosis not present

## 2021-05-03 DIAGNOSIS — R928 Other abnormal and inconclusive findings on diagnostic imaging of breast: Secondary | ICD-10-CM

## 2021-05-05 ENCOUNTER — Encounter: Payer: Self-pay | Admitting: Family Medicine

## 2021-05-07 ENCOUNTER — Ambulatory Visit: Payer: Medicare PPO | Admitting: Obstetrics & Gynecology

## 2021-05-21 DIAGNOSIS — H04123 Dry eye syndrome of bilateral lacrimal glands: Secondary | ICD-10-CM | POA: Diagnosis not present

## 2021-05-25 ENCOUNTER — Encounter: Payer: Self-pay | Admitting: Family Medicine

## 2021-05-25 ENCOUNTER — Ambulatory Visit: Payer: Medicare PPO | Admitting: Family Medicine

## 2021-05-25 ENCOUNTER — Other Ambulatory Visit: Payer: Self-pay

## 2021-05-25 ENCOUNTER — Ambulatory Visit (INDEPENDENT_AMBULATORY_CARE_PROVIDER_SITE_OTHER): Payer: Medicare PPO

## 2021-05-25 VITALS — BP 84/60 | HR 91 | Temp 98.3°F | Ht 62.0 in | Wt 160.4 lb

## 2021-05-25 DIAGNOSIS — R0989 Other specified symptoms and signs involving the circulatory and respiratory systems: Secondary | ICD-10-CM | POA: Diagnosis not present

## 2021-05-25 DIAGNOSIS — J189 Pneumonia, unspecified organism: Secondary | ICD-10-CM

## 2021-05-25 DIAGNOSIS — R531 Weakness: Secondary | ICD-10-CM

## 2021-05-25 DIAGNOSIS — R059 Cough, unspecified: Secondary | ICD-10-CM | POA: Diagnosis not present

## 2021-05-25 DIAGNOSIS — R52 Pain, unspecified: Secondary | ICD-10-CM | POA: Diagnosis not present

## 2021-05-25 LAB — POC COVID19 BINAXNOW: SARS Coronavirus 2 Ag: NEGATIVE

## 2021-05-25 LAB — POCT INFLUENZA A/B
Influenza A, POC: NEGATIVE
Influenza B, POC: NEGATIVE

## 2021-05-25 MED ORDER — LEVOFLOXACIN 500 MG PO TABS: 500.0000 mg | ORAL_TABLET | Freq: Every day | ORAL | 0 refills | Status: AC

## 2021-05-25 NOTE — Progress Notes (Signed)
Established Patient Office Visit  Subjective:  Patient ID: [name], female    DOB: 01-03-1946  Age: 76 y.o. MRN: [id number]  CC:  Chief Complaint  Patient presents with   Generalized Body Aches    X3 days   Nausea   Diarrhea    X2 days   Cough    Non-productive x5 days, productive with brown sputum today, tried Robitussin with no relief   Chills    Temperature of 98.2 at home   Shortness of Breath    HPI [name] presents for onset last Thursday of symptoms including headache, congestion, cough, nausea without vomiting.  She had some intermittent chills but no documented fever.  She has had a couple days of intermittent diarrhea.  Her diarrhea did not start till Saturday and improved with Imodium.  She has been keeping down fluids well but poor appetite.  Overall weakness and fatigue.  She states she is drinking lots of fluids.  She coughed up some brown appearing sputum earlier today and felt slightly better.  Denies any consistent orthostatic symptoms.  Has not done any home COVID testing.  No sick contacts.  Has had some generalized body aches and shortness of breath which has been mild with activity but not at rest.  She has allergy to penicillin.  Denies any recent abdominal pain.  No dysuria.  Past Medical History:  Diagnosis Date   Anticoagulant long-term use    eliquis--- managed by cardiology   Arthritis    knees   Hematuria    History of cystitis 2440   follicular cystitis s/p cysto bx   History of kidney stones    History of ovarian cancer in adulthood released from oncologist-- dr Jana Hakim per lov note in epic 07-11-2011 in remission   dx 01/ 2007;  02/ 2007 s/p exp. lap w/ TAH/ BSO/ Appendectomy/ Omectomy/ Splenectomy/ tumor debulking;  Stage IIIC,  completed chemo 07/ 2007;     History of septic shock 2016   secondary to UTI and blood infection from dog bite, resolved   History of thrombosis    per pt had 2007 PAC occlusion due to blood clot,  completed coumadin treatment, and PAC removed   Lesion of bladder    Nonischemic cardiomyopathy    Echo 7/21: EF 40-45, global HK, normal RVSF, mild BAE, mild MR // Myoview 9/21: EF 52, no ischemia, low risk    Persistent atrial fibrillation [facility]) cardiologist-- dr Jasper Riling   dx 08/ 2020 ;   S/p cardioversion 11/2018 //anticoagulation with Apixaban //Echo 11/2018: EF 45-50, LVH, GRII DD, mildly reduced RV SF, severe LAE   PONV (postoperative nausea and vomiting)    S/P splenectomy 05/03/2005   during surgery for extensive abdominal tumor debulking    Past Surgical History:  Procedure Laterality Date   BLEPHAROPLASTY Bilateral 07-04-2002  @[facility]    UPPER EYELIDS   CARDIOVERSION N/A 12/17/2018   Procedure: CARDIOVERSION;  Surgeon: Buford Dresser, MD;  Location: [facility];  Service: Cardiovascular;  Laterality: N/A;   CATARACT EXTRACTION W/ INTRAOCULAR LENS  IMPLANT, BILATERAL  2000 approx   CESAREAN SECTION  1982;  [place] N/A 09/18/2020   Procedure: CYSTOSCOPY WITH BIOPSY FULGURATION;  Surgeon: Irine Seal, MD;  Location: [facility];  Service: Urology;  Laterality: N/A;   EXPLORATORY LAPAROTOMY/  TOTAL ABDOMINAL HYSTERECTOMY/  BILATERAL SALPINGOOPHORECTOMY/  EXTENSIVE TUMOR DEBULKING/  APPENDECTOMY/  OMENTECTOMY/  SPLENECTOMY  05/03/2005   GASTRIC BYPASS  1979  intestinal bariatric    LAPAROSCOPIC CHOLECYSTECTOMY  10-19-2009 @ [place]   PORT A CATH INSERTION AND REMOVAL  03/ 2007;  REMOVED 03/ 2008   TRANSURETHRAL RESECTION OF BLADDER  09-13-2005  @[facility]  dr Jeffie Pollock   LESION   VENTRAL HERNIA REPAIR  04-04-2005  @ [facility]    Family History  Problem Relation Age of Onset   Arthritis Mother        ?osteoarthritis   Cancer Father 55       lymphoma   Arthritis Other    Cancer Sister        ovaian cancer   Diabetes Sister        type 2     Social History   Socioeconomic History   Marital status: Married    Spouse name: Not on file   Number of  children: Not on file   Years of education: Not on file   Highest education level: Master's degree (e.g., MA, MS, MEng, MEd, MSW, MBA)  Occupational History   Not on file  Tobacco Use   Smoking status: Never   Smokeless tobacco: Never  Vaping Use   Vaping Use: Never used  Substance and Sexual Activity   Alcohol use: No   Drug use: No   Sexual activity: Yes    Partners: Male    Birth control/protection: Surgical    Comment: 1ST intercourse- 28, partners- 41, married- 24 yrs   Other Topics Concern   Not on file  Social History Narrative   Not on file   Social Determinants of Health   Financial Resource Strain: Low Risk    Difficulty of Paying Living Expenses: Not hard at all  Food Insecurity: No Food Insecurity   Worried About Charity fundraiser in the Last Year: Never true   Arboriculturist in the Last Year: Never true  Transportation Needs: No Transportation Needs   Lack of Transportation (Medical): No   Lack of Transportation (Non-Medical): No  Physical Activity: Sufficiently Active   Days of Exercise per Week: 5 days   Minutes of Exercise per Session: 30 min  Stress: No Stress Concern Present   Feeling of Stress : Not at all  Social Connections: Unknown   Frequency of Communication with Friends and Family: More than three times a week   Frequency of Social Gatherings with Friends and Family: More than three times a week   Attends Religious Services: Patient refused   Marine scientist or Organizations: Yes   Attends Archivist Meetings: 1 to 4 times per year   Marital Status: Married  Human resources officer Violence: Not on file    Outpatient Medications Prior to Visit  Medication Sig Dispense Refill   ELIQUIS 5 MG TABS tablet TAKE 1 TABLET BY MOUTH TWICE A DAY 60 tablet 5   metoprolol succinate (TOPROL-XL) 100 MG 24 hr tablet TAKE 1 TABLET (100 MG TOTAL) BY MOUTH DAILY. TO TAKE IN ADDITION WITH 50 MG METOPROLOL SUCCINATE TO EQUAL 150 MG 90 tablet 3    metoprolol tartrate (LOPRESSOR) 50 MG tablet TAKE 1 TABLET (50 MG TOTAL) BY MOUTH DAILY. TO TAKE IN ADDITION WITH 100 MG METOPROLOL SUCCINATE TO EQUAL 150 MG 90 tablet 3   Multiple Vitamin (MULTIVITAMIN) tablet Take 1 tablet by mouth daily.      zolpidem (AMBIEN) 10 MG tablet Take 1 tablet (10 mg total) by mouth at bedtime as needed for sleep. 90 tablet 1   No facility-administered medications prior to  visit.    Allergies  Allergen Reactions   Morphine And Related Nausea And Vomiting   Penicillins Hives    Did it involve swelling of the face/tongue/throat, SOB, or low BP? No Did it involve sudden or severe rash/hives, skin peeling, or any reaction on the inside of your mouth or nose? No Did you need to seek medical attention at a hospital or doctor's office? Unknown When did it last happen?      24 + years If all above answers are "NO", may proceed with cephalosporin use.     ROS Review of Systems  Constitutional:  Positive for chills and fatigue. Negative for fever.  Respiratory:  Positive for cough. Negative for wheezing.   Cardiovascular:  Negative for chest pain, palpitations and leg swelling.  Gastrointestinal:  Positive for diarrhea and nausea. Negative for abdominal pain, blood in stool and vomiting.  Genitourinary:  Negative for dysuria.  Neurological:  Negative for syncope.  Psychiatric/Behavioral:  Negative for confusion.      Objective:    Physical Exam Vitals reviewed.  Constitutional:      General: She is not in acute distress.    Appearance: She is not ill-appearing or toxic-appearing.  Cardiovascular:     Rate and Rhythm: Normal rate and regular rhythm.  Pulmonary:     Effort: Pulmonary effort is normal.     Comments: She has some faint crackles in her right base.  No wheezes.  No increased work of breathing.  Pulse oximetry 93% room air at rest. Musculoskeletal:     Cervical back: Neck supple.  Neurological:     Mental Status: She is alert.    BP (!)  84/60 (BP Location: Left Arm, Patient Position: Sitting, Cuff Size: Normal)    Pulse 91    Temp 98.3 F (36.8 C) (Oral)    Ht 5\' 2"  (1.575 m)    Wt 160 lb 6.4 oz (72.8 kg)    SpO2 93%    BMI 29.34 kg/m  Wt Readings from Last 3 Encounters:  05/25/21 160 lb 6.4 oz (72.8 kg)  04/19/21 164 lb (74.4 kg)  02/26/21 159 lb (72.1 kg)     Health Maintenance Due  Topic Date Due   COLONOSCOPY (Pts 45-66yrs Insurance coverage will need to be confirmed)  12/28/2017    There are no preventive care reminders to display for this patient.  Lab Results  Component Value Date   TSH 3.41 02/24/2021   Lab Results  Component Value Date   WBC 7.2 02/25/2021   HGB 13.3 02/25/2021   HCT 41.3 02/25/2021   MCV 99.7 02/25/2021   PLT 261.0 02/25/2021   Lab Results  Component Value Date   NA 139 02/24/2021   K 4.0 02/24/2021   CO2 26 02/24/2021   GLUCOSE 91 02/24/2021   BUN 16 02/24/2021   CREATININE 1.01 02/24/2021   BILITOT 0.7 02/24/2021   ALKPHOS 85 02/24/2021   AST 21 02/24/2021   ALT 16 02/24/2021   PROT 7.0 02/24/2021   ALBUMIN 4.0 02/24/2021   CALCIUM 9.5 02/24/2021   ANIONGAP 6 04/12/2014   GFR 55.17 (L) 02/24/2021   Lab Results  Component Value Date   CHOL 135 02/24/2021   Lab Results  Component Value Date   HDL 56.30 02/24/2021   Lab Results  Component Value Date   LDLCALC 61 02/24/2021   Lab Results  Component Value Date   TRIG 87.0 02/24/2021   Lab Results  Component Value Date   CHOLHDL  2 02/24/2021   No results found for: HGBA1C    Assessment & Plan:   Problem List Items Addressed This Visit   None Visit Diagnoses     Weakness    -  Primary   Relevant Orders   CBC with Differential/Platelet   Basic metabolic panel   DG Chest 2 View   Cough, unspecified type       Relevant Orders   POC COVID-19 (Completed)   POC Influenza A/B (Completed)   Body aches       Relevant Orders   POC COVID-19 (Completed)   POC Influenza A/B (Completed)   Community  acquired pneumonia of right lung, unspecified part of lung       Relevant Medications   levofloxacin (LEVAQUIN) 500 MG tablet     Patient presents with acute respiratory symptoms which started last week.  She had some nausea without vomiting.  Infrequent diarrhea symptoms which seem to be somewhat secondary.  COVID testing and flu testing here today negative.  Patient is in no respiratory distress and nontoxic in appearance.  She is not tachycardic.  Initial blood pressure slightly low but repeat left arm seated at rest 100/64.  Keeping down fluids well.  Chest x-ray shows concern for infiltrate right upper and middle lobe.  This will be over read.  -We discussed indications for hospitalization.  She is not hypoxic.  She is not confused.  Keeping down fluids well.  In no respiratory distress. -Start Levaquin 500 mg once daily for 7 days.  She is penicillin allergic. -Low threshold to admit for any worsening symptoms. -As long as she is improving we will plan to see back in 3 days on Friday to reassess -Continue Zofran as needed for nausea -CBC and basic metabolic panel were drawn as well today -Continue good hydration  40 minutes were spent evaluating symptoms, reviewing x-ray results, discussing medications, discussing things to watch for which would prompt admission.  Meds ordered this encounter  Medications   levofloxacin (LEVAQUIN) 500 MG tablet    Sig: Take 1 tablet (500 mg total) by mouth daily for 7 days.    Dispense:  7 tablet    Refill:  0    Follow-up: Return in about 3 days (around 05/28/2021).    Carolann Littler, MD

## 2021-05-26 LAB — CBC WITH DIFFERENTIAL/PLATELET
Basophils Absolute: 0.1 10*3/uL (ref 0.0–0.1)
Basophils Relative: 0.4 % (ref 0.0–3.0)
Eosinophils Absolute: 0.3 10*3/uL (ref 0.0–0.7)
Eosinophils Relative: 1.8 % (ref 0.0–5.0)
HCT: 40.2 % (ref 36.0–46.0)
Hemoglobin: 13.2 g/dL (ref 12.0–15.0)
Lymphocytes Relative: 6.4 % — ABNORMAL LOW (ref 12.0–46.0)
Lymphs Abs: 1 10*3/uL (ref 0.7–4.0)
MCHC: 32.8 g/dL (ref 30.0–36.0)
MCV: 98.5 fl (ref 78.0–100.0)
Monocytes Absolute: 1.5 10*3/uL — ABNORMAL HIGH (ref 0.1–1.0)
Monocytes Relative: 9.6 % (ref 3.0–12.0)
Neutro Abs: 12.8 10*3/uL — ABNORMAL HIGH (ref 1.4–7.7)
Neutrophils Relative %: 81.8 % — ABNORMAL HIGH (ref 43.0–77.0)
Platelets: 251 10*3/uL (ref 150.0–400.0)
RBC: 4.09 Mil/uL (ref 3.87–5.11)
RDW: 13.4 % (ref 11.5–15.5)
WBC: 15.7 10*3/uL — ABNORMAL HIGH (ref 4.0–10.5)

## 2021-05-26 LAB — BASIC METABOLIC PANEL
BUN: 17 mg/dL (ref 6–23)
CO2: 22 mEq/L (ref 19–32)
Calcium: 8.6 mg/dL (ref 8.4–10.5)
Chloride: 101 mEq/L (ref 96–112)
Creatinine, Ser: 1.33 mg/dL — ABNORMAL HIGH (ref 0.40–1.20)
GFR: 39.58 mL/min — ABNORMAL LOW (ref >60.00)
Glucose, Bld: 110 mg/dL — ABNORMAL HIGH (ref 70–99)
Potassium: 3.3 mEq/L — ABNORMAL LOW (ref 3.5–5.1)
Sodium: 135 mEq/L (ref 135–145)

## 2021-05-28 ENCOUNTER — Ambulatory Visit: Payer: Medicare PPO | Admitting: Family Medicine

## 2021-05-28 ENCOUNTER — Other Ambulatory Visit: Payer: Self-pay | Admitting: Family Medicine

## 2021-05-28 ENCOUNTER — Encounter: Payer: Self-pay | Admitting: Family Medicine

## 2021-05-28 VITALS — BP 110/68 | HR 91 | Temp 98.0°F | Ht 62.0 in | Wt 161.4 lb

## 2021-05-28 DIAGNOSIS — J189 Pneumonia, unspecified organism: Secondary | ICD-10-CM

## 2021-05-28 DIAGNOSIS — R062 Wheezing: Secondary | ICD-10-CM

## 2021-05-28 MED ORDER — ALBUTEROL SULFATE HFA 108 (90 BASE) MCG/ACT IN AERS: 2.0000 | INHALATION_SPRAY | Freq: Four times a day (QID) | RESPIRATORY_TRACT | 0 refills | Status: DC | PRN

## 2021-05-28 NOTE — Patient Instructions (Signed)
Consider OTC Mucinex 1,200 mg twice daily ? ?Use the Albuterol every 4 to 6 hours as needed for wheezing. ? ?Set up repeat CXR for about 3 weeks.Marland Kitchen ?

## 2021-05-28 NOTE — Progress Notes (Signed)
Established Patient Office Visit  Subjective:  Patient ID: ANIFER ZENTGRAF, female    DOB: [birth year]  Age: 76 y.o. MRN: [id number]  CC:  Chief Complaint  Patient presents with   Follow-up    HPI [name] presents for follow-up regarding community-acquired pneumonia.  [name] was seen here on the 28th with some nonspecific symptoms of weakness, cough, congestion, nausea without vomiting, headache.  No documented fever.  [name] also had some diarrhea the weekend prior to being seen.  Poor appetite.  COVID testing and influenza testing here negative.  Has some rales in her right base and [name] had O2 sats 93% room air.  Clinical suspicion for pneumonia.  Chest x-ray confirmed right upper lobe pneumonia.  White count 15.7 thousand with 81% neutrophils.  Potassium 3.3.  [name] was keeping down fluids and appeared to be hydrated well and in no respiratory distress.  We elected for outpatient management with Levaquin 500 mg daily.  [name] is tolerating well.  [name] states [name] feels much better today.  Less weakness.  O2 sats improved to 97%.  No chills.  Nausea improved.  [name] has had some bilateral lower lumbar back pain.  No dysuria.  [name] feels like [name] may be wheezing some intermittently.  No history of asthma.  [name] has history of atrial fibrillation and is on Eliquis.  Past Medical History:  Diagnosis Date   Anticoagulant long-term use    eliquis--- managed by cardiology   Arthritis    knees   Hematuria    History of cystitis 2007   follicular cystitis s/p cysto bx   History of kidney stones    History of ovarian cancer in adulthood released from oncologist-- dr Darnelle Catalan per lov note in epic 07-11-2011 in remission   dx 01/ 2007;  02/ 2007 s/p exp. lap w/ TAH/ BSO/ Appendectomy/ Omectomy/ Splenectomy/ tumor debulking;  Stage IIIC,  completed chemo 07/ 2007;     History of septic shock 2016   secondary to UTI and blood infection from dog bite, resolved   History of thrombosis    per pt had 2007  PAC occlusion due to blood clot, completed coumadin treatment, and PAC removed   Lesion of bladder    Nonischemic cardiomyopathy    Echo 7/21: EF 40-45, global HK, normal RVSF, mild BAE, mild MR // Myoview 9/21: EF 52, no ischemia, low risk    Persistent atrial fibrillation [facility]) cardiologist-- dr Georgann Housekeeper   dx 08/ 2020 ;   S/p cardioversion 11/2018 //anticoagulation with Apixaban //Echo 11/2018: EF 45-50, LVH, GRII DD, mildly reduced RV SF, severe LAE   PONV (postoperative nausea and vomiting)    S/P splenectomy 05/03/2005   during surgery for extensive abdominal tumor debulking    Past Surgical History:  Procedure Laterality Date   BLEPHAROPLASTY Bilateral 07-04-2002  @[facility]    UPPER EYELIDS   CARDIOVERSION N/A 12/17/2018   Procedure: CARDIOVERSION;  Surgeon: Jodelle Red, MD;  Location: [facility] ENDOSCOPY;  Service: Cardiovascular;  Laterality: N/A;   CATARACT EXTRACTION W/ INTRAOCULAR LENS  IMPLANT, BILATERAL  2000 approx   CESAREAN SECTION  1982;  1976   CYSTOSCOPY WITH BIOPSY N/A 09/18/2020   Procedure: CYSTOSCOPY WITH BIOPSY FULGURATION;  Surgeon: Bjorn Pippin, MD;  Location: [facility];  Service: Urology;  Laterality: N/A;   EXPLORATORY LAPAROTOMY/  TOTAL ABDOMINAL HYSTERECTOMY/  BILATERAL SALPINGOOPHORECTOMY/  EXTENSIVE TUMOR DEBULKING/  APPENDECTOMY/  OMENTECTOMY/  SPLENECTOMY  05/03/2005   GASTRIC BYPASS  1979   intestinal bariatric  LAPAROSCOPIC CHOLECYSTECTOMY  10-19-2009 @ [facility]   PORT A CATH INSERTION AND REMOVAL  03/ 2007;  REMOVED 03/ 2008   TRANSURETHRAL RESECTION OF BLADDER  09-13-2005  @[facility]  dr Annabell Howells   LESION   VENTRAL HERNIA REPAIR  04-04-2005  @ [facility]    Family History  Problem Relation Age of Onset   Arthritis Mother        ?osteoarthritis   Cancer Father 27       lymphoma   Arthritis Other    Cancer Sister        ovaian cancer   Diabetes Sister        type 2     Social History   Socioeconomic History   Marital status: Married    Spouse  name: Not on file   Number of children: Not on file   Years of education: Not on file   Highest education level: Master's degree (e.g., MA, MS, MEng, MEd, MSW, MBA)  Occupational History   Not on file  Tobacco Use   Smoking status: Never   Smokeless tobacco: Never  Vaping Use   Vaping Use: Never used  Substance and Sexual Activity   Alcohol use: No   Drug use: No   Sexual activity: Yes    Partners: Male    Birth control/protection: Surgical    Comment: 1ST intercourse- 28, partners- 1, married- 45 yrs   Other Topics Concern   Not on file  Social History Narrative   Not on file   Social Determinants of Health   Financial Resource Strain: Low Risk    Difficulty of Paying Living Expenses: Not hard at all  Food Insecurity: No Food Insecurity   Worried About Programme researcher, broadcasting/film/video in the Last Year: Never true   Barista in the Last Year: Never true  Transportation Needs: No Transportation Needs   Lack of Transportation (Medical): No   Lack of Transportation (Non-Medical): No  Physical Activity: Sufficiently Active   Days of Exercise per Week: 5 days   Minutes of Exercise per Session: 30 min  Stress: No Stress Concern Present   Feeling of Stress : Not at all  Social Connections: Unknown   Frequency of Communication with Friends and Family: More than three times a week   Frequency of Social Gatherings with Friends and Family: More than three times a week   Attends Religious Services: Patient refused   Database administrator or Organizations: Yes   Attends Banker Meetings: 1 to 4 times per year   Marital Status: Married  Catering manager Violence: Not on file    Outpatient Medications Prior to Visit  Medication Sig Dispense Refill   ELIQUIS 5 MG TABS tablet TAKE 1 TABLET BY MOUTH TWICE A DAY 60 tablet 5   levofloxacin (LEVAQUIN) 500 MG tablet Take 1 tablet (500 mg total) by mouth daily for 7 days. 7 tablet 0   metoprolol succinate (TOPROL-XL) 100 MG 24 hr  tablet TAKE 1 TABLET (100 MG TOTAL) BY MOUTH DAILY. TO TAKE IN ADDITION WITH 50 MG METOPROLOL SUCCINATE TO EQUAL 150 MG 90 tablet 3   metoprolol tartrate (LOPRESSOR) 50 MG tablet TAKE 1 TABLET (50 MG TOTAL) BY MOUTH DAILY. TO TAKE IN ADDITION WITH 100 MG METOPROLOL SUCCINATE TO EQUAL 150 MG 90 tablet 3   Multiple Vitamin (MULTIVITAMIN) tablet Take 1 tablet by mouth daily.      zolpidem (AMBIEN) 10 MG tablet Take 1 tablet (10 mg total) by  mouth at bedtime as needed for sleep. 90 tablet 1   No facility-administered medications prior to visit.    Allergies  Allergen Reactions   Morphine And Related Nausea And Vomiting   Penicillins Hives    Did it involve swelling of the face/tongue/throat, SOB, or low BP? No Did it involve sudden or severe rash/hives, skin peeling, or any reaction on the inside of your mouth or nose? No Did you need to seek medical attention at a hospital or doctor's office? Unknown When did it last happen?      50 + years If all above answers are "NO", may proceed with cephalosporin use.     ROS Review of Systems  Constitutional:  Negative for chills and fever.  Respiratory:  Positive for cough.   Cardiovascular:  Negative for chest pain.  Gastrointestinal:  Negative for abdominal pain.  Genitourinary:  Negative for dysuria.     Objective:    Physical Exam Vitals reviewed.  Constitutional:      Appearance: Normal appearance.  Cardiovascular:     Rate and Rhythm: Normal rate.  Pulmonary:     Effort: Pulmonary effort is normal.     Comments: Still has slightly diminished aeration right base and middle lobe.  [name] has a few faint wheezes.  No increased work of breathing.  Pulse oximetry 97%-which is improved from 93% few days ago Neurological:     Mental Status: [name] is alert.    BP 110/68   Pulse 91   Temp 98 F (36.7 C) (Oral)   Ht 5\' 2"  (1.575 m)   Wt 161 lb 7 oz (73.2 kg)   SpO2 97%   BMI 29.53 kg/m  Wt Readings from Last 3 Encounters:  05/28/21  161 lb 7 oz (73.2 kg)  05/25/21 160 lb 6.4 oz (72.8 kg)  04/19/21 164 lb (74.4 kg)     Health Maintenance Due  Topic Date Due   COLONOSCOPY (Pts 45-28yrs Insurance coverage will need to be confirmed)  12/28/2017    There are no preventive care reminders to display for this patient.  Lab Results  Component Value Date   TSH 3.41 02/24/2021   Lab Results  Component Value Date   WBC 15.7 (H) 05/25/2021   HGB 13.2 05/25/2021   HCT 40.2 05/25/2021   MCV 98.5 05/25/2021   PLT 251.0 05/25/2021   Lab Results  Component Value Date   NA 135 05/25/2021   K 3.3 (L) 05/25/2021   CO2 22 05/25/2021   GLUCOSE 110 (H) 05/25/2021   BUN 17 05/25/2021   CREATININE 1.33 (H) 05/25/2021   BILITOT 0.7 02/24/2021   ALKPHOS 85 02/24/2021   AST 21 02/24/2021   ALT 16 02/24/2021   PROT 7.0 02/24/2021   ALBUMIN 4.0 02/24/2021   CALCIUM 8.6 05/25/2021   ANIONGAP 6 04/12/2014   GFR 39.58 (L) 05/25/2021   Lab Results  Component Value Date   CHOL 135 02/24/2021   Lab Results  Component Value Date   HDL 56.30 02/24/2021   Lab Results  Component Value Date   LDLCALC 61 02/24/2021   Lab Results  Component Value Date   TRIG 87.0 02/24/2021   Lab Results  Component Value Date   CHOLHDL 2 02/24/2021   No results found for: HGBA1C    Assessment & Plan:   Problem List Items Addressed This Visit   None Visit Diagnoses     Community acquired pneumonia of right upper lobe of lung    -  Primary   Relevant Medications   albuterol (VENTOLIN HFA) 108 (90 Base) MCG/ACT inhaler   Other Relevant Orders   DG Chest 2 View     Improving on Levaquin 500 mg daily.  [name] has had no further chills and feels much better overall with improving oxygen saturation.  We reviewed her recent labs.  [name] did have slightly low potassium 3.3.  -Consider over-the-counter Mucinex 1200 mg twice daily -Albuterol MDI 2 puffs every 4-6 hours as needed for wheezing -Finish out Levaquin -Stressed importance of  excellent hydration -Recommend repeat chest x-ray in 3 weeks to ensure resolution of infiltrate  Meds ordered this encounter  Medications   albuterol (VENTOLIN HFA) 108 (90 Base) MCG/ACT inhaler    Sig: Inhale 2 puffs into the lungs every 6 (six) hours as needed for wheezing or shortness of breath.    Dispense:  8 g    Refill:  0    Follow-up: No follow-ups on file.    Evelena Peat, MD

## 2021-05-31 ENCOUNTER — Encounter: Payer: Self-pay | Admitting: Family Medicine

## 2021-05-31 MED ORDER — METHOCARBAMOL 500 MG PO TABS: ORAL_TABLET | ORAL | 0 refills | Status: DC

## 2021-06-11 ENCOUNTER — Emergency Department (HOSPITAL_COMMUNITY)
Admission: EM | Admit: 2021-06-11 | Discharge: 2021-06-11 | Disposition: A | Discharge status: Home / Self Care | Payer: Medicare PPO | Attending: Emergency Medicine | Admitting: Emergency Medicine

## 2021-06-11 ENCOUNTER — Emergency Department (HOSPITAL_COMMUNITY): Payer: Medicare PPO

## 2021-06-11 ENCOUNTER — Ambulatory Visit: Payer: Medicare PPO | Admitting: Family Medicine

## 2021-06-11 ENCOUNTER — Other Ambulatory Visit: Payer: Self-pay

## 2021-06-11 ENCOUNTER — Encounter (HOSPITAL_COMMUNITY): Payer: Self-pay | Admitting: Radiology

## 2021-06-11 DIAGNOSIS — S34109A Unspecified injury to unspecified level of lumbar spinal cord, initial encounter: Secondary | ICD-10-CM | POA: Diagnosis present

## 2021-06-11 DIAGNOSIS — Z7901 Long term (current) use of anticoagulants: Secondary | ICD-10-CM | POA: Diagnosis not present

## 2021-06-11 DIAGNOSIS — S32010A Wedge compression fracture of first lumbar vertebra, initial encounter for closed fracture: Secondary | ICD-10-CM | POA: Insufficient documentation

## 2021-06-11 DIAGNOSIS — M2578 Osteophyte, vertebrae: Secondary | ICD-10-CM | POA: Diagnosis not present

## 2021-06-11 DIAGNOSIS — M549 Dorsalgia, unspecified: Secondary | ICD-10-CM | POA: Diagnosis not present

## 2021-06-11 DIAGNOSIS — J189 Pneumonia, unspecified organism: Secondary | ICD-10-CM | POA: Diagnosis not present

## 2021-06-11 DIAGNOSIS — R231 Pallor: Secondary | ICD-10-CM | POA: Diagnosis not present

## 2021-06-11 DIAGNOSIS — M545 Low back pain, unspecified: Secondary | ICD-10-CM

## 2021-06-11 DIAGNOSIS — R079 Chest pain, unspecified: Secondary | ICD-10-CM | POA: Diagnosis not present

## 2021-06-11 DIAGNOSIS — X58XXXA Exposure to other specified factors, initial encounter: Secondary | ICD-10-CM | POA: Insufficient documentation

## 2021-06-11 DIAGNOSIS — R110 Nausea: Secondary | ICD-10-CM | POA: Diagnosis not present

## 2021-06-11 DIAGNOSIS — R059 Cough, unspecified: Secondary | ICD-10-CM | POA: Diagnosis not present

## 2021-06-11 DIAGNOSIS — M5459 Other low back pain: Secondary | ICD-10-CM | POA: Diagnosis not present

## 2021-06-11 DIAGNOSIS — R000 Tachycardia, unspecified: Secondary | ICD-10-CM | POA: Diagnosis not present

## 2021-06-11 LAB — CBC WITH DIFFERENTIAL/PLATELET
Abs Immature Granulocytes: 0.04 10*3/uL (ref 0.00–0.07)
Basophils Absolute: 0 10*3/uL (ref 0.0–0.1)
Basophils Relative: 0 %
Eosinophils Absolute: 0.1 10*3/uL (ref 0.0–0.5)
Eosinophils Relative: 1 %
HCT: 37.8 % (ref 36.0–46.0)
Hemoglobin: 12.3 g/dL (ref 12.0–15.0)
Immature Granulocytes: 0 %
Lymphocytes Relative: 16 %
Lymphs Abs: 2 10*3/uL (ref 0.7–4.0)
MCH: 32.5 pg (ref 26.0–34.0)
MCHC: 32.5 g/dL (ref 30.0–36.0)
MCV: 99.7 fL (ref 80.0–100.0)
Monocytes Absolute: 1.5 10*3/uL — ABNORMAL HIGH (ref 0.1–1.0)
Monocytes Relative: 11 %
Neutro Abs: 9.2 10*3/uL — ABNORMAL HIGH (ref 1.7–7.7)
Neutrophils Relative %: 72 %
Platelets: 411 10*3/uL — ABNORMAL HIGH (ref 150–400)
RBC: 3.79 MIL/uL — ABNORMAL LOW (ref 3.87–5.11)
RDW: 14.2 % (ref 11.5–15.5)
WBC: 12.7 10*3/uL — ABNORMAL HIGH (ref 4.0–10.5)
nRBC: 0 % (ref 0.0–0.2)

## 2021-06-11 LAB — URINALYSIS, ROUTINE W REFLEX MICROSCOPIC
Bilirubin Urine: NEGATIVE
Glucose, UA: NEGATIVE mg/dL
Hgb urine dipstick: NEGATIVE
Ketones, ur: 5 mg/dL — AB
Leukocytes,Ua: NEGATIVE
Nitrite: NEGATIVE
Protein, ur: NEGATIVE mg/dL
Specific Gravity, Urine: 1.01 (ref 1.005–1.030)
pH: 6 (ref 5.0–8.0)

## 2021-06-11 LAB — BASIC METABOLIC PANEL
Anion gap: 9 (ref 5–15)
BUN: 15 mg/dL (ref 8–23)
CO2: 20 mmol/L — ABNORMAL LOW (ref 22–32)
Calcium: 8.3 mg/dL — ABNORMAL LOW (ref 8.9–10.3)
Chloride: 105 mmol/L (ref 98–111)
Creatinine, Ser: 0.91 mg/dL (ref 0.44–1.00)
GFR, Estimated: >60 mL/min (ref >60)
Glucose, Bld: 104 mg/dL — ABNORMAL HIGH (ref 70–99)
Potassium: 3.3 mmol/L — ABNORMAL LOW (ref 3.5–5.1)
Sodium: 134 mmol/L — ABNORMAL LOW (ref 135–145)

## 2021-06-11 MED ORDER — FENTANYL CITRATE PF 50 MCG/ML IJ SOSY
50.0000 ug | PREFILLED_SYRINGE | Freq: Once | INTRAMUSCULAR | Status: AC
  Administered 2021-06-11: 50 ug via INTRAVENOUS
  Filled 2021-06-11: qty 1

## 2021-06-11 MED ORDER — LIDOCAINE 5 % EX PTCH: 1.0000 | MEDICATED_PATCH | CUTANEOUS | 0 refills | Status: DC

## 2021-06-11 MED ORDER — HYDROCODONE-ACETAMINOPHEN 5-325 MG PO TABS: 1.0000 | ORAL_TABLET | ORAL | 0 refills | Status: DC | PRN

## 2021-06-11 MED ORDER — OXYCODONE-ACETAMINOPHEN 5-325 MG PO TABS
1.0000 | ORAL_TABLET | Freq: Once | ORAL | Status: AC
  Administered 2021-06-11: 1 via ORAL
  Filled 2021-06-11 (×2): qty 1

## 2021-06-11 MED ORDER — LIDOCAINE 5 % EX PTCH
1.0000 | MEDICATED_PATCH | CUTANEOUS | Status: DC
  Administered 2021-06-11: 1 via TRANSDERMAL
  Filled 2021-06-11: qty 1

## 2021-06-11 NOTE — ED Notes (Signed)
Pt walked to restroom w/ assistance from husband ?

## 2021-06-11 NOTE — ED Provider Notes (Signed)
?[place] DEPT ?Provider Note ? ? ?CSN: [id number] ?Arrival date & time: 06/11/21  1058 ? ?  ? ?History ? ?No chief complaint on file. ? ? ?[name] is a 76 y.o. female. ? ?Patient is a 76 year old female who presents with back pain.  She says been going on about 3 weeks.  She said that it started in around the same time she was diagnosed with pneumonia.  She had had cough and congestion.  She thinks that the coughing triggered the back pain.  She was treated with Levaquin.  The pain is in her right lower back.  Its been persistently in that area.  It at one point had gone to the left lower back but now is just in the right lower back.  It is worse with any movements and certain positions as well as walking.  Sometimes she can find a comfortable position in a recliner where it eases off.  She initially had been using Tylenol with no improvement in symptoms.  She stopped taking the Tylenol and has not recently been taking anything.  Her PCP prescribed her Robaxin but she took 1 dose and had nausea and diarrhea and did not like the way it made her feel.  She stopped taking that.  She denies any radiation of the pain.  No associated abdominal pain.  No radiation down her legs.  No numbness or weakness to her legs.  No urinary symptoms.  No loss of bowel or bladder control.  No fevers.  She is on Eliquis.  No recent falls.  She was given 100 mcg of fentanyl prior to arrival by EMS per their report.  She does have some significant improvement in symptoms after that.  She says that her pneumonia symptoms have significantly improved and she has very little cough.  She was supposed to go today for a follow-up appointment and repeat chest x-ray but was not able to ambulate due to her back pain and called EMS. ? ? ?  ? ?Home Medications ?Prior to Admission medications   ?Medication Sig Start Date End Date Taking? Authorizing Provider  ?acetaminophen (TYLENOL) 500 MG tablet Take 1,000 mg  by mouth every 4 (four) hours as needed for moderate pain or mild pain.   Yes [provider]  ?albuterol (VENTOLIN HFA) 108 (90 Base) MCG/ACT inhaler Inhale 2 puffs into the lungs every 6 (six) hours as needed for wheezing or shortness of breath. 05/28/21  Yes Burchette, Alinda Sierras, MD  ?ELIQUIS 5 MG TABS tablet TAKE 1 TABLET BY MOUTH TWICE A DAY ?Patient taking differently: Take 5 mg by mouth 2 (two) times daily. 05/28/21  Yes Burchette, Alinda Sierras, MD  ?HYDROcodone-acetaminophen (NORCO/VICODIN) 5-325 MG tablet Take 1 tablet by mouth every 4 (four) hours as needed. 06/11/21  Yes Malvin Johns, MD  ?lidocaine (LIDODERM) 5 % Place 1 patch onto the skin daily. Remove & Discard patch within 12 hours or as directed by MD 06/11/21  Yes Malvin Johns, MD  ?metoprolol succinate (TOPROL-XL) 100 MG 24 hr tablet TAKE 1 TABLET (100 MG TOTAL) BY MOUTH DAILY. TO TAKE IN ADDITION WITH 50 MG METOPROLOL SUCCINATE TO EQUAL 150 MG ?Patient taking differently: Take 100 mg by mouth daily. Takes in addition to Metoprolol '50mg'$  01/22/21  Yes [name], Wonda Cheng, MD  ?metoprolol tartrate (LOPRESSOR) 50 MG tablet TAKE 1 TABLET (50 MG TOTAL) BY MOUTH DAILY. TO TAKE IN ADDITION WITH 100 MG METOPROLOL SUCCINATE TO EQUAL 150 MG ?Patient taking differently:  Take 50 mg by mouth daily. Takes in addition to Metoprolol ER '100mg'$  11/11/20  Yes [name], Wonda Cheng, MD  ?Multiple Vitamin (MULTIVITAMIN) tablet Take 1 tablet by mouth daily.    Yes [provider]  ?Ondansetron HCl (ZOFRAN PO) Take 1 tablet by mouth daily as needed (nausea/vomiting).   Yes [provider]  ?Polyethyl Glycol-Propyl Glycol (SYSTANE OP) Apply 1 drop to eye daily as needed (tired eyes).   Yes [provider]  ?zolpidem (AMBIEN) 10 MG tablet Take 1 tablet (10 mg total) by mouth at bedtime as needed for sleep. ?Patient taking differently: Take 5 mg by mouth at bedtime as needed for sleep. 02/24/21 07/31/21 Yes Burchette, Alinda Sierras, MD  ?methocarbamol (ROBAXIN)  500 MG tablet Take 1 tablet every 8 hours as needed for muscle spasm ?Patient not taking: Reported on 06/11/2021 05/31/21   [name], MD  ?   ? ?Allergies    ?Morphine and related, Penicillins, and Methocarbamol   ? ?Review of Systems   ?Review of Systems  ?Constitutional:  Negative for chills, diaphoresis, fatigue and fever.  ?HENT:  Negative for congestion, rhinorrhea and sneezing.   ?Eyes: Negative.   ?Respiratory:  Positive for cough. Negative for chest tightness and shortness of breath.   ?Cardiovascular:  Negative for chest pain and leg swelling.  ?Gastrointestinal:  Negative for abdominal pain, blood in stool, diarrhea, nausea and vomiting.  ?Genitourinary:  Negative for difficulty urinating, flank pain, frequency and hematuria.  ?Musculoskeletal:  Positive for back pain. Negative for arthralgias.  ?Skin:  Negative for rash.  ?Neurological:  Negative for dizziness, speech difficulty, weakness, numbness and headaches.  ? ?Physical Exam ?Updated Vital Signs ?BP 124/60   Pulse 90   Temp 98.3 ?F (36.8 ?C) (Oral)   Resp 18   SpO2 100%  ?Physical Exam ?Constitutional:   ?   Appearance: She is well-developed.  ?HENT:  ?   Head: Normocephalic and atraumatic.  ?Eyes:  ?   Pupils: Pupils are equal, round, and reactive to light.  ?Cardiovascular:  ?   Rate and Rhythm: Normal rate and regular rhythm.  ?   Heart sounds: Normal heart sounds.  ?Pulmonary:  ?   Effort: Pulmonary effort is normal. No respiratory distress.  ?   Breath sounds: Normal breath sounds. No wheezing or rales.  ?Chest:  ?   Chest wall: No tenderness.  ?Abdominal:  ?   General: Bowel sounds are normal.  ?   Palpations: Abdomen is soft.  ?   Tenderness: There is no abdominal tenderness. There is no guarding or rebound.  ?Musculoskeletal:     ?   General: Normal range of motion.  ?   Cervical back: Normal range of motion and neck supple.  ?   Comments: Positive tenderness in the right lower back in the musculature.  It is definitely worse with  palpation of the area.  There is no midline tenderness.  Negative straight leg raise bilaterally.  Pedal pulses are intact in the lower extremities.  She has normal sensation and motor function to lower extremities.  ?Lymphadenopathy:  ?   Cervical: No cervical adenopathy.  ?Skin: ?   General: Skin is warm and dry.  ?   Findings: No rash.  ?Neurological:  ?   Mental Status: She is alert and oriented to person, place, and time.  ? ? ?ED Results / Procedures / Treatments   ?Labs ?(all labs ordered are listed, but only abnormal results are displayed) ?Labs Reviewed  ?  CBC WITH DIFFERENTIAL/PLATELET - Abnormal; Notable for the following components:  ?    Result Value  ? WBC 12.7 (*)   ? RBC 3.79 (*)   ? Platelets 411 (*)   ? Neutro Abs 9.2 (*)   ? Monocytes Absolute 1.5 (*)   ? All other components within normal limits  ?BASIC METABOLIC PANEL - Abnormal; Notable for the following components:  ? Sodium 134 (*)   ? Potassium 3.3 (*)   ? CO2 20 (*)   ? Glucose, Bld 104 (*)   ? Calcium 8.3 (*)   ? All other components within normal limits  ?URINALYSIS, ROUTINE W REFLEX MICROSCOPIC - Abnormal; Notable for the following components:  ? Ketones, ur 5 (*)   ? All other components within normal limits  ? ? ?EKG ?None ? ?Radiology ?DG Chest 1 View ? ?Result Date: 06/11/2021 ?CLINICAL DATA:  Cough.  Back pain beginning 1 month ago. EXAM: CHEST  1 VIEW COMPARISON:  05/25/2021 FINDINGS: Right upper lobe pneumonia has resolved.  Lungs are now clear. No pleural effusion or pneumothorax. Cardiac silhouette is normal in size. No mediastinal or hilar masses. Skeletal structures are grossly intact. IMPRESSION: 1. No active disease of the chest. Resolved right upper lobe pneumonia. Electronically Signed   By: Lajean Manes M.D.   On: 06/11/2021 12:12  ? ?CT Lumbar Spine Wo Contrast ? ?Result Date: 06/11/2021 ?CLINICAL DATA:  Low back pain for 1 month.  Increased fracture risk. EXAM: CT LUMBAR SPINE WITHOUT CONTRAST TECHNIQUE: Multidetector CT  imaging of the lumbar spine was performed without intravenous contrast administration. Multiplanar CT image reconstructions were also generated. RADIATION DOSE REDUCTION: This exam was performed according

## 2021-06-11 NOTE — ED Triage Notes (Addendum)
Pt arrived via EMS, from home, started with back pain x1 month ago, was hospitalized for approx 3wks for PNA, pain progressively worsening since. Atraumatic  ? ? ?131mg fentanyl PTA ?20 L AC ?

## 2021-06-15 ENCOUNTER — Encounter: Payer: Self-pay | Admitting: Family Medicine

## 2021-06-15 ENCOUNTER — Ambulatory Visit: Payer: Medicare PPO | Admitting: Family Medicine

## 2021-06-15 ENCOUNTER — Telehealth: Payer: Self-pay | Admitting: Family Medicine

## 2021-06-15 VITALS — BP 100/60 | HR 73 | Temp 97.5°F | Ht 62.0 in | Wt 161.1 lb

## 2021-06-15 DIAGNOSIS — J189 Pneumonia, unspecified organism: Secondary | ICD-10-CM

## 2021-06-15 DIAGNOSIS — S32010D Wedge compression fracture of first lumbar vertebra, subsequent encounter for fracture with routine healing: Secondary | ICD-10-CM

## 2021-06-15 DIAGNOSIS — S32010A Wedge compression fracture of first lumbar vertebra, initial encounter for closed fracture: Secondary | ICD-10-CM

## 2021-06-15 MED ORDER — HYDROCODONE-ACETAMINOPHEN 5-325 MG PO TABS: 1.0000 | ORAL_TABLET | ORAL | 0 refills | Status: DC | PRN

## 2021-06-15 MED ORDER — CALCITONIN (SALMON) 200 UNIT/ACT NA SOLN: 1.0000 | Freq: Every day | NASAL | 1 refills | Status: DC

## 2021-06-15 NOTE — Patient Instructions (Signed)
You do need to consider osteoporosis therapies with Gyn.   ?

## 2021-06-15 NOTE — Telephone Encounter (Signed)
Please advise 

## 2021-06-15 NOTE — Progress Notes (Signed)
Established Patient Office Visit  Subjective:  Patient ID: [name], female    DOB: Apr 29, 1945  Age: 76 y.o. MRN: [id number]  CC:  Chief Complaint  Patient presents with   Hospitalization Follow-up    HPI [name] presents for ER follow-up.  She was seen here February 28 with weakness and diagnosed with right upper lobe pneumonia.  She was placed on broad-spectrum antibiotics with Levaquin.  She did seem to improve fairly quickly clinically with regard to the pneumonia but had a significant amount of coughing.  She was seen in follow-up March 3 much improved clinically overall but had some bilateral lower back pain.  No dysuria.  No spinal tenderness.  No localizing pain.  We thought at that point her back pain may be related to excessive coughing.  At that point her pain was not severe.  However, she had progressive worsening of pain which she described as 10 out of 10 and went to the ER on the 17th.  She had CT scan which showed acute compression fracture L1.  She relates DEXA scan 2019 with what she describes as "borderline osteoporosis "through her GYN.  She is actually scheduled to see her GYN in April for repeat DEXA scan.  She was prescribed Vicodin 5/325 mg and initially taking 1 every 4-6 hours which did help her pain considerably.  She is trying to space this out a little bit more at this time.  Her pain has improved some past couple of days.  Still has fairly intense pain at times.  No fever.  She had 1 view chest x-ray in the ER which did show resolution of her previously noted right upper lobe pneumonia.  Cough essentially resolved.  Past Medical History:  Diagnosis Date   Anticoagulant long-term use    eliquis--- managed by cardiology   Arthritis    knees   Hematuria    History of cystitis 2007   follicular cystitis s/p cysto bx   History of kidney stones    History of ovarian cancer in adulthood released from oncologist-- dr Darnelle Catalan per lov note in  epic 07-11-2011 in remission   dx 01/ 2007;  02/ 2007 s/p exp. lap w/ TAH/ BSO/ Appendectomy/ Omectomy/ Splenectomy/ tumor debulking;  Stage IIIC,  completed chemo 07/ 2007;     History of septic shock 2016   secondary to UTI and blood infection from dog bite, resolved   History of thrombosis    per pt had 2007 PAC occlusion due to blood clot, completed coumadin treatment, and PAC removed   Lesion of bladder    Nonischemic cardiomyopathy    Echo 7/21: EF 40-45, global HK, normal RVSF, mild BAE, mild MR // Myoview 9/21: EF 52, no ischemia, low risk    Persistent atrial fibrillation [facility]) cardiologist-- dr Georgann Housekeeper   dx 08/ 2020 ;   S/p cardioversion 11/2018 //anticoagulation with Apixaban //Echo 11/2018: EF 45-50, LVH, GRII DD, mildly reduced RV SF, severe LAE   PONV (postoperative nausea and vomiting)    S/P splenectomy 05/03/2005   during surgery for extensive abdominal tumor debulking    Past Surgical History:  Procedure Laterality Date   BLEPHAROPLASTY Bilateral 07-04-2002  @[facility]    UPPER EYELIDS   CARDIOVERSION N/A 12/17/2018   Procedure: CARDIOVERSION;  Surgeon: Jodelle Red, MD;  Location: [facility] ENDOSCOPY;  Service: Cardiovascular;  Laterality: N/A;   CATARACT EXTRACTION W/ INTRAOCULAR LENS  IMPLANT, BILATERAL  2000 approx   CESAREAN SECTION  1982;  1976  CYSTOSCOPY WITH BIOPSY N/A 09/18/2020   Procedure: CYSTOSCOPY WITH BIOPSY FULGURATION;  Surgeon: Bjorn Pippin, MD;  Location: [facility];  Service: Urology;  Laterality: N/A;   EXPLORATORY LAPAROTOMY/  TOTAL ABDOMINAL HYSTERECTOMY/  BILATERAL SALPINGOOPHORECTOMY/  EXTENSIVE TUMOR DEBULKING/  APPENDECTOMY/  OMENTECTOMY/  SPLENECTOMY  05/03/2005   GASTRIC BYPASS  1979   intestinal bariatric    LAPAROSCOPIC CHOLECYSTECTOMY  10-19-2009 @ [facility]   PORT A CATH INSERTION AND REMOVAL  03/ 2007;  REMOVED 03/ 2008   TRANSURETHRAL RESECTION OF BLADDER  09-13-2005  @[facility]  dr Annabell Howells   LESION   VENTRAL HERNIA REPAIR  04-04-2005  @  [facility]    Family History  Problem Relation Age of Onset   Arthritis Mother        ?osteoarthritis   Cancer Father 55       lymphoma   Arthritis Other    Cancer Sister        ovaian cancer   Diabetes Sister        type 2     Social History   Socioeconomic History   Marital status: Married    Spouse name: Not on file   Number of children: Not on file   Years of education: Not on file   Highest education level: Master's degree (e.g., MA, MS, MEng, MEd, MSW, MBA)  Occupational History   Not on file  Tobacco Use   Smoking status: Never   Smokeless tobacco: Never  Vaping Use   Vaping Use: Never used  Substance and Sexual Activity   Alcohol use: No   Drug use: No   Sexual activity: Yes    Partners: Male    Birth control/protection: Surgical    Comment: 1ST intercourse- 28, partners- 1, married- 45 yrs   Other Topics Concern   Not on file  Social History Narrative   Not on file   Social Determinants of Health   Financial Resource Strain: Low Risk    Difficulty of Paying Living Expenses: Not hard at all  Food Insecurity: No Food Insecurity   Worried About Programme researcher, broadcasting/film/video in the Last Year: Never true   Barista in the Last Year: Never true  Transportation Needs: No Transportation Needs   Lack of Transportation (Medical): No   Lack of Transportation (Non-Medical): No  Physical Activity: Sufficiently Active   Days of Exercise per Week: 5 days   Minutes of Exercise per Session: 30 min  Stress: No Stress Concern Present   Feeling of Stress : Not at all  Social Connections: Unknown   Frequency of Communication with Friends and Family: More than three times a week   Frequency of Social Gatherings with Friends and Family: More than three times a week   Attends Religious Services: Patient refused   Database administrator or Organizations: Yes   Attends Banker Meetings: 1 to 4 times per year   Marital Status: Married  Catering manager Violence: Not  on file    Outpatient Medications Prior to Visit  Medication Sig Dispense Refill   acetaminophen (TYLENOL) 500 MG tablet Take 1,000 mg by mouth every 4 (four) hours as needed for moderate pain or mild pain.     albuterol (VENTOLIN HFA) 108 (90 Base) MCG/ACT inhaler Inhale 2 puffs into the lungs every 6 (six) hours as needed for wheezing or shortness of breath. 8 g 0   ELIQUIS 5 MG TABS tablet TAKE 1 TABLET BY MOUTH TWICE A DAY (  Patient taking differently: Take 5 mg by mouth 2 (two) times daily.) 60 tablet 5   lidocaine (LIDODERM) 5 % Place 1 patch onto the skin daily. Remove & Discard patch within 12 hours or as directed by MD 30 patch 0   metoprolol succinate (TOPROL-XL) 100 MG 24 hr tablet TAKE 1 TABLET (100 MG TOTAL) BY MOUTH DAILY. TO TAKE IN ADDITION WITH 50 MG METOPROLOL SUCCINATE TO EQUAL 150 MG (Patient taking differently: Take 100 mg by mouth daily. Takes in addition to Metoprolol 50mg ) 90 tablet 3   metoprolol tartrate (LOPRESSOR) 50 MG tablet TAKE 1 TABLET (50 MG TOTAL) BY MOUTH DAILY. TO TAKE IN ADDITION WITH 100 MG METOPROLOL SUCCINATE TO EQUAL 150 MG (Patient taking differently: Take 50 mg by mouth daily. Takes in addition to Metoprolol ER 100mg ) 90 tablet 3   Multiple Vitamin (MULTIVITAMIN) tablet Take 1 tablet by mouth daily.      Ondansetron HCl (ZOFRAN PO) Take 1 tablet by mouth daily as needed (nausea/vomiting).     Polyethyl Glycol-Propyl Glycol (SYSTANE OP) Apply 1 drop to eye daily as needed (tired eyes).     zolpidem (AMBIEN) 10 MG tablet Take 1 tablet (10 mg total) by mouth at bedtime as needed for sleep. (Patient taking differently: Take 5 mg by mouth at bedtime as needed for sleep.) 90 tablet 1   HYDROcodone-acetaminophen (NORCO/VICODIN) 5-325 MG tablet Take 1 tablet by mouth every 4 (four) hours as needed. 15 tablet 0   methocarbamol (ROBAXIN) 500 MG tablet Take 1 tablet every 8 hours as needed for muscle spasm (Patient not taking: Reported on 06/11/2021) 20 tablet 0   No  facility-administered medications prior to visit.    Allergies  Allergen Reactions   Morphine And Related Nausea And Vomiting   Penicillins Hives    Did it involve swelling of the face/tongue/throat, SOB, or low BP? No Did it involve sudden or severe rash/hives, skin peeling, or any reaction on the inside of your mouth or nose? No Did you need to seek medical attention at a hospital or doctor's office? Unknown When did it last happen?      50 + years If all above answers are "NO", may proceed with cephalosporin use.    Methocarbamol Other (See Comments)    Stomach upset    ROS Review of Systems  Constitutional:  Negative for chills and fever.  Respiratory:  Negative for shortness of breath.   Cardiovascular:  Negative for chest pain.  Gastrointestinal:  Negative for abdominal pain, nausea and vomiting.  Genitourinary:  Negative for dysuria.  Musculoskeletal:  Positive for back pain.     Objective:    Physical Exam Vitals reviewed.  Cardiovascular:     Rate and Rhythm: Normal rate and regular rhythm.  Pulmonary:     Effort: Pulmonary effort is normal.     Breath sounds: Normal breath sounds. No rales.  Musculoskeletal:     Comments: Surprisingly, does not have any tenderness over her upper lumbar spine region.  Neurological:     Mental Status: She is alert.    BP 100/60 (BP Location: Left Arm, Patient Position: Sitting, Cuff Size: Normal)   Pulse 73   Temp (!) 97.5 F (36.4 C) (Oral)   Ht 5\' 2"  (1.575 m)   Wt 161 lb 1.6 oz (73.1 kg)   SpO2 97%   BMI 29.47 kg/m  Wt Readings from Last 3 Encounters:  06/15/21 161 lb 1.6 oz (73.1 kg)  06/11/21 161 lb 7 oz (73.2  kg)  05/28/21 161 lb 7 oz (73.2 kg)     Health Maintenance Due  Topic Date Due   COLONOSCOPY (Pts 45-26yrs Insurance coverage will need to be confirmed)  12/28/2017    There are no preventive care reminders to display for this patient.  Lab Results  Component Value Date   TSH 3.41 02/24/2021    Lab Results  Component Value Date   WBC 12.7 (H) 06/11/2021   HGB 12.3 06/11/2021   HCT 37.8 06/11/2021   MCV 99.7 06/11/2021   PLT 411 (H) 06/11/2021   Lab Results  Component Value Date   NA 134 (L) 06/11/2021   K 3.3 (L) 06/11/2021   CO2 20 (L) 06/11/2021   GLUCOSE 104 (H) 06/11/2021   BUN 15 06/11/2021   CREATININE 0.91 06/11/2021   BILITOT 0.7 02/24/2021   ALKPHOS 85 02/24/2021   AST 21 02/24/2021   ALT 16 02/24/2021   PROT 7.0 02/24/2021   ALBUMIN 4.0 02/24/2021   CALCIUM 8.3 (L) 06/11/2021   ANIONGAP 9 06/11/2021   GFR 39.58 (L) 05/25/2021   Lab Results  Component Value Date   CHOL 135 02/24/2021   Lab Results  Component Value Date   HDL 56.30 02/24/2021   Lab Results  Component Value Date   LDLCALC 61 02/24/2021   Lab Results  Component Value Date   TRIG 87.0 02/24/2021   Lab Results  Component Value Date   CHOLHDL 2 02/24/2021   No results found for: HGBA1C    Assessment & Plan:   #1 recent community-acquired pneumonia right upper lobe.  Clinically improved with follow-up chest x-ray showing resolution.  #2 acute compression fracture L1 probably related to recent coughing.  Patient still has moderate pain.  -Refilled Vicodin 5/325 mg 1 every 4-6 hours as needed for severe pain.  She will try to gradually spread this out some. -We did discuss possible use of Miacalcin nasal 200 mcg 1 spray once daily alternating nostrils for 2 to 4 weeks depending on whether she gets any improvement.  She is aware we are not using this in any way for bone building but more for acute pain management. -We did discuss the fact that she will need to discuss with her GYN potential osteoporosis therapies.  She is reportedly getting follow-up DEXA scan in April.   Meds ordered this encounter  Medications   HYDROcodone-acetaminophen (NORCO/VICODIN) 5-325 MG tablet    Sig: Take 1 tablet by mouth every 4 (four) hours as needed.    Dispense:  30 tablet    Refill:  0    calcitonin, salmon, (MIACALCIN) 200 UNIT/ACT nasal spray    Sig: Place 1 spray into alternate nostrils daily.    Dispense:  3.7 mL    Refill:  1    Follow-up: No follow-ups on file.    Evelena Peat, MD

## 2021-06-15 NOTE — Telephone Encounter (Signed)
Patient called in stating that pharmacy informed her that they do not have HYDROcodone-acetaminophen (NORCO/VICODIN) 5-325 MG tablet [[id number]] in stock. The pharmacy told her that they do have the 10-'325mg'$  and she could possibly get that and split in half. Patient is just wanting to know what could she do because she is out and needs the medication. ? ?Please advise. ?

## 2021-06-16 MED ORDER — HYDROCODONE-ACETAMINOPHEN 10-325 MG PO TABS: 1.0000 | ORAL_TABLET | Freq: Four times a day (QID) | ORAL | 0 refills | Status: AC | PRN

## 2021-06-16 NOTE — Telephone Encounter (Signed)
Called patient, aware prescription has been sent to [facility].  ?

## 2021-06-16 NOTE — Telephone Encounter (Signed)
New hydrocodone rx sent. ?

## 2021-06-16 NOTE — Telephone Encounter (Signed)
New prescription has been sent.

## 2021-06-19 ENCOUNTER — Other Ambulatory Visit: Payer: Self-pay | Admitting: Family Medicine

## 2021-06-29 ENCOUNTER — Ambulatory Visit (INDEPENDENT_AMBULATORY_CARE_PROVIDER_SITE_OTHER): Payer: Medicare PPO

## 2021-06-29 ENCOUNTER — Other Ambulatory Visit: Payer: Self-pay | Admitting: Obstetrics & Gynecology

## 2021-06-29 DIAGNOSIS — Z780 Asymptomatic menopausal state: Secondary | ICD-10-CM

## 2021-06-29 DIAGNOSIS — M810 Age-related osteoporosis without current pathological fracture: Secondary | ICD-10-CM

## 2021-06-29 LAB — HM DEXA SCAN

## 2021-07-02 ENCOUNTER — Encounter: Payer: Self-pay | Admitting: Family Medicine

## 2021-07-05 ENCOUNTER — Encounter: Payer: Self-pay | Admitting: Obstetrics & Gynecology

## 2021-07-07 ENCOUNTER — Encounter: Payer: Self-pay | Admitting: Family Medicine

## 2021-07-08 ENCOUNTER — Other Ambulatory Visit: Payer: Self-pay | Admitting: Family Medicine

## 2021-07-09 ENCOUNTER — Ambulatory Visit: Payer: Medicare PPO | Admitting: Obstetrics & Gynecology

## 2021-07-09 ENCOUNTER — Encounter: Payer: Self-pay | Admitting: Obstetrics & Gynecology

## 2021-07-09 VITALS — BP 120/80

## 2021-07-09 DIAGNOSIS — M8000XD Age-related osteoporosis with current pathological fracture, unspecified site, subsequent encounter for fracture with routine healing: Secondary | ICD-10-CM | POA: Diagnosis not present

## 2021-07-09 MED ORDER — ALENDRONATE SODIUM 70 MG PO TABS: 70.0000 mg | ORAL_TABLET | ORAL | 4 refills | Status: DC

## 2021-07-09 NOTE — Progress Notes (Signed)
? ? ?  [name] 01-Sep-1945 [id number] ? ? ?     76 y.o.  [id number]  ? ?RP: Osteoporosis on Bone Density on 06/29/2021 ? ?HPI: Osteoporosis on Bone Density on 06/29/2021.  Recent fracture at L1-2 probably d/t severe coughing.  Taking a multivitamin.  Physically active, but limited by Arthritis.   ? ? ?OB History  ?Gravida Para Term Preterm AB Living  ?'3 2 2   1 2  '$ ?SAB IAB Ectopic Multiple Live Births  ?1       2  ?  ?# Outcome Date GA Lbr Len/2nd Weight Sex Delivery Anes PTL Lv  ?3 Term 1982 [redacted]w[redacted]d 6 lb 11 oz (3.033 kg) M CS-LTranv Spinal  LIV  ?2 Term 1976 [id number] lb 9 oz (3.43 kg) F CS-LTranv Spinal  LIV  ?1 SAB         DEC  ? ? ?Past medical history,surgical history, problem list, medications, allergies, family history and social history were all reviewed and documented in the EPIC chart. ? ? ?Directed ROS with pertinent positives and negatives documented in the history of present illness/assessment and plan. ? ?Exam: ? ?Vitals:  ? 07/09/21 1329  ?BP: 120/80  ? ?General appearance:  Normal ? ?Bone Density 06/29/2021:  Osteoporosis at the Left Total Hip/Left Femoral Neck with T-Score at -2.5.  BD Normal at the AP Spine, but patient was just Dx with a fracture at L1-2.  Osteopenia at other sites.  Significant decrease at the Total Left Hip c/w 2019.   ? ? ?Assessment/Plan:  [age].o. [id number]? ?1. Age-related osteoporosis with current pathological fracture with routine healing, subsequent encounter ?Bone Density 06/29/2021:  Osteoporosis at the Left Total Hip/Left Femoral Neck with T-Score at -2.5.  BD Normal at the AP Spine, but patient was just Dx with a fracture at L1-2 probably d/t severe coughing.  Osteopenia at other sites.  Significant decrease at the Total Left Hip c/w 2019.  Risks associated with Osteoporosis/Fracture thoroughly reviewed.  Decision to start on Bone Therapy.  Treatment with Bisphosphonate, especially Alendronate (Fosamax) and Prolia discussed.  Decision to start on Alendronate.  Risks/Benefits  reviewed in particular the risk of GERD and Necrosis of the Jaw.  Will interrupt Alendronate treatment if has major dental work.  Usage reviewed and prescription sent to pharmacy. ? ?Other orders ?- alendronate (FOSAMAX) 70 MG tablet; Take 1 tablet (70 mg total) by mouth every 7 (seven) days. Take with a full glass of water on an empty stomach.  ? ?Counseling on osteoporosis and bone fracture, documentation reviewed, lifestyle modifications and management with bone medication. ? ?MaPrincess BruinsD, 1:50 PM 07/09/2021 ? ? ? ?  ?

## 2021-07-10 LAB — VITAMIN D 25 HYDROXY (VIT D DEFICIENCY, FRACTURES): Vit D, 25-Hydroxy: 32 ng/mL (ref 30–100)

## 2021-08-31 ENCOUNTER — Other Ambulatory Visit: Payer: Self-pay | Admitting: Cardiovascular Disease

## 2021-09-07 ENCOUNTER — Other Ambulatory Visit: Payer: Self-pay | Admitting: Family Medicine

## 2021-11-30 ENCOUNTER — Other Ambulatory Visit: Payer: Self-pay | Admitting: Cardiovascular Disease

## 2021-12-10 ENCOUNTER — Other Ambulatory Visit: Payer: Self-pay | Admitting: Family Medicine

## 2021-12-13 NOTE — Telephone Encounter (Signed)
Last OV- 06/15/21 Last refill-09/08/21--90 tabs, 0 refills  No future OV scheduled.

## 2021-12-14 ENCOUNTER — Ambulatory Visit: Payer: Medicare PPO | Admitting: Physician Assistant

## 2021-12-14 NOTE — Progress Notes (Unsigned)
Cardiology Office Note:    Date:  12/15/2021   ID:  [name], DOB Mar 02, 1946, MRN [id number]  PCP:  [name], MD  [facility] Cardiologist:  Mertie Moores, MD     Referring MD: [name], MD   Chief Complaint:  F/u for AFib    Patient Profile: Permanent atrial fibrillation  S/p DCCV 11/2018 CHA2DS2-VASc=3 (female, age x 1, LV dysfunction)  Cardiomyopathy Echocardiogram 11/2018: EF 45-50, Gr 2 DD Echocardiogram 7/21: EF 40-45 Myoview 9/21: EF 52, no ischemia  Ovarian CA  Prior CV Studies: GATED SPECT MYO PERF W/LEXISCAN STRESS 1D 12/25/2019 No reversible ischemia. LVEF 52% with normal wall motion. This is a low risk study.    Echocardiogram 10/24/19 EF 40-45, global HK, normal RVSF, mild BAE, mild MR   Echocardiogram 12/19/2018 EF 45-50, mild post LVH, Gr 2 DD, mild reduced RVSF, severe LAE, mild MAC, trace MR, trivial TR  History of Present Illness:   [name] is a 76 y.o. female with the above problem list.  She was last seen by Dr. Acie Fredrickson in Dec 22. She returns for f/u. She is here alone. She had pneumonia earlier this year. She also had a compression fx. Her compression fx has healed and she is no longer having pain. She has not had chest pain, shortness of breath, syncope, orthopnea, edema.         Past Medical History:  Diagnosis Date   Anticoagulant long-term use    eliquis--- managed by cardiology   Arthritis    knees   Hematuria    History of cystitis 3976   follicular cystitis s/p cysto bx   History of kidney stones    History of ovarian cancer in adulthood released from oncologist-- dr Jana Hakim per lov note in epic 07-11-2011 in remission   dx 01/ 2007;  02/ 2007 s/p exp. lap w/ TAH/ BSO/ Appendectomy/ Omectomy/ Splenectomy/ tumor debulking;  Stage IIIC,  completed chemo 07/ 2007;     History of septic shock 2016   secondary to UTI and blood infection from dog bite, resolved   History of thrombosis    per  pt had 2007 PAC occlusion due to blood clot, completed coumadin treatment, and PAC removed   History of vertebral fracture    L1 & 2   Lesion of bladder    Nonischemic cardiomyopathy    Echo 7/21: EF 40-45, global HK, normal RVSF, mild BAE, mild MR // Myoview 9/21: EF 52, no ischemia, low risk    Persistent atrial fibrillation [facility]) cardiologist-- dr Jasper Riling   dx 08/ 2020 ;   S/p cardioversion 11/2018 //anticoagulation with Apixaban //Echo 11/2018: EF 45-50, LVH, GRII DD, mildly reduced RV SF, severe LAE   PONV (postoperative nausea and vomiting)    S/P splenectomy 05/03/2005   during surgery for extensive abdominal tumor debulking   Current Medications: Current Meds  Medication Sig   acetaminophen (TYLENOL) 500 MG tablet Take 1,000 mg by mouth every 4 (four) hours as needed for moderate pain or mild pain.   albuterol (VENTOLIN HFA) 108 (90 Base) MCG/ACT inhaler Inhale 2 puffs into the lungs every 6 (six) hours as needed for wheezing or shortness of breath.   metoprolol succinate (TOPROL-XL) 50 MG 24 hr tablet Take 1 tablet (50 mg total) by mouth daily. Take with or immediately following a meal.   Multiple Vitamin (MULTIVITAMIN) tablet Take 1 tablet by mouth daily.    Ondansetron HCl (ZOFRAN PO) Take 1  tablet by mouth daily as needed (nausea/vomiting).   Polyethyl Glycol-Propyl Glycol (SYSTANE OP) Apply 1 drop to eye daily as needed (tired eyes).   zolpidem (AMBIEN) 10 MG tablet TAKE 1 TABLET BY MOUTH EVERY DAY AT BEDTIME AS NEEDED FOR SLEEP   [DISCONTINUED] ELIQUIS 5 MG TABS tablet TAKE 1 TABLET BY MOUTH TWICE A DAY   [DISCONTINUED] metoprolol succinate (TOPROL-XL) 100 MG 24 hr tablet TAKE 1 TABLET (100 MG TOTAL) BY MOUTH DAILY. TO TAKE IN ADDITION WITH 50 MG METOPROLOL SUCCINATE TO EQUAL 150 MG   [DISCONTINUED] metoprolol tartrate (LOPRESSOR) 50 MG tablet TAKE 1 TABLET BY MOUTH DAILY. TO TAKE IN ADDITION WITH 100 MG METOPROLOL SUCCINATE TO EQUAL 150 MG    Allergies:   Morphine and related,  Penicillins, and Methocarbamol   Social History   Tobacco Use   Smoking status: Never   Smokeless tobacco: Never  Vaping Use   Vaping Use: Never used  Substance Use Topics   Alcohol use: No   Drug use: No    Family Hx: The patient's family history includes Arthritis in her mother and another family member; Cancer in her sister; Cancer (age of onset: 34) in her father; Diabetes in her sister.  Review of Systems  Gastrointestinal:  Negative for hematochezia and melena.  Genitourinary:  Negative for hematuria.     EKGs/Labs/Other Test Reviewed:    EKG:  EKG is   ordered today.  The ekg ordered today demonstrates atrial fibrillation, HR 81, normal axis, QTc 427, no change from prior tracing.   Recent Labs: 02/24/2021: ALT 16; TSH 3.41 06/11/2021: BUN 15; Creatinine, Ser 0.91; Hemoglobin 12.3; Platelets 411; Potassium 3.3; Sodium 134   Recent Lipid Panel Recent Labs    02/24/21 1015  CHOL 135  TRIG 87.0  HDL 56.30  VLDL 17.4  LDLCALC 61     Risk Assessment/Calculations/Metrics:    CHA2DS2-VASc Score = 3   This indicates a 3.2% annual risk of stroke. The patient's score is based upon: CHF History: 1 HTN History: 0 Diabetes History: 0 Stroke History: 0 Vascular Disease History: 0 Age Score: 1 Gender Score: 1             Physical Exam:    VS:  BP 113/62   Pulse 81   Ht _0  (1.575 m)   Wt 158 lb (71.7 kg)   SpO2 95%   BMI 28.90 kg/m     Wt Readings from Last 3 Encounters:  12/15/21 158 lb (71.7 kg)  06/15/21 161 lb 1.6 oz (73.1 kg)  06/11/21 161 lb 7 oz (73.2 kg)    Constitutional:      Appearance: Healthy appearance. Not in distress.  Neck:     Vascular: JVD normal.  Pulmonary:     Effort: Pulmonary effort is normal.     Breath sounds: No wheezing. No rales.  Cardiovascular:     Normal rate. Irregularly irregular rhythm. Normal S1. Normal S2.      Murmurs: There is no murmur.  Edema:    Peripheral edema absent.  Abdominal:     Palpations:  Abdomen is soft.  Skin:    General: Skin is warm and dry.  Neurological:     Mental Status: Alert and oriented to person, place and time.         ASSESSMENT & PLAN:   Permanent atrial fibrillation (HCC) Rate is controlled. She is tolerating anticoagulation. She has been in atrial fibrillation for several years now. She is asymptomatic. Her last  echocardiogram in 2021 showed mild BAE. Given contemporaneous data which supports rhythm control due to improved long term outcomes, I have recommended that we go ahead and repeat her echocardiogram now. If her atria are severely dilated, the likelihood of restoring NSR wold be very low. However, if her atria are just mildly dilated, it may be worthwhile to refer to EP to discuss a rhythm control strategy (drugs vs PVI ablation). I reviewed all of this with her. Continue Eliquis 5 mg twice daily, Toprol XL 150 mg once daily. F/u in 6 mos.   Nonischemic cardiomyopathy EF on echocardiogram in 2021 was 40-45. EF on Myoview in 2021 was 52. Continue Toprol XL 150 mg once daily. Obtain F/u echocardiogram. If EF worse, will need to max GDMT as BP will tolerate.              Dispo:  Return in about 6 months (around 06/15/2022) for Routine Follow Up, w/ Dr. Acie Fredrickson.   Medication Adjustments/Labs and Tests Ordered: Current medicines are reviewed at length with the patient today.  Concerns regarding medicines are outlined above.  Tests Ordered: Orders Placed This Encounter  Procedures   EKG 12-Lead   ECHOCARDIOGRAM COMPLETE   Medication Changes: Meds ordered this encounter  Medications   metoprolol succinate (TOPROL-XL) 50 MG 24 hr tablet    Sig: Take 1 tablet (50 mg total) by mouth daily. Take with or immediately following a meal.    Dispense:  90 tablet    Refill:  3   apixaban (ELIQUIS) 5 MG TABS tablet    Sig: Take 1 tablet (5 mg total) by mouth 2 (two) times daily.    Dispense:  180 tablet    Refill:  3   metoprolol succinate (TOPROL-XL) 100 MG  24 hr tablet    Sig: Take 1 tablet (100 mg total) by mouth daily. To take in addition with 50 mg Metoprolol Succinate to equal 150 mg    Dispense:  90 tablet    Refill:  0   Signed, Richardson Dopp, PA-C  12/15/2021 11:32 AM    [facility] [place], [place], [place]  [address] Phone: [phone]; Fax: [phone]

## 2021-12-15 ENCOUNTER — Encounter: Payer: Self-pay | Admitting: Physician Assistant

## 2021-12-15 ENCOUNTER — Ambulatory Visit: Payer: Medicare PPO | Attending: Physician Assistant | Admitting: Physician Assistant

## 2021-12-15 VITALS — BP 113/62 | HR 81 | Ht 62.0 in | Wt 158.0 lb

## 2021-12-15 DIAGNOSIS — I428 Other cardiomyopathies: Secondary | ICD-10-CM

## 2021-12-15 DIAGNOSIS — I4821 Permanent atrial fibrillation: Secondary | ICD-10-CM | POA: Diagnosis not present

## 2021-12-15 MED ORDER — METOPROLOL SUCCINATE ER 50 MG PO TB24: 50.0000 mg | ORAL_TABLET | Freq: Every day | ORAL | 3 refills | Status: DC

## 2021-12-15 MED ORDER — APIXABAN 5 MG PO TABS: 5.0000 mg | ORAL_TABLET | Freq: Two times a day (BID) | ORAL | 3 refills | Status: DC

## 2021-12-15 MED ORDER — METOPROLOL SUCCINATE ER 100 MG PO TB24: 100.0000 mg | ORAL_TABLET | Freq: Every day | ORAL | 0 refills | Status: DC

## 2021-12-15 NOTE — Assessment & Plan Note (Signed)
EF on echocardiogram in 2021 was 40-45. EF on Myoview in 2021 was 52. Continue Toprol XL 150 mg once daily. Obtain F/u echocardiogram. If EF worse, will need to max GDMT as BP will tolerate.

## 2021-12-15 NOTE — Patient Instructions (Signed)
Medication Instructions:  Your physician has recommended you make the following change in your medication:   STOP Lopressor 50  START Toprol XL 50 taking 1 daily along with the 100 mg   *If you need a refill on your cardiac medications before your next appointment, please call your pharmacy*   Lab Work: None ordered  If you have labs (blood work) drawn today and your tests are completely normal, you will receive your results only by: [place] (if you have MyChart) OR A paper copy in the mail If you have any lab test that is abnormal or we need to change your treatment, we will call you to review the results.   Testing/Procedures: Your physician has requested that you have an echocardiogram. Echocardiography is a painless test that uses sound waves to create images of your heart. It provides your doctor with information about the size and shape of your heart and how well your heart's chambers and valves are working. This procedure takes approximately one hour. There are no restrictions for this procedure.    Follow-Up: At [facility], you and your health needs are our priority.  As part of our continuing mission to provide you with exceptional heart care, we have created designated Provider Care Teams.  These Care Teams include your primary Cardiologist (physician) and Advanced Practice Providers (APPs -  Physician Assistants and Nurse Practitioners) who all work together to provide you with the care you need, when you need it.  We recommend signing up for the patient portal called "MyChart".  Sign up information is provided on this After Visit Summary.  MyChart is used to connect with patients for Virtual Visits (Telemedicine).  Patients are able to view lab/test results, encounter notes, upcoming appointments, etc.  Non-urgent messages can be sent to your provider as well.   To learn more about what you can do with MyChart, go to [email or web address].    Your next  appointment:   6 month(s)  The format for your next appointment:   In Person  Provider:   Mertie Moores, MD  or Richardson Dopp, PA-C         Other Instructions  Echocardiogram An echocardiogram is a test that uses sound waves (ultrasound) to produce images of the heart. Images from an echocardiogram can provide important information about: Heart size and shape. The size and thickness and movement of your heart's walls. Heart muscle function and strength. Heart valve function or if you have stenosis. Stenosis is when the heart valves are too narrow. If blood is flowing backward through the heart valves (regurgitation). A tumor or infectious growth around the heart valves. Areas of heart muscle that are not working well because of poor blood flow or injury from a heart attack. Aneurysm detection. An aneurysm is a weak or damaged part of an artery wall. The wall bulges out from the normal force of blood pumping through the body. Tell a health care provider about: Any allergies you have. All medicines you are taking, including vitamins, herbs, eye drops, creams, and over-the-counter medicines. Any blood disorders you have. Any surgeries you have had. Any medical conditions you have. Whether you are pregnant or may be pregnant. What are the risks? Generally, this is a safe test. However, problems may occur, including an allergic reaction to dye (contrast) that may be used during the test. What happens before the test? No specific preparation is needed. You may eat and drink normally. What happens during the test?  You  will take off your clothes from the waist up and put on a hospital gown. Electrodes or electrocardiogram (ECG)patches may be placed on your chest. The electrodes or patches are then connected to a device that monitors your heart rate and rhythm. You will lie down on a table for an ultrasound exam. A gel will be applied to your chest to help sound waves pass through your  skin. A handheld device, called a transducer, will be pressed against your chest and moved over your heart. The transducer produces sound waves that travel to your heart and bounce back (or "echo" back) to the transducer. These sound waves will be captured in real-time and changed into images of your heart that can be viewed on a video monitor. The images will be recorded on a computer and reviewed by your health care provider. You may be asked to change positions or hold your breath for a short time. This makes it easier to get different views or better views of your heart. In some cases, you may receive contrast through an IV in one of your veins. This can improve the quality of the pictures from your heart. The procedure may vary among health care providers and hospitals. What can I expect after the test? You may return to your normal, everyday life, including diet, activities, and medicines, unless your health care provider tells you not to do that. Follow these instructions at home: It is up to you to get the results of your test. Ask your health care provider, or the department that is doing the test, when your results will be ready. Keep all follow-up visits. This is important. Summary An echocardiogram is a test that uses sound waves (ultrasound) to produce images of the heart. Images from an echocardiogram can provide important information about the size and shape of your heart, heart muscle function, heart valve function, and other possible heart problems. You do not need to do anything to prepare before this test. You may eat and drink normally. After the echocardiogram is completed, you may return to your normal, everyday life, unless your health care provider tells you not to do that. This information is not intended to replace advice given to you by your health care provider. Make sure you discuss any questions you have with your health care provider. Document Revised: 11/25/2020  Document Reviewed: 11/05/2019 Elsevier Patient Education  [place]

## 2021-12-15 NOTE — Assessment & Plan Note (Signed)
Rate is controlled. She is tolerating anticoagulation. She has been in atrial fibrillation for several years now. She is asymptomatic. Her last echocardiogram in 2021 showed mild BAE. Given contemporaneous data which supports rhythm control due to improved long term outcomes, I have recommended that we go ahead and repeat her echocardiogram now. If her atria are severely dilated, the likelihood of restoring NSR wold be very low. However, if her atria are just mildly dilated, it may be worthwhile to refer to EP to discuss a rhythm control strategy (drugs vs PVI ablation). I reviewed all of this with her. Continue Eliquis 5 mg twice daily, Toprol XL 150 mg once daily. F/u in 6 mos.

## 2021-12-23 ENCOUNTER — Ambulatory Visit: Payer: Medicare PPO

## 2021-12-30 ENCOUNTER — Ambulatory Visit (INDEPENDENT_AMBULATORY_CARE_PROVIDER_SITE_OTHER): Payer: Medicare PPO

## 2021-12-30 DIAGNOSIS — Z23 Encounter for immunization: Secondary | ICD-10-CM

## 2021-12-31 ENCOUNTER — Ambulatory Visit: Payer: Medicare PPO

## 2021-12-31 ENCOUNTER — Ambulatory Visit (HOSPITAL_COMMUNITY): Payer: Medicare PPO | Attending: Cardiology

## 2021-12-31 DIAGNOSIS — I4821 Permanent atrial fibrillation: Secondary | ICD-10-CM | POA: Diagnosis not present

## 2021-12-31 DIAGNOSIS — I428 Other cardiomyopathies: Secondary | ICD-10-CM | POA: Diagnosis not present

## 2022-01-16 LAB — ECHOCARDIOGRAM COMPLETE
Area-P 1/2: 4.82 cm2
MV M vel: 5.01 m/s
MV Peak grad: 100.2 mmHg
Radius: 0.8 cm
S' Lateral: 2.9 cm

## 2022-01-25 DIAGNOSIS — L578 Other skin changes due to chronic exposure to nonionizing radiation: Secondary | ICD-10-CM | POA: Diagnosis not present

## 2022-01-25 DIAGNOSIS — L570 Actinic keratosis: Secondary | ICD-10-CM | POA: Diagnosis not present

## 2022-01-25 DIAGNOSIS — D225 Melanocytic nevi of trunk: Secondary | ICD-10-CM | POA: Diagnosis not present

## 2022-01-25 DIAGNOSIS — L814 Other melanin hyperpigmentation: Secondary | ICD-10-CM | POA: Diagnosis not present

## 2022-01-25 DIAGNOSIS — L821 Other seborrheic keratosis: Secondary | ICD-10-CM | POA: Diagnosis not present

## 2022-02-28 ENCOUNTER — Encounter: Payer: Self-pay | Admitting: Cardiovascular Disease

## 2022-02-28 NOTE — Progress Notes (Unsigned)
Cardiology Office Note:    Date:  03/01/2022   ID:  GERARDA CONKLIN, DOB 06-22-1945, MRN [id number]  PCP:  [name], MD  Cardiologist:   Jazelle Achey Electrophysiologist:  None   Referring MD: [name], MD   Chief Complaint  Patient presents with   Atrial Fibrillation         Sept. 15, 2020   PERSIS GRAFFIUS is a 76 y.o. female with a recent diagnosis of atrial fib with RVR . We were asked to see her today by Dr. Elease Hashimoto for further evaluation of her atrial fib.   Cannot tell when she is in atrial fib or now She had AF with RVR  She was found to have AF RVR.    She found her rapid HR when she put on a pulse oxymeter and found her HR to be 160 .  Noticed palpitations when she was busy taking care of family who was visiting her from out of town.  Is also on Eliquis .  She is feeling better.   Is an ovarian cancer surviver.   Has neuropathy from chemo .   March 13, 2019:  Melady is seen today for follow-up visit for her atrial fibrillation.  She had a cardioversion on December 17, 2018. She feels much better after the cardioversion. She is gained a little bit of weight since we last saw her. Wt is 190  ( up 8 lbs from Sept, 2020)  Knows she needs to walk more   Feb. 28, 2022: Eleana is seen today for follow up of her atrial fib  She was seen by Richardson Dopp, [place] in Aug. 2021. Wt today is 149 lbs ,  Feeling much better. Has had some ankle issues,  Cannot wak,  Bought a stationary bike  Has had some hematura.  Will likely need a cystoscopy .  She may hold her Eliquis for 2 days prior to cystoscopy   February 26, 2021: Shavonda is seen today for a follow-up visit.  She has a history of chronic atrial fibrillation.   Has moderate - severe MR We are watching closely    Is on eliquis   No cp, no dyspnea  Walks and does stationary bike regularly   She wants to cancel the echo scheduled for next week   Dec. 5, 2023  Rhya is seen for follow up  of her atrial fib . Moderate - severe MR  Has continued to lose weight   157 lbs   Cannot tell if she is in atrial fib Saw Scott 3 months ago  Echo from Oct. 6 shows mildly reduced LV function     Past Medical History:  Diagnosis Date   Anticoagulant long-term use    eliquis--- managed by cardiology   Arthritis    knees   Hematuria    History of cystitis 5053   follicular cystitis s/p cysto bx   History of kidney stones    History of ovarian cancer in adulthood released from oncologist-- dr Jana Hakim per lov note in epic 07-11-2011 in remission   dx 01/ 2007;  02/ 2007 s/p exp. lap w/ TAH/ BSO/ Appendectomy/ Omectomy/ Splenectomy/ tumor debulking;  Stage IIIC,  completed chemo 07/ 2007;     History of septic shock 2016   secondary to UTI and blood infection from dog bite, resolved   History of thrombosis    per pt had 2007 PAC occlusion due to blood clot, completed coumadin treatment, and PAC removed  History of vertebral fracture    L1 & 2   Lesion of bladder    Nonischemic cardiomyopathy    Echo 7/21: EF 40-45, global HK, normal RVSF, mild BAE, mild MR // Myoview 9/21: EF 52, no ischemia, low risk    Persistent atrial fibrillation [facility]) cardiologist-- dr Jasper Riling   dx 08/ 2020 ;   S/p cardioversion 11/2018 //anticoagulation with Apixaban //Echo 11/2018: EF 45-50, LVH, GRII DD, mildly reduced RV SF, severe LAE   PONV (postoperative nausea and vomiting)    S/P splenectomy 05/03/2005   during surgery for extensive abdominal tumor debulking    Past Surgical History:  Procedure Laterality Date   BLEPHAROPLASTY Bilateral 07-04-2002  '@[facility]'$    UPPER EYELIDS   CARDIOVERSION N/A 12/17/2018   Procedure: CARDIOVERSION;  Surgeon: Buford Dresser, MD;  Location: [facility];  Service: Cardiovascular;  Laterality: N/A;   CATARACT EXTRACTION W/ INTRAOCULAR LENS  IMPLANT, BILATERAL  2000 approx   CESAREAN SECTION  1982;  [place] N/A 09/18/2020   Procedure:  CYSTOSCOPY WITH BIOPSY FULGURATION;  Surgeon: Irine Seal, MD;  Location: [facility];  Service: Urology;  Laterality: N/A;   EXPLORATORY LAPAROTOMY/  TOTAL ABDOMINAL HYSTERECTOMY/  BILATERAL SALPINGOOPHORECTOMY/  EXTENSIVE TUMOR DEBULKING/  APPENDECTOMY/  OMENTECTOMY/  SPLENECTOMY  05/03/2005   GASTRIC BYPASS  1979   intestinal bariatric    LAPAROSCOPIC CHOLECYSTECTOMY  10-19-2009 @ [place]   PORT A CATH INSERTION AND REMOVAL  03/ 2007;  REMOVED 03/ 2008   TRANSURETHRAL RESECTION OF BLADDER  09-13-2005  '@[facility]'$  dr Jeffie Pollock   LESION   VENTRAL HERNIA REPAIR  04-04-2005  @ [place]    Current Medications: Current Meds  Medication Sig   acetaminophen (TYLENOL) 500 MG tablet Take 1,000 mg by mouth every 4 (four) hours as needed for moderate pain or mild pain.   albuterol (VENTOLIN HFA) 108 (90 Base) MCG/ACT inhaler Inhale 2 puffs into the lungs every 6 (six) hours as needed for wheezing or shortness of breath.   apixaban (ELIQUIS) 5 MG TABS tablet Take 1 tablet (5 mg total) by mouth 2 (two) times daily.   COMIRNATY SUSP injection Inject into the muscle.   metoprolol succinate (TOPROL-XL) 100 MG 24 hr tablet Take 1 tablet (100 mg total) by mouth daily. To take in addition with 50 mg Metoprolol Succinate to equal 150 mg   metoprolol succinate (TOPROL-XL) 50 MG 24 hr tablet Take 1 tablet (50 mg total) by mouth daily. Take with or immediately following a meal.   Multiple Vitamin (MULTIVITAMIN) tablet Take 1 tablet by mouth daily.    Ondansetron HCl (ZOFRAN PO) Take 1 tablet by mouth daily as needed (nausea/vomiting).   zolpidem (AMBIEN) 10 MG tablet TAKE 1 TABLET BY MOUTH EVERY DAY AT BEDTIME AS NEEDED FOR SLEEP     Allergies:   Morphine and related, Penicillins, and Methocarbamol   Social History   Socioeconomic History   Marital status: Married    Spouse name: Not on file   Number of children: Not on file   Years of education: Not on file   Highest education level: Master's degree (e.g.,  MA, MS, MEng, MEd, MSW, MBA)  Occupational History   Not on file  Tobacco Use   Smoking status: Never   Smokeless tobacco: Never  Vaping Use   Vaping Use: Never used  Substance and Sexual Activity   Alcohol use: No   Drug use: No   Sexual activity: Yes    Partners: Male  Birth control/protection: Surgical    Comment: 1ST intercourse- 28, partners- 1, hysterectomy  Other Topics Concern   Not on file  Social History Narrative   Not on file   Social Determinants of Health   Financial Resource Strain: Low Risk  (05/24/2021)   Overall Financial Resource Strain (CARDIA)    Difficulty of Paying Living Expenses: Not hard at all  Food Insecurity: No Food Insecurity (05/24/2021)   Hunger Vital Sign    Worried About Running Out of Food in the Last Year: Never true    Ran Out of Food in the Last Year: Never true  Transportation Needs: No Transportation Needs (05/24/2021)   PRAPARE - Hydrologist (Medical): No    Lack of Transportation (Non-Medical): No  Physical Activity: Sufficiently Active (05/24/2021)   Exercise Vital Sign    Days of Exercise per Week: 5 days    Minutes of Exercise per Session: 30 min  Stress: No Stress Concern Present (05/24/2021)   [place]    Feeling of Stress : Not at all  Social Connections: Unknown (05/24/2021)   Social Connection and Isolation Panel [NHANES]    Frequency of Communication with Friends and Family: More than three times a week    Frequency of Social Gatherings with Friends and Family: More than three times a week    Attends Religious Services: Patient refused    Marine scientist or Organizations: Yes    Attends Music therapist: 1 to 4 times per year    Marital Status: Married     Family History: The patient's family history includes Arthritis in her mother and another family member; Cancer in her sister; Cancer (age of onset:  22) in her father; Diabetes in her sister.  ROS:   Please see the history of present illness.     All other systems reviewed and are negative.  EKGs/Labs/Other Studies Reviewed:    The following studies were reviewed today:   EKG:   Recent Labs: 06/11/2021: BUN 15; Creatinine, Ser 0.91; Hemoglobin 12.3; Platelets 411; Potassium 3.3; Sodium 134  Recent Lipid Panel    Component Value Date/Time   CHOL 135 02/24/2021 1015   TRIG 87.0 02/24/2021 1015   HDL 56.30 02/24/2021 1015   CHOLHDL 2 02/24/2021 1015   VLDL 17.4 02/24/2021 1015   LDLCALC 61 02/24/2021 1015    Physical Exam:    Physical Exam: Blood pressure 102/62, pulse 89, height '5\' 2"'$  (1.575 m), weight 157 lb 3.2 oz (71.3 kg), SpO2 97 %.       GEN:  Well nourished, well developed in no acute distress HEENT: Normal NECK: No JVD; No carotid bruits LYMPHATICS: No lymphadenopathy CARDIAC: RRR , soft systolic murmru  RESPIRATORY:  Clear to auscultation without rales, wheezing or rhonchi  ABDOMEN: Soft, non-tender, non-distended MUSCULOSKELETAL:  No edema; No deformity  SKIN: Warm and dry NEUROLOGIC:  Alert and oriented x 3     ASSESSMENT:    1. Permanent atrial fibrillation ([place])      PLAN:      1.  Atrial fib:    2.  Obesity:   3.  Mild systolic congestive heart failure:     Medication Adjustments/Labs and Tests Ordered: Current medicines are reviewed at length with the patient today.  Concerns regarding medicines are outlined above.  No orders of the defined types were placed in this encounter.   No orders of the defined types  were placed in this encounter.     Patient Instructions  Medication Instructions:  Your physician recommends that you continue on your current medications as directed. Please refer to the Current Medication list given to you today.  *If you need a refill on your cardiac medications before your next appointment, please call your pharmacy*   Lab Work: NONE If you  have labs (blood work) drawn today and your tests are completely normal, you will receive your results only by: [place] (if you have MyChart) OR A paper copy in the mail If you have any lab test that is abnormal or we need to change your treatment, we will call you to review the results.   Testing/Procedures: NONE   Follow-Up: At [facility], you and your health needs are our priority.  As part of our continuing mission to provide you with exceptional heart care, we have created designated Provider Care Teams.  These Care Teams include your primary Cardiologist (physician) and Advanced Practice Providers (APPs -  Physician Assistants and Nurse Practitioners) who all work together to provide you with the care you need, when you need it.  We recommend signing up for the patient portal called "MyChart".  Sign up information is provided on this After Visit Summary.  MyChart is used to connect with patients for Virtual Visits (Telemedicine).  Patients are able to view lab/test results, encounter notes, upcoming appointments, etc.  Non-urgent messages can be sent to your provider as well.   To learn more about what you can do with MyChart, go to [email or web address].    Your next appointment:   1 year(s)  The format for your next appointment:   In Person  Provider:   Mertie Moores, MD       Important Information About Sugar         Signed, Mertie Moores, MD  03/01/2022 11:42 AM    [place]

## 2022-03-01 ENCOUNTER — Encounter: Payer: Self-pay | Admitting: Cardiovascular Disease

## 2022-03-01 ENCOUNTER — Ambulatory Visit: Payer: Medicare PPO | Attending: Cardiovascular Disease | Admitting: Cardiovascular Disease

## 2022-03-01 VITALS — BP 102/62 | HR 89 | Ht 62.0 in | Wt 157.2 lb

## 2022-03-01 DIAGNOSIS — I4821 Permanent atrial fibrillation: Secondary | ICD-10-CM | POA: Diagnosis not present

## 2022-03-01 NOTE — Patient Instructions (Signed)
Medication Instructions:  Your physician recommends that you continue on your current medications as directed. Please refer to the Current Medication list given to you today.  *If you need a refill on your cardiac medications before your next appointment, please call your pharmacy*   Lab Work: NONE If you have labs (blood work) drawn today and your tests are completely normal, you will receive your results only by: MyChart Message (if you have MyChart) OR A paper copy in the mail If you have any lab test that is abnormal or we need to change your treatment, we will call you to review the results.   Testing/Procedures: NONE   Follow-Up: At [facility], you and your health needs are our priority.  As part of our continuing mission to provide you with exceptional heart care, we have created designated Provider Care Teams.  These Care Teams include your primary Cardiologist (physician) and Advanced Practice Providers (APPs -  Physician Assistants and Nurse Practitioners) who all work together to provide you with the care you need, when you need it.  We recommend signing up for the patient portal called "MyChart".  Sign up information is provided on this After Visit Summary.  MyChart is used to connect with patients for Virtual Visits (Telemedicine).  Patients are able to view lab/test results, encounter notes, upcoming appointments, etc.  Non-urgent messages can be sent to your provider as well.   To learn more about what you can do with MyChart, go to [email or web address].    Your next appointment:   1 year(s)  The format for your next appointment:   In Person  Provider:   Philip [name], MD       Important Information About Sugar       

## 2022-03-18 ENCOUNTER — Other Ambulatory Visit: Payer: Self-pay | Admitting: Family Medicine

## 2022-03-21 NOTE — Telephone Encounter (Signed)
Last OV 06/15/21 for acute reasons Last CPE 02/24/21

## 2022-04-08 ENCOUNTER — Ambulatory Visit: Payer: Medicare PPO | Admitting: Family Medicine

## 2022-04-08 ENCOUNTER — Encounter: Payer: Self-pay | Admitting: Family Medicine

## 2022-04-08 VITALS — BP 110/70 | HR 82 | Temp 97.6°F | Ht 62.0 in | Wt 157.0 lb

## 2022-04-08 DIAGNOSIS — H109 Unspecified conjunctivitis: Secondary | ICD-10-CM

## 2022-04-08 DIAGNOSIS — R059 Cough, unspecified: Secondary | ICD-10-CM | POA: Diagnosis not present

## 2022-04-08 LAB — POC COVID19 BINAXNOW: SARS Coronavirus 2 Ag: NEGATIVE

## 2022-04-08 MED ORDER — TOBRAMYCIN 0.3 % OP SOLN
2.0000 [drp] | OPHTHALMIC | 0 refills | Status: DC
Start: 2022-04-08 — End: 2023-02-27

## 2022-04-08 NOTE — Patient Instructions (Signed)
Consider increasing the Mucinex to 1,200 mg twice daily  Follow up for any fever or increased shortness of breath.

## 2022-04-08 NOTE — Progress Notes (Signed)
Established Patient Office Visit  Subjective   Patient ID: [name], female    DOB: [birth year]  Age: 77 y.o. MRN: [id number]  Chief Complaint  Patient presents with   Nasal Congestion    Patient complains of nasla congestion, x3 days   Hoarse    Patient complains of hearseness, x3 days   Conjunctivitis    Patient complains of conjunctivitis in left eye   Cough    Patient complains of cough, Non productive, x3 days, Tried Robitussin and antihistamines with little relief     HPI   [name] is seen with onset of illness about 4 days ago.  She had some nasal congestion and intermittent hoarseness and cough which is mostly nonproductive. She has been using some over-the-counter Robitussin DM.  Cough is slightly better today.  No fever.  No chills.  No dyspnea.  She also relates some drainage left past couple days.  She states she woke up with her "glued shut ".  No blurred vision.  Past Medical History:  Diagnosis Date   Anticoagulant long-term use    eliquis--- managed by cardiology   Arthritis    knees   Hematuria    History of cystitis 1443   follicular cystitis s/p cysto bx   History of kidney stones    History of ovarian cancer in adulthood released from oncologist-- dr Jana Hakim per lov note in epic 07-11-2011 in remission   dx 01/ 2007;  02/ 2007 s/p exp. lap w/ TAH/ BSO/ Appendectomy/ Omectomy/ Splenectomy/ tumor debulking;  Stage IIIC,  completed chemo 07/ 2007;     History of septic shock 2016   secondary to UTI and blood infection from dog bite, resolved   History of thrombosis    per pt had 2007 PAC occlusion due to blood clot, completed coumadin treatment, and PAC removed   History of vertebral fracture    L1 & 2   Lesion of bladder    Nonischemic cardiomyopathy    Echo 7/21: EF 40-45, global HK, normal RVSF, mild BAE, mild MR // Myoview 9/21: EF 52, no ischemia, low risk    Persistent atrial fibrillation [facility]) cardiologist-- dr Jasper Riling   dx 08/ 2020 ;    S/p cardioversion 11/2018 //anticoagulation with Apixaban //Echo 11/2018: EF 45-50, LVH, GRII DD, mildly reduced RV SF, severe LAE   PONV (postoperative nausea and vomiting)    S/P splenectomy 05/03/2005   during surgery for extensive abdominal tumor debulking   Past Surgical History:  Procedure Laterality Date   BLEPHAROPLASTY Bilateral 07-04-2002  '@[facility]'$    UPPER EYELIDS   CARDIOVERSION N/A 12/17/2018   Procedure: CARDIOVERSION;  Surgeon: Buford Dresser, MD;  Location: [place];  Service: Cardiovascular;  Laterality: N/A;   CATARACT EXTRACTION W/ INTRAOCULAR LENS  IMPLANT, BILATERAL  2000 approx   CESAREAN SECTION  1982;  [place] N/A 09/18/2020   Procedure: CYSTOSCOPY WITH BIOPSY FULGURATION;  Surgeon: Irine Seal, MD;  Location: [place];  Service: Urology;  Laterality: N/A;   EXPLORATORY LAPAROTOMY/  TOTAL ABDOMINAL HYSTERECTOMY/  BILATERAL SALPINGOOPHORECTOMY/  EXTENSIVE TUMOR DEBULKING/  APPENDECTOMY/  OMENTECTOMY/  SPLENECTOMY  05/03/2005   GASTRIC BYPASS  1979   intestinal bariatric    LAPAROSCOPIC CHOLECYSTECTOMY  10-19-2009 @ [place]   PORT A CATH INSERTION AND REMOVAL  03/ 2007;  REMOVED 03/ 2008   TRANSURETHRAL RESECTION OF BLADDER  09-13-2005  '@[facility]'$  dr Jeffie Pollock   LESION   VENTRAL HERNIA REPAIR  04-04-2005  @  [place]    reports that she has never smoked. She has never used smokeless tobacco. She reports that she does not drink alcohol and does not use drugs. family history includes Arthritis in her mother and another family member; Cancer in her sister; Cancer (age of onset: 60) in her father; Diabetes in her sister. Allergies  Allergen Reactions   Morphine And Related Nausea And Vomiting   Penicillins Hives    Did it involve swelling of the face/tongue/throat, SOB, or low BP? No Did it involve sudden or severe rash/hives, skin peeling, or any reaction on the inside of your mouth or nose? No Did you need to seek medical attention at a  hospital or doctor's office? Unknown When did it last happen?      88 + years If all above answers are "NO", may proceed with cephalosporin use.    Methocarbamol Other (See Comments)    Stomach upset    Review of Systems  Constitutional:  Negative for chills and fever.  HENT:  Positive for congestion. Negative for sinus pain.   Eyes:  Positive for discharge. Negative for blurred vision.  Respiratory:  Positive for cough.       Objective:     BP 110/70 (BP Location: Left Arm, Patient Position: Sitting, Cuff Size: Normal)   Pulse 82   Temp 97.6 F (36.4 C) (Oral)   Ht '5\' 2"'$  (1.575 m)   Wt 157 lb (71.2 kg)   SpO2 98%   BMI 28.72 kg/m  BP Readings from Last 3 Encounters:  04/08/22 110/70  03/01/22 102/62  12/15/21 113/62   Wt Readings from Last 3 Encounters:  04/08/22 157 lb (71.2 kg)  03/01/22 157 lb 3.2 oz (71.3 kg)  12/15/21 158 lb (71.7 kg)      Physical Exam Vitals reviewed.  Constitutional:      General: She is not in acute distress.    Appearance: Normal appearance. She is not ill-appearing.  HENT:     Right Ear: Tympanic membrane normal.     Left Ear: Tympanic membrane normal.  Eyes:     Comments: Right conjunctive is normal.  Left conjunctive is erythematous.  Near the midportion of the lower lid there appears to be a early stye  Cardiovascular:     Rate and Rhythm: Normal rate and regular rhythm.  Pulmonary:     Effort: Pulmonary effort is normal.     Breath sounds: Normal breath sounds. No wheezing or rales.  Musculoskeletal:     Cervical back: Neck supple.  Lymphadenopathy:     Cervical: No cervical adenopathy.  Neurological:     Mental Status: She is alert.      Results for orders placed or performed in visit on 04/08/22  POC COVID-19  Result Value Ref Range   SARS Coronavirus 2 Ag Negative Negative      The 10-year ASCVD risk score (Arnett DK, et al., 2019) is: 10.4%    Assessment & Plan:   #1 cough.  Suspect acute viral illness.   COVID testing negative.  Nonfocal exam.  No fever. -Increase Mucinex over-the-counter 1200 mg twice daily -Plenty of fluids -Follow-up promptly for any fever or increased shortness of breath  #2 bacterial conjunctivitis left eye.  May have early stye as well.  Continue warm compresses several times daily.  Tobramycin ophthalmic solution 0.3% 2 drops left eye every 4 hours follow awake until infection clears   No follow-ups on file.    Carolann Littler, MD

## 2022-05-24 DIAGNOSIS — H00012 Hordeolum externum right lower eyelid: Secondary | ICD-10-CM | POA: Diagnosis not present

## 2022-06-17 ENCOUNTER — Ambulatory Visit: Payer: Medicare PPO | Admitting: Cardiovascular Disease

## 2022-07-26 DIAGNOSIS — L814 Other melanin hyperpigmentation: Secondary | ICD-10-CM | POA: Diagnosis not present

## 2022-07-26 DIAGNOSIS — L821 Other seborrheic keratosis: Secondary | ICD-10-CM | POA: Diagnosis not present

## 2022-07-26 DIAGNOSIS — D225 Melanocytic nevi of trunk: Secondary | ICD-10-CM | POA: Diagnosis not present

## 2022-07-26 DIAGNOSIS — L570 Actinic keratosis: Secondary | ICD-10-CM | POA: Diagnosis not present

## 2022-08-04 ENCOUNTER — Ambulatory Visit (INDEPENDENT_AMBULATORY_CARE_PROVIDER_SITE_OTHER): Payer: Medicare PPO | Admitting: Obstetrics & Gynecology

## 2022-08-04 ENCOUNTER — Encounter: Payer: Self-pay | Admitting: Obstetrics & Gynecology

## 2022-08-04 VITALS — BP 114/80 | HR 54 | Ht 60.25 in | Wt 153.0 lb

## 2022-08-04 DIAGNOSIS — Z90722 Acquired absence of ovaries, bilateral: Secondary | ICD-10-CM | POA: Diagnosis not present

## 2022-08-04 DIAGNOSIS — Z90710 Acquired absence of both cervix and uterus: Secondary | ICD-10-CM | POA: Diagnosis not present

## 2022-08-04 DIAGNOSIS — Z01419 Encounter for gynecological examination (general) (routine) without abnormal findings: Secondary | ICD-10-CM

## 2022-08-04 DIAGNOSIS — M8000XD Age-related osteoporosis with current pathological fracture, unspecified site, subsequent encounter for fracture with routine healing: Secondary | ICD-10-CM | POA: Diagnosis not present

## 2022-08-04 DIAGNOSIS — Z9079 Acquired absence of other genital organ(s): Secondary | ICD-10-CM

## 2022-08-04 DIAGNOSIS — Z8543 Personal history of malignant neoplasm of ovary: Secondary | ICD-10-CM

## 2022-08-04 MED ORDER — ALENDRONATE SODIUM 70 MG PO TABS: 70.0000 mg | ORAL_TABLET | ORAL | 4 refills | Status: DC

## 2022-08-04 NOTE — Progress Notes (Signed)
[name] 09-12-1945 [id number]   History:    77 y.o. G3P2A1L2  Married.  Retired Theatre manager  2 grand-children 14 and 4 yo.   RP:  Established patient presenting for annual gyn exam    HPI: Status post TAH/BSO staging, appendectomy, splenectomy with Dr. Stanford Breed in 2007 for ovarian cancer stage III. Chemotherapy after for 9 weeks.  Ca125 has been negative since then.  No pelvic pain.  Pap test negative in 09/2019. No indication to repeat at this time.  Breast normal.  Last mammo 03/2021 Lt breast Neg/Rt Dx mammo/US breast Neg in 04/2021. Schedule Mammo. [place] 2009.  BMI 29.63.  On [employer].  Active physically, but difficulty doing fitness because of Arthritis.  Health labs with family physician. Bone Density 06/29/2021: Osteoporosis at the Left Total Hip/Left Femoral Neck with T-Score at -2.5.  Started on Alendronate on 07/09/21.   Past medical history,surgical history, family history and social history were all reviewed and documented in the EPIC chart.  Gynecologic History No LMP recorded. Patient has had a hysterectomy.  Obstetric History OB History  Gravida Para Term Preterm AB Living  3 2 2   1 2   SAB IAB Ectopic Multiple Live Births  1       2    # Outcome Date GA Lbr Len/2nd Weight Sex Delivery Anes PTL Lv  3 Term 1982 [redacted]w[redacted]d  6 lb 11 oz (3.033 kg) M CS-LTranv Spinal  LIV  2 Term 1976 [redacted]w[redacted]d  7 lb 9 oz (3.43 kg) F CS-LTranv Spinal  LIV  1 SAB         DEC     ROS: A ROS was performed and pertinent positives and negatives are included in the history. GENERAL: No fevers or chills. HEENT: No change in vision, no earache, sore throat or sinus congestion. NECK: No pain or stiffness. CARDIOVASCULAR: No chest pain or pressure. No palpitations. PULMONARY: No shortness of breath, cough or wheeze. GASTROINTESTINAL: No abdominal pain, nausea, vomiting or diarrhea, melena or bright red blood per rectum. GENITOURINARY: No urinary frequency, urgency, hesitancy or dysuria.  MUSCULOSKELETAL: No joint or muscle pain, no back pain, no recent trauma. DERMATOLOGIC: No rash, no itching, no lesions. ENDOCRINE: No polyuria, polydipsia, no heat or cold intolerance. No recent change in weight. HEMATOLOGICAL: No anemia or easy bruising or bleeding. NEUROLOGIC: No headache, seizures, numbness, tingling or weakness. PSYCHIATRIC: No depression, no loss of interest in normal activity or change in sleep pattern.     Exam:   BP 114/80   Pulse (!) 54   Ht 5' 0.25" (1.53 m)   Wt 153 lb (69.4 kg)   SpO2 97%   BMI 29.63 kg/m   Body mass index is 29.63 kg/m.  General appearance : Well developed well nourished female. No acute distress HEENT: Eyes: no retinal hemorrhage or exudates,  Neck supple, trachea midline, no carotid bruits, no thyroidmegaly Lungs: Clear to auscultation, no rhonchi or wheezes, or rib retractions  Heart: Regular rate and rhythm, no murmurs or gallops Breast:Examined in sitting and supine position were symmetrical in appearance, no palpable masses or tenderness,  no skin retraction, no nipple inversion, no nipple discharge, no skin discoloration, no axillary or supraclavicular lymphadenopathy Abdomen: no palpable masses or tenderness, no rebound or guarding Extremities: no edema or skin discoloration or tenderness  Pelvic: Vulva: Normal             Vagina: No gross lesions or discharge  Cervix/Uterus absent  Adnexa  Without masses or  tenderness  Anus: Normal   Assessment/Plan:  77 y.o. female for annual exam   1. Well female exam with routine gynecological exam Status post TAH/BSO staging, appendectomy, splenectomy with Dr. Stanford Breed in 2007 for ovarian cancer stage III. Chemotherapy after for 9 weeks.  Ca125 has been negative since then.  No pelvic pain.  Pap test negative in 09/2019. No indication to repeat at this time.  Breast normal.  Last mammo 03/2021 Lt breast Neg/Rt Dx mammo/US breast Neg in 04/2021. Schedule Mammo. [place] 2009.  BMI 29.63.   On [employer].  Active physically, but difficulty doing fitness because of Arthritis.  Health labs with family physician. Bone Density 06/29/2021: Osteoporosis at the Left Total Hip/Left Femoral Neck with T-Score at -2.5.  Started on Alendronate on 07/09/21.  2. Status post total abdominal hysterectomy and bilateral salpingo-oophorectomy (TAH-BSO)  3. Personal history of ovarian cancer  4. Age-related osteoporosis with current pathological fracture with routine healing, subsequent encounter Bone Density 06/29/2021: Osteoporosis at the Left Total Hip/Left Femoral Neck with T-Score at -2.5.  Started on Alendronate on 07/09/21. Alendronate prescription sent to pharmacy.  Other orders - alendronate (FOSAMAX) 70 MG tablet; Take 1 tablet (70 mg total) by mouth every 7 (seven) days. Take with a full glass of water on an empty stomach.   Genia Del MD, 11:40 AM

## 2022-08-30 ENCOUNTER — Other Ambulatory Visit: Payer: Self-pay | Admitting: Cardiovascular Disease

## 2022-10-25 ENCOUNTER — Other Ambulatory Visit: Payer: Self-pay | Admitting: Family Medicine

## 2022-11-20 IMAGING — CT CT L SPINE W/O CM
3 of 4 series · 12 of 33 positions shown, 14 images · non-contrast
Comparison: Chest radiographs 05/25/2021. Abdominopelvic CT
06/09/2020.

CLINICAL DATA: Low back pain for 1 month.  Increased fracture risk.



[Series 4: l spine st · axial · 0.28mm/px · z∈[-266,-112]mm · 4 of 115 slices shown, 5 images]
[im 20/115  soft-tissue]
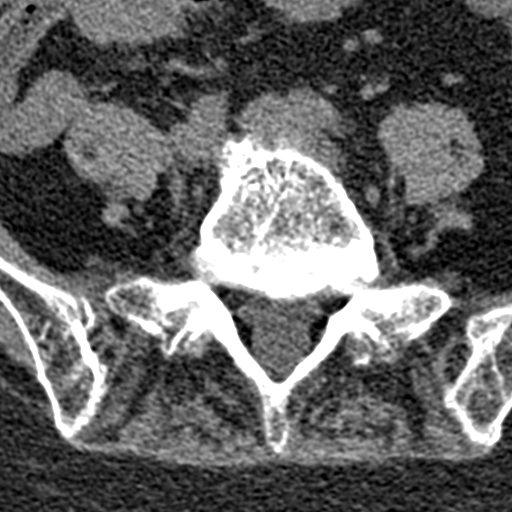
[im 20/115  bone]
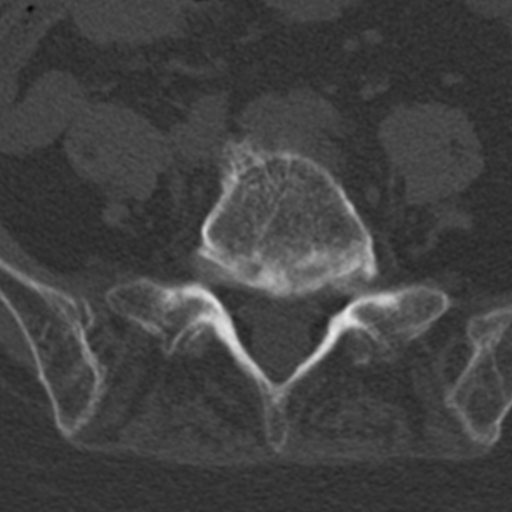
[im 39/115  bone]
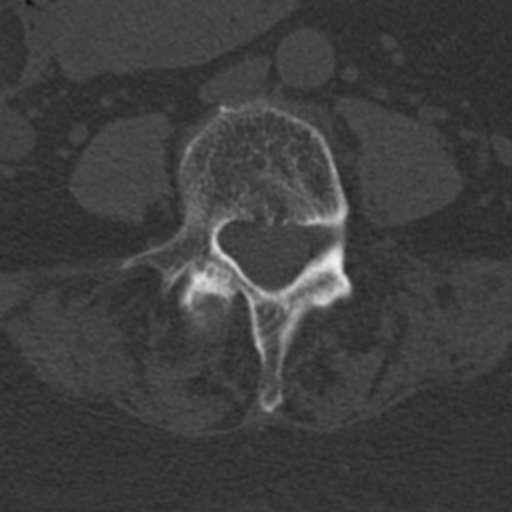
[im 77/115  bone]
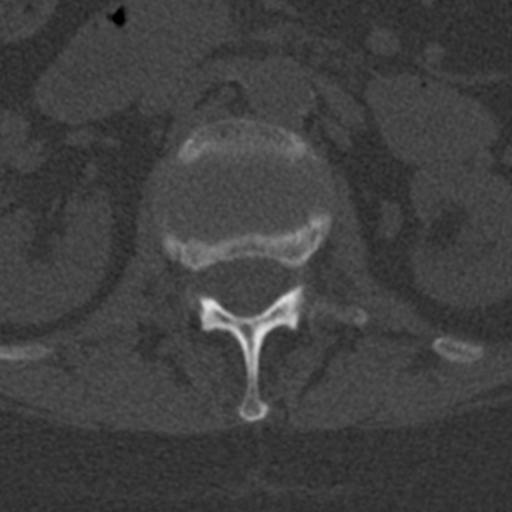
[im 96/115  bone]
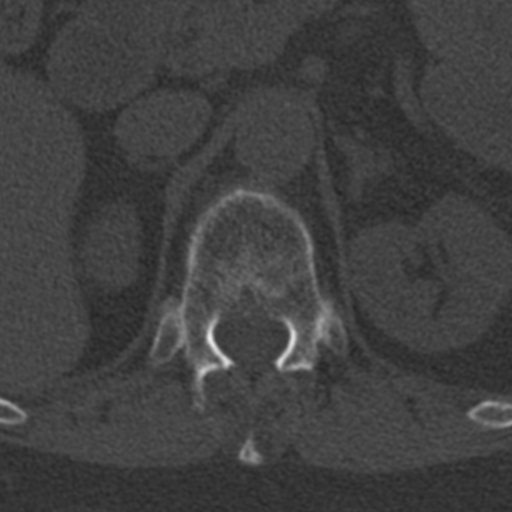

[Series 8: coronal bone · coronal · 0.34mm/px · 3 of 66 slices shown]
[im 14/66  bone]
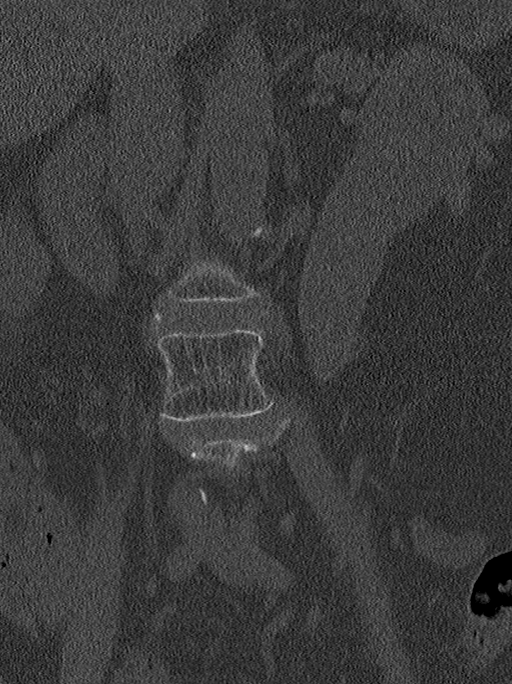
[im 27/66  bone]
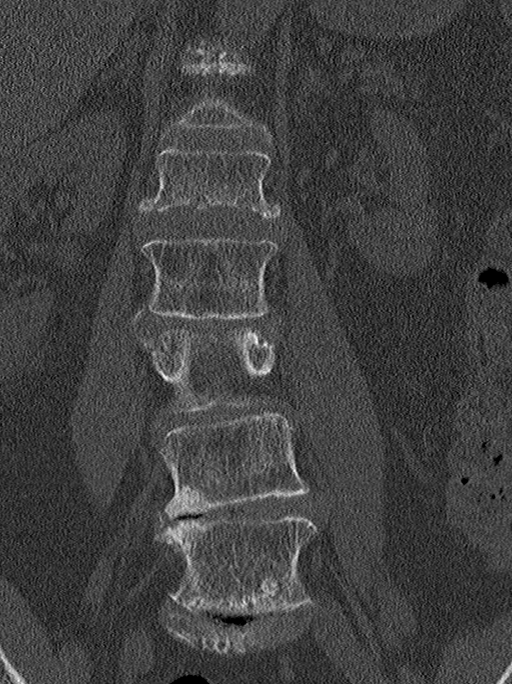
[im 40/66  bone]
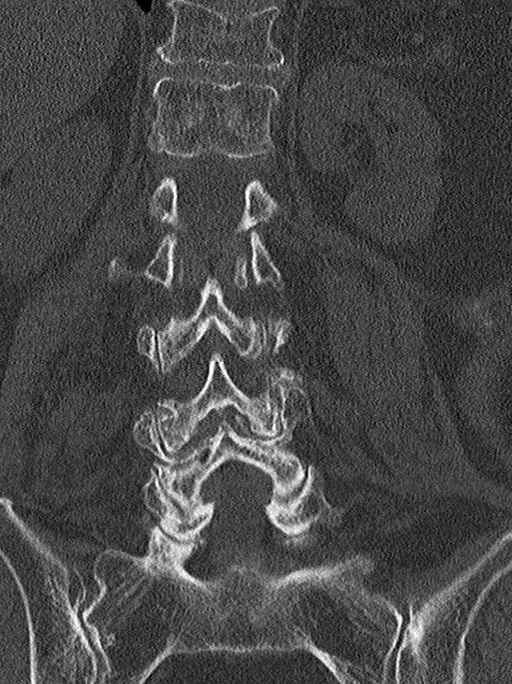

[Series 10: sagittal st · sagittal · 0.34mm/px · 5 of 61 slices shown, 6 images]
[im 21/61  bone]
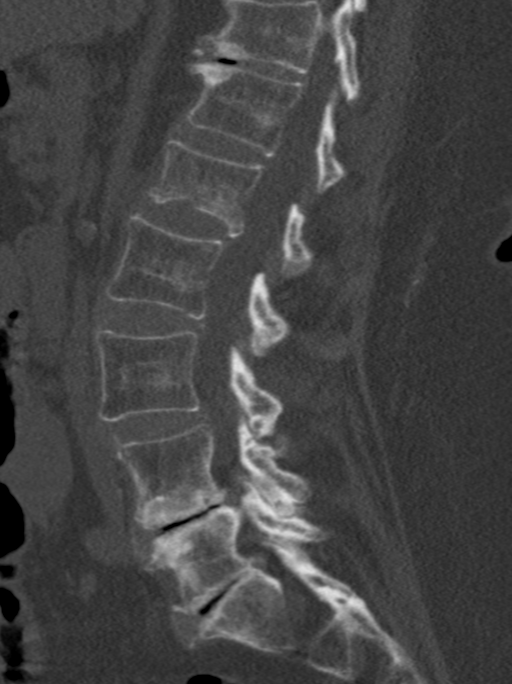
[im 26/61  bone]
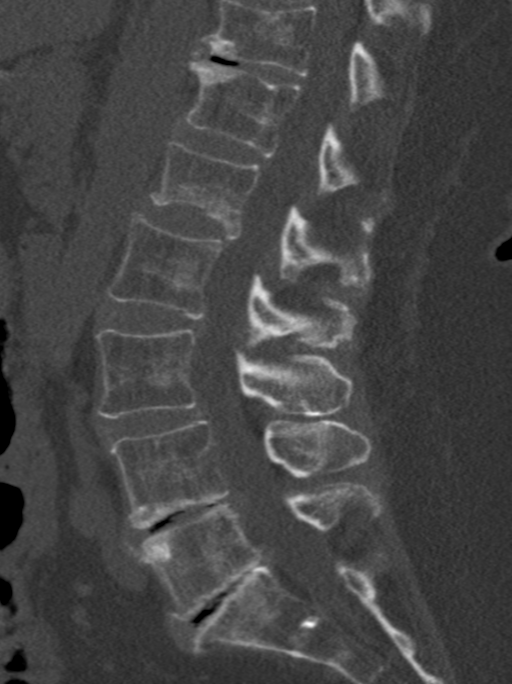
[im 31/61  soft-tissue]
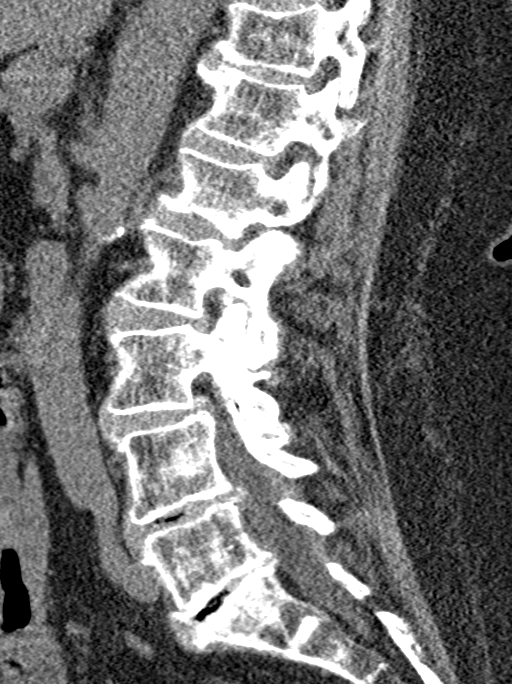
[im 31/61  bone]
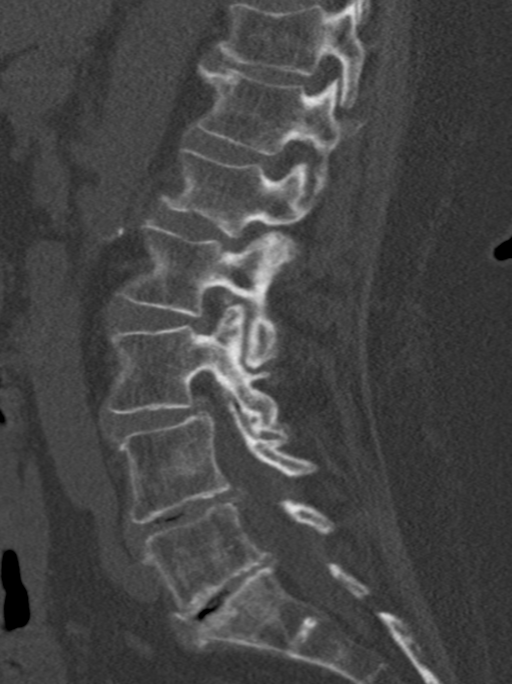
[im 36/61  bone]
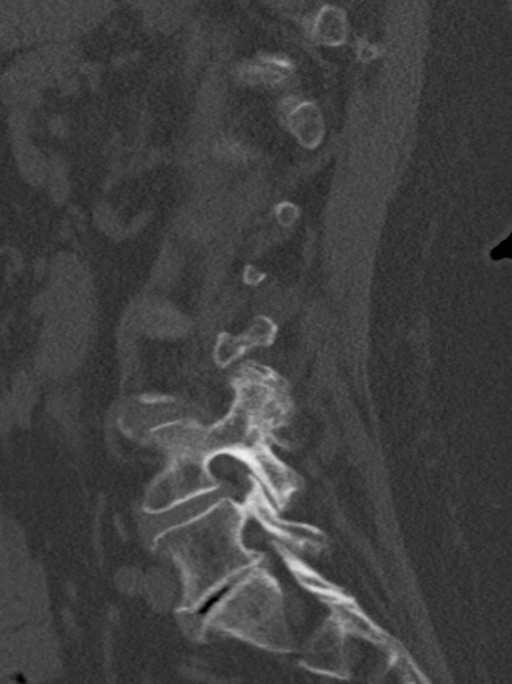
[im 41/61  bone]
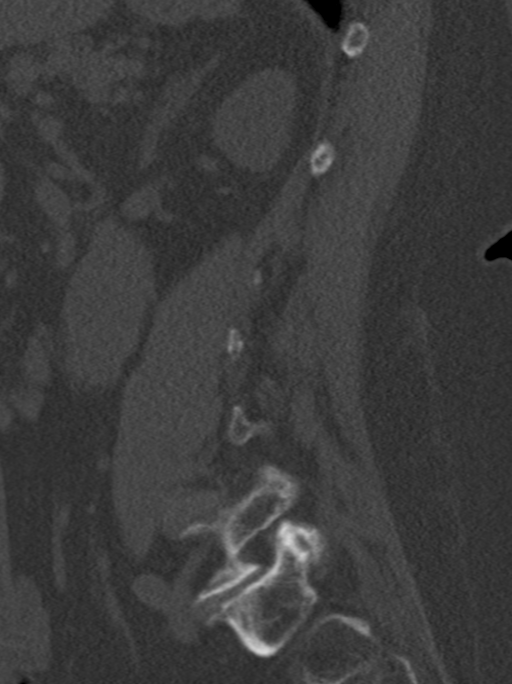

[12 of 33 positions shown; findings below may reference images not displayed]

FINDINGS: Segmentation: There are 5 lumbar type vertebral bodies.

Alignment: Stable degenerative grade 1 anterolisthesis at L3-4 and
mild convex right scoliosis.

Vertebrae: The bones are diffusely demineralized. There is a new
mild inferior endplate compression fracture at L1 compared with
prior CT, and this has features suggesting that it is acute or
subacute. This results in approximately 20% loss of vertebral body
height. No significant osseous retropulsion. The posterior elements
are intact.

Paraspinal and other soft tissues: Possible mild anterior paraspinal
soft tissue stranding at L1-2. No focal fluid collection. Aortic and
branch vessel atherosclerosis are noted.

Disc levels:

T11-12: Chronic degenerative disc disease with loss of disc height,
vacuum phenomenon and anterior osteophytes. No spinal stenosis.

T12-L1: No significant findings.

L1-2: As above, new inferior endplate compression deformity at L1
which may be acute/subacute. Mild disc bulging.

L2-3: Preserved disc height. Mild facet hypertrophy. No spinal
stenosis.

L3-4: Advanced bilateral facet hypertrophy accounting for the grade
1 anterolisthesis. Mild disc bulging. Mild foraminal narrowing, left
greater than right.

L4-5: Chronic degenerative disc disease with loss of disc height,
annular disc bulging and endplate osteophytes. Mild bilateral facet
hypertrophy. Mild foraminal narrowing bilaterally.

L5-S1. Chronic degenerative disc disease with loss of disc height
and endplate osteophytes. Mild bilateral facet hypertrophy. Mild
chronic foraminal narrowing bilaterally.
IMPRESSION: 1. Inferior endplate compression fracture at L1 is new from CT of 1
year ago and may be acute/subacute.
2. No other acute osseous findings.
3. Multilevel spondylosis, similar to previous CT. Associated
chronic foraminal narrowing inferiorly. No large disc herniation.

## 2022-12-06 ENCOUNTER — Other Ambulatory Visit: Payer: Self-pay | Admitting: Physician Assistant

## 2022-12-11 ENCOUNTER — Other Ambulatory Visit: Payer: Self-pay | Admitting: Physician Assistant

## 2022-12-12 ENCOUNTER — Telehealth: Payer: Self-pay | Admitting: Cardiovascular Disease

## 2022-12-12 ENCOUNTER — Encounter: Payer: Self-pay | Admitting: Cardiovascular Disease

## 2022-12-12 DIAGNOSIS — I4821 Permanent atrial fibrillation: Secondary | ICD-10-CM

## 2022-12-12 MED ORDER — APIXABAN 5 MG PO TABS
5.0000 mg | ORAL_TABLET | Freq: Two times a day (BID) | ORAL | 0 refills | Status: DC
Start: 2022-12-12 — End: 2023-03-13

## 2022-12-12 NOTE — Telephone Encounter (Signed)
*  STAT* If patient is at the pharmacy, call can be transferred to refill team.   1. Which medications need to be refilled? (please list name of each medication and dose if known) apixaban (ELIQUIS) 5 MG TABS tablet    2. Would you like to learn more about the convenience, safety, & potential cost savings by using the [facility]? No     3. Are you open to using the [facility] (Type [facility]. No ).   4. Which pharmacy/location (including street and city if local pharmacy) is medication to be sent to? [id number] - [place], [place] - [address]    5. Do they need a 30 day or 90 day supply? 90

## 2022-12-12 NOTE — Telephone Encounter (Addendum)
Eliquis 5mg  refill request received. Patient is 77 years old, weight-69.4kg, Crea-0.91 on 06/11/21-Needs labs, Diagnosis-Afib, and last seen by Dr. Elease Hashimoto on 03/01/22 & has an appt December 2024. Dose is appropriate based on dosing criteria.   Pt needs updated labs since last drawn on 06/11/21  Spoke with pt and updated her on the need for labs and the reasoning. Ordered labs and she will come to [facility] office for the labs and is aware will send a refill in at this time.

## 2022-12-14 NOTE — Telephone Encounter (Signed)
Refill sent by Annie Main, RN and pt is coming to get labs on Friday, 12/16/22.

## 2022-12-16 DIAGNOSIS — I4821 Permanent atrial fibrillation: Secondary | ICD-10-CM | POA: Diagnosis not present

## 2022-12-17 LAB — CBC
Hematocrit: 40.5 % (ref 34.0–46.6)
Hemoglobin: 13 g/dL (ref 11.1–15.9)
MCH: 32.7 pg (ref 26.6–33.0)
MCHC: 32.1 g/dL (ref 31.5–35.7)
MCV: 102 fL — ABNORMAL HIGH (ref 79–97)
Platelets: 265 10*3/uL (ref 150–450)
RBC: 3.98 x10E6/uL (ref 3.77–5.28)
RDW: 11 % — ABNORMAL LOW (ref 11.7–15.4)
WBC: 8.4 10*3/uL (ref 3.4–10.8)

## 2022-12-17 LAB — BASIC METABOLIC PANEL
BUN/Creatinine Ratio: 29 — ABNORMAL HIGH (ref 12–28)
BUN: 26 mg/dL (ref 8–27)
CO2: 21 mmol/L (ref 20–29)
Calcium: 9.3 mg/dL (ref 8.7–10.3)
Chloride: 105 mmol/L (ref 96–106)
Creatinine, Ser: 0.9 mg/dL (ref 0.57–1.00)
Glucose: 86 mg/dL (ref 70–99)
Potassium: 5 mmol/L (ref 3.5–5.2)
Sodium: 139 mmol/L (ref 134–144)
eGFR: 67 mL/min/{1.73_m2} (ref >59)

## 2022-12-19 ENCOUNTER — Ambulatory Visit (INDEPENDENT_AMBULATORY_CARE_PROVIDER_SITE_OTHER): Payer: Medicare PPO

## 2022-12-19 DIAGNOSIS — Z23 Encounter for immunization: Secondary | ICD-10-CM

## 2023-02-07 DIAGNOSIS — L578 Other skin changes due to chronic exposure to nonionizing radiation: Secondary | ICD-10-CM | POA: Diagnosis not present

## 2023-02-26 ENCOUNTER — Encounter: Payer: Self-pay | Admitting: Cardiovascular Disease

## 2023-02-26 NOTE — Progress Notes (Unsigned)
Cardiology Office Note:    Date:  02/27/2023   ID:  [name], DOB 08-26-1945, MRN [id number]  PCP:  Kristian Covey, MD  Cardiologist:   Valencia Kassa Electrophysiologist:  None   Referring MD: Kristian Covey, MD   Chief Complaint  Patient presents with   Atrial Fibrillation    Sept. 15, 2020   [name] is a 77 y.o. female with a recent diagnosis of atrial fib with RVR . We were asked to see her today by Dr. Caryl Never for further evaluation of her atrial fib.   Cannot tell when she is in atrial fib or now She had AF with RVR  She was found to have AF RVR.    She found her rapid HR when she put on a pulse oxymeter and found her HR to be 160 .  Noticed palpitations when she was busy taking care of family who was visiting her from out of town.  Is also on Eliquis .  She is feeling better.   Is an ovarian cancer surviver.   Has neuropathy from chemo .   March 13, 2019:  [name] is seen today for follow-up visit for her atrial fibrillation.  She had a cardioversion on December 17, 2018. She feels much better after the cardioversion. She is gained a little bit of weight since we last saw her. Wt is 190  ( up 8 lbs from Sept, 2020)  Knows she needs to walk more   Feb. 28, 2022: [name] is seen today for follow up of her atrial fib  She was seen by Tereso Newcomer, PA in Aug. 2021. Wt today is 149 lbs ,  Feeling much better. Has had some ankle issues,  Cannot wak,  Bought a stationary bike  Has had some hematura.  Will likely need a cystoscopy .  She may hold her Eliquis for 2 days prior to cystoscopy   February 26, 2021: [name] is seen today for a follow-up visit.  She has a history of chronic atrial fibrillation.   Has moderate - severe MR We are watching closely    Is on eliquis   No cp, no dyspnea  Walks and does stationary bike regularly   She wants to cancel the echo scheduled for next week   Dec. 5, 2023  [name] is seen for follow up of her  atrial fib . Moderate - severe MR  Has continued to lose weight   157 lbs   Cannot tell if she is in atrial fib Saw Scott 3 months ago  Echo from Oct. 6 shows mildly reduced LV function   Dec. 2, 2024 Is doing well Exercises regularly  Wt is 149 lbs  Remains in atrial fib  Is on eliquis     Past Medical History:  Diagnosis Date   Anticoagulant long-term use    eliquis--- managed by cardiology   Arthritis    knees   Cancer (HCC)    Hematuria    History of cystitis 2007   follicular cystitis s/p cysto bx   History of kidney stones    History of ovarian cancer in adulthood released from oncologist-- dr Darnelle Catalan per lov note in epic 07-11-2011 in remission   dx 01/ 2007;  02/ 2007 s/p exp. lap w/ TAH/ BSO/ Appendectomy/ Omectomy/ Splenectomy/ tumor debulking;  Stage IIIC,  completed chemo 07/ 2007;     History of septic shock 2016   secondary to UTI and blood infection from dog bite, resolved  History of thrombosis    per pt had 2007 PAC occlusion due to blood clot, completed coumadin treatment, and PAC removed   History of vertebral fracture    L1 & 2   Lesion of bladder    Nonischemic cardiomyopathy    Echo 7/21: EF 40-45, global HK, normal RVSF, mild BAE, mild MR // Myoview 9/21: EF 52, no ischemia, low risk    Persistent atrial fibrillation [facility]) cardiologist-- dr Georgann Housekeeper   dx 08/ 2020 ;   S/p cardioversion 11/2018 //anticoagulation with Apixaban //Echo 11/2018: EF 45-50, LVH, GRII DD, mildly reduced RV SF, severe LAE   PONV (postoperative nausea and vomiting)    S/P splenectomy 05/03/2005   during surgery for extensive abdominal tumor debulking    Past Surgical History:  Procedure Laterality Date   BLEPHAROPLASTY Bilateral 07-04-2002  @[facility]    UPPER EYELIDS   CARDIOVERSION N/A 12/17/2018   Procedure: CARDIOVERSION;  Surgeon: Jodelle Red, MD;  Location: [facility] ENDOSCOPY;  Service: Cardiovascular;  Laterality: N/A;   CATARACT EXTRACTION W/ INTRAOCULAR LENS   IMPLANT, BILATERAL  2000 approx   CESAREAN SECTION  1982;  1976   CYSTOSCOPY WITH BIOPSY N/A 09/18/2020   Procedure: CYSTOSCOPY WITH BIOPSY FULGURATION;  Surgeon: Bjorn Pippin, MD;  Location: [facility];  Service: Urology;  Laterality: N/A;   EXPLORATORY LAPAROTOMY/  TOTAL ABDOMINAL HYSTERECTOMY/  BILATERAL SALPINGOOPHORECTOMY/  EXTENSIVE TUMOR DEBULKING/  APPENDECTOMY/  OMENTECTOMY/  SPLENECTOMY  05/03/2005   GASTRIC BYPASS  1979   intestinal bariatric    LAPAROSCOPIC CHOLECYSTECTOMY  10-19-2009 @ [facility]   PORT A CATH INSERTION AND REMOVAL  03/ 2007;  REMOVED 03/ 2008   TRANSURETHRAL RESECTION OF BLADDER  09-13-2005  @[facility]  dr Annabell Howells   LESION   VENTRAL HERNIA REPAIR  04-04-2005  @ [facility]    Current Medications: Current Meds  Medication Sig   acetaminophen (TYLENOL) 500 MG tablet Take 1,000 mg by mouth every 4 (four) hours as needed for moderate pain or mild pain.   apixaban (ELIQUIS) 5 MG TABS tablet Take 1 tablet (5 mg total) by mouth 2 (two) times daily.   metoprolol succinate (TOPROL-XL) 100 MG 24 hr tablet TAKE 1 TABLET EVERY DAY *TAKE WITH 50MG  TO EQUAL 150MG *   metoprolol succinate (TOPROL-XL) 50 MG 24 hr tablet TAKE 1 TABLET BY MOUTH DAILY. TAKE WITH OR IMMEDIATELY FOLLOWING A MEAL.   Multiple Vitamin (MULTIVITAMIN) tablet Take 1 tablet by mouth daily.    Ondansetron HCl (ZOFRAN PO) Take 1 tablet by mouth daily as needed (nausea/vomiting).   TOLAK 4 % CREA Apply topically daily.   zolpidem (AMBIEN) 10 MG tablet TAKE 1 TABLET BY MOUTH EVERY DAY AT BEDTIME AS NEEDED FOR SLEEP     Allergies:   Morphine and codeine, Penicillins, and Methocarbamol   Social History   Socioeconomic History   Marital status: Married    Spouse name: Not on file   Number of children: Not on file   Years of education: Not on file   Highest education level: Master's degree (e.g., MA, MS, MEng, MEd, MSW, MBA)  Occupational History   Not on file  Tobacco Use   Smoking status: Never    Smokeless tobacco: Never  Vaping Use   Vaping status: Never Used  Substance and Sexual Activity   Alcohol use: No   Drug use: No   Sexual activity: Yes    Partners: Male    Birth control/protection: Surgical    Comment: 1ST intercourse- 28, partners- 1, hysterectomy  Other  Topics Concern   Not on file  Social History Narrative   Not on file   Social Determinants of Health   Financial Resource Strain: Low Risk  (05/24/2021)   Overall Financial Resource Strain (CARDIA)    Difficulty of Paying Living Expenses: Not hard at all  Food Insecurity: No Food Insecurity (05/24/2021)   Hunger Vital Sign    Worried About Running Out of Food in the Last Year: Never true    Ran Out of Food in the Last Year: Never true  Transportation Needs: No Transportation Needs (05/24/2021)   PRAPARE - Administrator, Civil Service (Medical): No    Lack of Transportation (Non-Medical): No  Physical Activity: Sufficiently Active (05/24/2021)   Exercise Vital Sign    Days of Exercise per Week: 5 days    Minutes of Exercise per Session: 30 min  Stress: No Stress Concern Present (05/24/2021)   [employer] of Occupational Health - Occupational Stress Questionnaire    Feeling of Stress : Not at all  Social Connections: Unknown (05/24/2021)   Social Connection and Isolation Panel [NHANES]    Frequency of Communication with Friends and Family: More than three times a week    Frequency of Social Gatherings with Friends and Family: More than three times a week    Attends Religious Services: Patient declined    Database administrator or Organizations: Yes    Attends Banker Meetings: 1 to 4 times per year    Marital Status: Married     Family History: The patient's family history includes Arthritis in her mother and another family member; Cancer in her sister; Cancer (age of onset: 50) in her father; Diabetes in her sister.  ROS:   Please see the history of present illness.     All  other systems reviewed and are negative.  EKGs/Labs/Other Studies Reviewed:    The following studies were reviewed today:   EKG:    EKG Interpretation Date/Time:  Monday February 27 2023 10:20:02 EST Ventricular Rate:  108 PR Interval:    QRS Duration:  82 QT Interval:  338 QTC Calculation: 452 R Axis:   84  Text Interpretation: Atrial fibrillation with rapid ventricular response When compared with ECG of 17-Dec-2018 11:03, Atrial fibrillation has replaced Sinus rhythm Vent. rate has increased BY  46 BPM Confirmed by Kristeen Miss ([id number]) on 02/27/2023 10:28:26 AM    Recent Labs: 12/16/2022: BUN 26; Creatinine, Ser 0.90; Hemoglobin 13.0; Platelets 265; Potassium 5.0; Sodium 139  Recent Lipid Panel    Component Value Date/Time   CHOL 135 02/24/2021 1015   TRIG 87.0 02/24/2021 1015   HDL 56.30 02/24/2021 1015   CHOLHDL 2 02/24/2021 1015   VLDL 17.4 02/24/2021 1015   LDLCALC 61 02/24/2021 1015    Physical Exam:     Physical Exam: Blood pressure 102/70, pulse (!) 109, height 5' 0.25" (1.53 m), weight 149 lb 9.6 oz (67.9 kg), SpO2 98%.       GEN:  Well nourished, well developed in no acute distress HEENT: Normal NECK: No JVD; No carotid bruits LYMPHATICS: No lymphadenopathy CARDIAC: irreg. Irreg.  RESPIRATORY:  Clear to auscultation without rales, wheezing or rhonchi  ABDOMEN: Soft, non-tender, non-distended MUSCULOSKELETAL:  No edema; No deformity  SKIN: Warm and dry NEUROLOGIC:  Alert and oriented x 3      ASSESSMENT:    1. Permanent atrial fibrillation (HCC)   2. NICM (nonischemic cardiomyopathy) (HCC)       PLAN:  1.  Atrial fib:  remain in Afib.   HR is a bit fast this am  I've asked her to divide her Metoprolol XL and take 100 mg in the AM and 50 mg in the PPM.  This may help reduce her HR    2.  Obesity:   she has continued to lose weight .   3.  Mild systolic congestive heart failure:   EF is 40-45%.   Continue current meds.     Medication Adjustments/Labs and Tests Ordered: Current medicines are reviewed at length with the patient today.  Concerns regarding medicines are outlined above.  Orders Placed This Encounter  Procedures   EKG 12-Lead    No orders of the defined types were placed in this encounter.     Patient Instructions  Medication Instructions:  Your physician recommends that you continue on your current medications as directed. Please refer to the Current Medication list given to you today.  *If you need a refill on your cardiac medications before your next appointment, please call your pharmacy*  Lab Work: None ordered today.  Testing/Procedures: None ordered today.  Follow-Up: At [facility], you and your health needs are our priority.  As part of our continuing mission to provide you with exceptional heart care, we have created designated Provider Care Teams.  These Care Teams include your primary Cardiologist (physician) and Advanced Practice Providers (APPs -  Physician Assistants and Nurse Practitioners) who all work together to provide you with the care you need, when you need it.  Your next appointment:   1 year(s)  The format for your next appointment:   In Person  Provider:   Jodelle Red, MD   Signed, Kristeen Miss, MD  02/27/2023 11:27 AM    [facility]

## 2023-02-27 ENCOUNTER — Ambulatory Visit: Payer: Medicare PPO | Attending: Cardiovascular Disease | Admitting: Cardiovascular Disease

## 2023-02-27 ENCOUNTER — Encounter: Payer: Self-pay | Admitting: Cardiovascular Disease

## 2023-02-27 VITALS — BP 102/70 | HR 109 | Ht 60.25 in | Wt 149.6 lb

## 2023-02-27 DIAGNOSIS — I4821 Permanent atrial fibrillation: Secondary | ICD-10-CM

## 2023-02-27 DIAGNOSIS — I428 Other cardiomyopathies: Secondary | ICD-10-CM

## 2023-02-27 NOTE — Patient Instructions (Addendum)
Medication Instructions:  Your physician recommends that you continue on your current medications as directed. Please refer to the Current Medication list given to you today.  *If you need a refill on your cardiac medications before your next appointment, please call your pharmacy*  Lab Work: None ordered today.  Testing/Procedures: None ordered today.  Follow-Up: At [facility], you and your health needs are our priority.  As part of our continuing mission to provide you with exceptional heart care, we have created designated Provider Care Teams.  These Care Teams include your primary Cardiologist (physician) and Advanced Practice Providers (APPs -  Physician Assistants and Nurse Practitioners) who all work together to provide you with the care you need, when you need it.  Your next appointment:   1 year(s)  The format for your next appointment:   In Person  Provider:   Jodelle Red, MD

## 2023-03-07 ENCOUNTER — Other Ambulatory Visit: Payer: Self-pay | Admitting: Cardiovascular Disease

## 2023-03-09 DIAGNOSIS — L2989 Other pruritus: Secondary | ICD-10-CM | POA: Diagnosis not present

## 2023-03-09 DIAGNOSIS — L578 Other skin changes due to chronic exposure to nonionizing radiation: Secondary | ICD-10-CM | POA: Diagnosis not present

## 2023-03-09 DIAGNOSIS — L570 Actinic keratosis: Secondary | ICD-10-CM | POA: Diagnosis not present

## 2023-03-11 ENCOUNTER — Other Ambulatory Visit: Payer: Self-pay | Admitting: Physician Assistant

## 2023-03-11 ENCOUNTER — Other Ambulatory Visit: Payer: Self-pay | Admitting: Cardiovascular Disease

## 2023-03-11 DIAGNOSIS — I4821 Permanent atrial fibrillation: Secondary | ICD-10-CM

## 2023-03-13 NOTE — Telephone Encounter (Signed)
Prescription refill request for Eliquis received. Indication:afib Last office visit:12/24 Scr:0.90  9/24 Age: 77 Weight:67.9  kg  Prescription refilled

## 2023-03-16 ENCOUNTER — Encounter: Payer: Self-pay | Admitting: Family Medicine

## 2023-05-23 DIAGNOSIS — M25562 Pain in left knee: Secondary | ICD-10-CM | POA: Diagnosis not present

## 2023-06-02 ENCOUNTER — Ambulatory Visit (HOSPITAL_COMMUNITY)
Admission: EM | Admit: 2023-06-02 | Discharge: 2023-06-02 | Disposition: A | Discharge status: Home / Self Care | Attending: Family Medicine | Admitting: Family Medicine

## 2023-06-02 ENCOUNTER — Encounter (HOSPITAL_COMMUNITY): Payer: Self-pay | Admitting: Emergency Medicine

## 2023-06-02 DIAGNOSIS — U071 COVID-19: Secondary | ICD-10-CM | POA: Diagnosis not present

## 2023-06-02 LAB — POC COVID19/FLU A&B COMBO
Covid Antigen, POC: POSITIVE — AB
Influenza A Antigen, POC: NEGATIVE
Influenza B Antigen, POC: NEGATIVE

## 2023-06-02 MED ORDER — MOLNUPIRAVIR EUA 200MG CAPSULE: 4.0000 | ORAL_CAPSULE | Freq: Two times a day (BID) | ORAL | 0 refills | Status: AC

## 2023-06-02 MED ORDER — PROMETHAZINE-DM 6.25-15 MG/5ML PO SYRP: 5.0000 mL | ORAL_SOLUTION | Freq: Four times a day (QID) | ORAL | 0 refills | Status: DC | PRN

## 2023-06-02 NOTE — ED Provider Notes (Signed)
 [facility]    CSN: [id number] Arrival date & time: 06/02/23  1191      History   Chief Complaint Chief Complaint  Patient presents with   Cough    HPI BRITHANY WHITWORTH is a 78 y.o. female.    Cough Associated symptoms: rhinorrhea and sore throat   Associated symptoms: no fever    Patient is here for URI symptoms.  She is having a cough, sinus congestion and headache since yesterday.   No fevers/chills.  Mild sob with severe coughing.  Took a claritin yesterday.  No known sick contacts.       Past Medical History:  Diagnosis Date   Anticoagulant long-term use    eliquis--- managed by cardiology   Arthritis    knees   Cancer (HCC)    Hematuria    History of cystitis 2007   follicular cystitis s/p cysto bx   History of kidney stones    History of ovarian cancer in adulthood released from oncologist-- dr Darnelle Catalan per lov note in epic 07-11-2011 in remission   dx 01/ 2007;  02/ 2007 s/p exp. lap w/ TAH/ BSO/ Appendectomy/ Omectomy/ Splenectomy/ tumor debulking;  Stage IIIC,  completed chemo 07/ 2007;     History of septic shock 2016   secondary to UTI and blood infection from dog bite, resolved   History of thrombosis    per pt had 2007 PAC occlusion due to blood clot, completed coumadin treatment, and PAC removed   History of vertebral fracture    L1 & 2   Lesion of bladder    Nonischemic cardiomyopathy    Echo 7/21: EF 40-45, global HK, normal RVSF, mild BAE, mild MR // Myoview 9/21: EF 52, no ischemia, low risk    Persistent atrial fibrillation [facility]) cardiologist-- dr naser   dx 08/ 2020 ;   S/p cardioversion 11/2018 //anticoagulation with Apixaban //Echo 11/2018: EF 45-50, LVH, GRII DD, mildly reduced RV SF, severe LAE   PONV (postoperative nausea and vomiting)    S/P splenectomy 05/03/2005   during surgery for extensive abdominal tumor debulking    Patient Active Problem List   Diagnosis Date Noted   Nonischemic cardiomyopathy    Permanent  atrial fibrillation (HCC)    Dog bite 04/17/2014   Septic shock (HCC) 04/08/2014   Urinary tract infection 04/08/2014   Severe sepsis with septic shock (HCC) 04/08/2014   Lower urinary tract infectious disease 04/08/2014   Lactic acidosis 04/08/2014   Hx of splenomegaly 03/27/2013   Kidney stones 01/09/2013   Osteoporosis 01/09/2013   Obesity (BMI 30-39.9) 01/09/2013   Chronic insomnia 12/29/2011   CERVICALGIA 05/19/2009   Headache 05/19/2009   SPRAIN AND STRAIN OF UNSPECIFIED SITE OF HAND 05/19/2009   VIRAL URI 03/06/2009   WEIGHT GAIN 06/30/2008   NEOPLASM, MALIGNANT, OVARY, HX OF 06/30/2008    Past Surgical History:  Procedure Laterality Date   BLEPHAROPLASTY Bilateral 07-04-2002  @[facility]    UPPER EYELIDS   CARDIOVERSION N/A 12/17/2018   Procedure: CARDIOVERSION;  Surgeon: Jodelle Red, MD;  Location: [facility] ENDOSCOPY;  Service: Cardiovascular;  Laterality: N/A;   CATARACT EXTRACTION W/ INTRAOCULAR LENS  IMPLANT, BILATERAL  2000 approx   CESAREAN SECTION  1982;  1976   CYSTOSCOPY WITH BIOPSY N/A 09/18/2020   Procedure: CYSTOSCOPY WITH BIOPSY FULGURATION;  Surgeon: Bjorn Pippin, MD;  Location: [facility];  Service: Urology;  Laterality: N/A;   EXPLORATORY LAPAROTOMY/  TOTAL ABDOMINAL HYSTERECTOMY/  BILATERAL SALPINGOOPHORECTOMY/  EXTENSIVE TUMOR DEBULKING/  APPENDECTOMY/  OMENTECTOMY/  SPLENECTOMY  05/03/2005   GASTRIC BYPASS  1979   intestinal bariatric    LAPAROSCOPIC CHOLECYSTECTOMY  10-19-2009 @ [facility]   PORT A CATH INSERTION AND REMOVAL  03/ 2007;  REMOVED 03/ 2008   TRANSURETHRAL RESECTION OF BLADDER  09-13-2005  @[facility]  dr Annabell Howells   LESION   VENTRAL HERNIA REPAIR  04-04-2005  @ [facility]    OB History     Gravida  3   Para  2   Term  2   Preterm      AB  1   Living  2      SAB  1   IAB      Ectopic      Multiple      Live Births  2            Home Medications    Prior to Admission medications   Medication Sig Start Date End Date  Taking? Authorizing Provider  acetaminophen (TYLENOL) 500 MG tablet Take 1,000 mg by mouth every 4 (four) hours as needed for moderate pain or mild pain.    [provider]  ELIQUIS 5 MG TABS tablet TAKE 1 TABLET BY MOUTH TWICE A DAY 03/13/23   Tereso Newcomer T, PA-C  metoprolol succinate (TOPROL-XL) 100 MG 24 hr tablet TAKE 1 TABLET EVERY DAY *TAKE WITH 50MG  TO EQUAL 150MG * 03/13/23   [name], Deloris Ping, MD  metoprolol succinate (TOPROL-XL) 50 MG 24 hr tablet TAKE 1 TABLET BY MOUTH EVERY DAY WITH OR IMMEDIATELY FOLLOWING A MEAL 03/08/23   [name], Deloris Ping, MD  Multiple Vitamin (MULTIVITAMIN) tablet Take 1 tablet by mouth daily.     [provider]  Ondansetron HCl (ZOFRAN PO) Take 1 tablet by mouth daily as needed (nausea/vomiting).    [provider]  TOLAK 4 % CREA Apply topically daily. 02/07/23   [provider]  zolpidem (AMBIEN) 10 MG tablet TAKE 1 TABLET BY MOUTH EVERY DAY AT BEDTIME AS NEEDED FOR SLEEP 10/26/22   Burchette, Elberta Fortis, MD    Family History Family History  Problem Relation Age of Onset   Arthritis Mother        ?osteoarthritis   Cancer Father 27       lymphoma   Arthritis Other    Cancer Sister        ovaian cancer   Diabetes Sister        type 2     Social History Social History   Tobacco Use   Smoking status: Never   Smokeless tobacco: Never  Vaping Use   Vaping status: Never Used  Substance Use Topics   Alcohol use: No   Drug use: No     Allergies   Morphine and codeine, Penicillins, and Methocarbamol   Review of Systems Review of Systems  Constitutional:  Negative for fever.  HENT:  Positive for congestion, rhinorrhea and sore throat.   Respiratory:  Positive for cough.   Cardiovascular: Negative.   Gastrointestinal: Negative.   Genitourinary: Negative.   Musculoskeletal: Negative.   Psychiatric/Behavioral: Negative.       Physical Exam Triage Vital Signs ED Triage Vitals  Encounter Vitals Group      BP 06/02/23 1022 116/76     Systolic BP Percentile --      Diastolic BP Percentile --      Pulse Rate 06/02/23 1022 95     Resp 06/02/23 1022 18     Temp 06/02/23 1022 97.7 F (  36.5 C)     Temp Source 06/02/23 1022 Oral     SpO2 06/02/23 1022 95 %     Weight --      Height --      Head Circumference --      Peak Flow --      Pain Score 06/02/23 1020 5     Pain Loc --      Pain Education --      Exclude from Growth Chart --    No data found.  Updated Vital Signs BP 116/76 (BP Location: Right Arm)   Pulse 95   Temp 97.7 F (36.5 C) (Oral)   Resp 18   SpO2 95%   Visual Acuity Right Eye Distance:   Left Eye Distance:   Bilateral Distance:    Right Eye Near:   Left Eye Near:    Bilateral Near:     Physical Exam Constitutional:      General: She is not in acute distress.    Appearance: Normal appearance. She is normal weight. She is not ill-appearing.  HENT:     Nose: Congestion and rhinorrhea present.     Mouth/Throat:     Mouth: Mucous membranes are moist.     Pharynx: Posterior oropharyngeal erythema present.  Cardiovascular:     Rate and Rhythm: Normal rate and regular rhythm.  Pulmonary:     Breath sounds: Normal breath sounds.  Musculoskeletal:     Cervical back: Normal range of motion and neck supple. No tenderness.  Lymphadenopathy:     Cervical: No cervical adenopathy.  Skin:    General: Skin is warm.  Neurological:     General: No focal deficit present.     Mental Status: She is alert.  Psychiatric:        Mood and Affect: Mood normal.      UC Treatments / Results  Labs (all labs ordered are listed, but only abnormal results are displayed) Labs Reviewed  POC COVID19/FLU A&B COMBO - Abnormal; Notable for the following components:      Result Value   Covid Antigen, POC Positive (*)    All other components within normal limits    EKG   Radiology No results found.  Procedures Procedures (including critical care time)  Medications  Ordered in UC Medications - No data to display  Initial Impression / Assessment and Plan / UC Course  I have reviewed the triage vital signs and the nursing notes.  Pertinent labs & imaging results that were available during my care of the patient were reviewed by me and considered in my medical decision making (see chart for details).   Final Clinical Impressions(s) / UC Diagnoses   Final diagnoses:  COVID-19 virus infection     Discharge Instructions      You were diagnosed with covid19 infection today.  I have sent out an antiviral to treat this, as well as a cough syrup.  You may use over the counter tylenol for body aches and headaches.  Please return if you are not improving, or starting to feel worse despite medication.     ED Prescriptions     Medication Sig Dispense Auth. Provider   molnupiravir EUA (LAGEVRIO) 200 mg CAPS capsule Take 4 capsules (800 mg total) by mouth 2 (two) times daily for 5 days. 40 capsule Ashleymarie Granderson, MD   promethazine-dextromethorphan (PROMETHAZINE-DM) 6.25-15 MG/5ML syrup Take 5 mLs by mouth 4 (four) times daily as needed for cough. 118 mL Esti Demello, [place],  MD      PDMP not reviewed this encounter.   Jannifer Franklin, MD 06/02/23 1101

## 2023-06-02 NOTE — ED Triage Notes (Signed)
 Pt c/o cough, congestion, eyes watering, sore throat and headaches since yesterday. Took Claritin.

## 2023-06-02 NOTE — Discharge Instructions (Signed)
 You were diagnosed with covid19 infection today.  I have sent out an antiviral to treat this, as well as a cough syrup.  You may use over the counter tylenol for body aches and headaches.  Please return if you are not improving, or starting to feel worse despite medication.

## 2023-06-12 DIAGNOSIS — M25562 Pain in left knee: Secondary | ICD-10-CM | POA: Diagnosis not present

## 2023-06-18 ENCOUNTER — Other Ambulatory Visit: Payer: Self-pay | Admitting: Family Medicine

## 2023-06-21 DIAGNOSIS — M25562 Pain in left knee: Secondary | ICD-10-CM | POA: Diagnosis not present

## 2023-06-21 DIAGNOSIS — M1712 Unilateral primary osteoarthritis, left knee: Secondary | ICD-10-CM | POA: Diagnosis not present

## 2023-06-23 ENCOUNTER — Encounter: Payer: Self-pay | Admitting: Family Medicine

## 2023-06-23 ENCOUNTER — Ambulatory Visit: Admitting: Family Medicine

## 2023-06-23 VITALS — BP 110/70 | HR 97 | Temp 98.0°F | Wt 161.0 lb

## 2023-06-23 DIAGNOSIS — F5104 Psychophysiologic insomnia: Secondary | ICD-10-CM

## 2023-06-23 MED ORDER — ZOLPIDEM TARTRATE 10 MG PO TABS: 10.0000 mg | ORAL_TABLET | Freq: Every evening | ORAL | 1 refills | Status: DC | PRN

## 2023-06-23 MED ORDER — ONDANSETRON 4 MG PO TBDP: 4.0000 mg | ORAL_TABLET | Freq: Three times a day (TID) | ORAL | 1 refills | Status: AC | PRN

## 2023-06-23 NOTE — Progress Notes (Signed)
 Established Patient Office Visit  Subjective   Patient ID: [name], female    DOB: [birth year]  Age: 78 y.o. MRN: [id number]  Chief Complaint  Patient presents with   Medical Management of Chronic Issues    HPI   [name] is here for follow-up regarding chronic insomnia.  She has been on Ambien for several years.  She actually takes usually either half or three quarters of a tablet at night.  She falls asleep and frequently has awakening through the night.  She tried other medications previously without success. No late the use of caffeine or regular alcohol.  She is generally doing well.  She had COVID several weeks ago but feels recovered from that.  She has atrial fibrillation and is maintained on Eliquis and metoprolol.  Followed by cardiology.  No recent chest pains or dizziness. She does have past history of ovarian cancer.  She gets regular mammograms.  Past Medical History:  Diagnosis Date   Anticoagulant long-term use    eliquis--- managed by cardiology   Arthritis    knees   Cancer (HCC)    Hematuria    History of cystitis 2007   follicular cystitis s/p cysto bx   History of kidney stones    History of ovarian cancer in adulthood released from oncologist-- dr Darnelle Catalan per lov note in epic 07-11-2011 in remission   dx 01/ 2007;  02/ 2007 s/p exp. lap w/ TAH/ BSO/ Appendectomy/ Omectomy/ Splenectomy/ tumor debulking;  Stage IIIC,  completed chemo 07/ 2007;     History of septic shock 2016   secondary to UTI and blood infection from dog bite, resolved   History of thrombosis    per pt had 2007 PAC occlusion due to blood clot, completed coumadin treatment, and PAC removed   History of vertebral fracture    L1 & 2   Lesion of bladder    Nonischemic cardiomyopathy    Echo 7/21: EF 40-45, global HK, normal RVSF, mild BAE, mild MR // Myoview 9/21: EF 52, no ischemia, low risk    Persistent atrial fibrillation [facility]) cardiologist-- dr Georgann Housekeeper   dx 08/ 2020 ;    S/p cardioversion 11/2018 //anticoagulation with Apixaban //Echo 11/2018: EF 45-50, LVH, GRII DD, mildly reduced RV SF, severe LAE   PONV (postoperative nausea and vomiting)    S/P splenectomy 05/03/2005   during surgery for extensive abdominal tumor debulking   Past Surgical History:  Procedure Laterality Date   BLEPHAROPLASTY Bilateral 07-04-2002  @[facility]    UPPER EYELIDS   CARDIOVERSION N/A 12/17/2018   Procedure: CARDIOVERSION;  Surgeon: Jodelle Red, MD;  Location: [facility] ENDOSCOPY;  Service: Cardiovascular;  Laterality: N/A;   CATARACT EXTRACTION W/ INTRAOCULAR LENS  IMPLANT, BILATERAL  2000 approx   CESAREAN SECTION  1982;  1976   CYSTOSCOPY WITH BIOPSY N/A 09/18/2020   Procedure: CYSTOSCOPY WITH BIOPSY FULGURATION;  Surgeon: Bjorn Pippin, MD;  [facility];  Service: Urology;  Laterality: N/A;   EXPLORATORY LAPAROTOMY/  TOTAL ABDOMINAL HYSTERECTOMY/  BILATERAL SALPINGOOPHORECTOMY/  EXTENSIVE TUMOR DEBULKING/  APPENDECTOMY/  OMENTECTOMY/  SPLENECTOMY  05/03/2005   GASTRIC BYPASS  1979   intestinal bariatric    LAPAROSCOPIC CHOLECYSTECTOMY  10-19-2009 @ [facility]   PORT A CATH INSERTION AND REMOVAL  03/ 2007;  REMOVED 03/ 2008   TRANSURETHRAL RESECTION OF BLADDER  09-13-2005  @[facility]  dr Annabell Howells   LESION   VENTRAL HERNIA REPAIR  04-04-2005  @ [facility]    reports that she has  never smoked. She has never used smokeless tobacco. She reports that she does not drink alcohol and does not use drugs. family history includes Arthritis in her mother and another family member; Cancer in her sister; Cancer (age of onset: 41) in her father; Diabetes in her sister. Allergies  Allergen Reactions   Morphine And Codeine Nausea And Vomiting   Penicillins Hives    Did it involve swelling of the face/tongue/throat, SOB, or low BP? No Did it involve sudden or severe rash/hives, skin peeling, or any reaction on the inside of your mouth or nose? No Did you need to seek medical attention at a  hospital or doctor's office? Unknown When did it last happen?      50 + years If all above answers are "NO", may proceed with cephalosporin use.    Methocarbamol Other (See Comments)    Stomach upset    Review of Systems  Constitutional:  Negative for malaise/fatigue.  Eyes:  Negative for blurred vision.  Respiratory:  Negative for shortness of breath.   Cardiovascular:  Negative for chest pain.  Neurological:  Negative for dizziness, weakness and headaches.  Psychiatric/Behavioral:  The patient has insomnia.       Objective:     BP 110/70 (BP Location: Left Arm, Patient Position: Sitting, Cuff Size: Normal)   Pulse 97   Temp 98 F (36.7 C) (Oral)   Wt 161 lb (73 kg)   SpO2 96%   BMI 31.18 kg/m  BP Readings from Last 3 Encounters:  06/23/23 110/70  06/02/23 116/76  02/27/23 102/70   Wt Readings from Last 3 Encounters:  06/23/23 161 lb (73 kg)  02/27/23 149 lb 9.6 oz (67.9 kg)  08/04/22 153 lb (69.4 kg)      Physical Exam Vitals reviewed.  Constitutional:      General: She is not in acute distress. Cardiovascular:     Rate and Rhythm: Normal rate.     Comments: Irregularly irregular rhythm Pulmonary:     Effort: Pulmonary effort is normal.     Breath sounds: Normal breath sounds. No wheezing or rales.  Musculoskeletal:     Right lower leg: No edema.     Left lower leg: No edema.  Neurological:     Mental Status: She is alert.      No results found for any visits on 06/23/23.    The 10-year ASCVD risk score (Arnett DK, et al., 2019) is: 11.8%    Assessment & Plan:   #1 chronic insomnia.  Patient has been on Ambien for several years.  She has tried other medications previously without success.  We discussed sleep hygiene multiple times in the past and she is aware of good principles of sleep hygiene.  Refill for 6 months  #2 chronic atrial fibrillation.  Maintained on Eliquis and metoprolol.  Appears to be in A-fib today.  She is asymptomatic with  regard to her A-fib.  Continue close follow-up with cardiology.  Christina Peat, MD

## 2023-07-05 DIAGNOSIS — M25562 Pain in left knee: Secondary | ICD-10-CM | POA: Diagnosis not present

## 2023-07-10 DIAGNOSIS — M25562 Pain in left knee: Secondary | ICD-10-CM | POA: Diagnosis not present

## 2023-07-25 DIAGNOSIS — M25562 Pain in left knee: Secondary | ICD-10-CM | POA: Diagnosis not present

## 2023-08-04 DIAGNOSIS — M25562 Pain in left knee: Secondary | ICD-10-CM | POA: Diagnosis not present

## 2023-08-04 DIAGNOSIS — M1712 Unilateral primary osteoarthritis, left knee: Secondary | ICD-10-CM | POA: Diagnosis not present

## 2023-08-30 DIAGNOSIS — H00015 Hordeolum externum left lower eyelid: Secondary | ICD-10-CM | POA: Diagnosis not present

## 2023-08-31 DIAGNOSIS — L570 Actinic keratosis: Secondary | ICD-10-CM | POA: Diagnosis not present

## 2023-08-31 DIAGNOSIS — R233 Spontaneous ecchymoses: Secondary | ICD-10-CM | POA: Diagnosis not present

## 2023-08-31 DIAGNOSIS — L821 Other seborrheic keratosis: Secondary | ICD-10-CM | POA: Diagnosis not present

## 2023-08-31 DIAGNOSIS — D225 Melanocytic nevi of trunk: Secondary | ICD-10-CM | POA: Diagnosis not present

## 2023-08-31 DIAGNOSIS — L814 Other melanin hyperpigmentation: Secondary | ICD-10-CM | POA: Diagnosis not present

## 2023-09-26 ENCOUNTER — Telehealth: Payer: Self-pay | Admitting: Cardiovascular Disease

## 2023-09-26 MED ORDER — APIXABAN 5 MG PO TABS: 5.0000 mg | ORAL_TABLET | Freq: Two times a day (BID) | ORAL | 0 refills | Status: DC

## 2023-09-26 NOTE — Telephone Encounter (Signed)
 Pt c/o medication issue:  1. Name of Medication:  ELIQUIS  5 MG TABS tablet   2. How are you currently taking this medication (dosage and times per day)?   3. Are you having a reaction (difficulty breathing--STAT)?   4. What is your medication issue?   Patient says she is on vacation right now and she will be short 2 Eliquis  tablets. She would like to know if she can cut the tablets in half or what he needs to do. Please advise.

## 2023-09-26 NOTE — Telephone Encounter (Signed)
 Spoke with pt who states she is on vacation and will be short her Eliquis  doses x 1 day.  Pt advised will send Eliquis  5mg  - 2 tablets to [facility] as requested.  Pt advised she may have to pay out of pocket for medication since she just filled Rx prior to leaving for [facility].  Pt states she will be fine paying for medicine if that is the case.

## 2023-09-27 ENCOUNTER — Encounter: Admitting: Obstetrics and Gynecology

## 2023-10-05 ENCOUNTER — Ambulatory Visit: Admitting: Adult Health

## 2023-10-05 ENCOUNTER — Ambulatory Visit: Payer: Self-pay

## 2023-10-05 ENCOUNTER — Encounter: Payer: Self-pay | Admitting: Adult Health

## 2023-10-05 ENCOUNTER — Ambulatory Visit

## 2023-10-05 VITALS — BP 110/70 | HR 92 | Temp 97.7°F | Ht 60.25 in | Wt 162.0 lb

## 2023-10-05 DIAGNOSIS — M545 Low back pain, unspecified: Secondary | ICD-10-CM

## 2023-10-05 DIAGNOSIS — M5126 Other intervertebral disc displacement, lumbar region: Secondary | ICD-10-CM | POA: Diagnosis not present

## 2023-10-05 DIAGNOSIS — M4316 Spondylolisthesis, lumbar region: Secondary | ICD-10-CM | POA: Diagnosis not present

## 2023-10-05 DIAGNOSIS — Z043 Encounter for examination and observation following other accident: Secondary | ICD-10-CM | POA: Diagnosis not present

## 2023-10-05 DIAGNOSIS — M5136 Other intervertebral disc degeneration, lumbar region with discogenic back pain only: Secondary | ICD-10-CM | POA: Diagnosis not present

## 2023-10-05 MED ORDER — CYCLOBENZAPRINE HCL 5 MG PO TABS: 5.0000 mg | ORAL_TABLET | Freq: Three times a day (TID) | ORAL | 1 refills | Status: AC | PRN

## 2023-10-05 MED ORDER — METHYLPREDNISOLONE 4 MG PO TBPK: ORAL_TABLET | ORAL | 0 refills | Status: AC
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
## 2023-10-05 NOTE — Progress Notes (Signed)
 Subjective:    Patient ID: [name], female    DOB: July 09, 1945, 78 y.o.   MRN: [id number]  HPI  78 year old female who  has a past medical history of Anticoagulant long-term use, Arthritis, Cancer (HCC), Hematuria, History of cystitis (2007), History of kidney stones, History of ovarian cancer in adulthood (released from oncologist-- dr layla per lov note in epic 07-11-2011 in remission), History of septic shock (2016), History of thrombosis, History of vertebral fracture, Lesion of bladder, Nonischemic cardiomyopathy, Persistent atrial fibrillation [facility]) (cardiologist-- dr burr), PONV (postoperative nausea and vomiting), and S/P splenectomy (05/03/2005).  She is a patient of Dr. Micheal who I am seeing today for an acute visit.   About 4 to 5 days ago [place] was wearing [place]-flops and got tripped up on the edge of a throw rug.  She fell forward onto a large couch she broke her fall.  She denies falling onto the ground or hitting her head.  Since this she has had significant low back pain, back pain is more intense with changing positions, bending, twisting, or walking upstairs.  She does have a history of compression fracture at L1.  At home she has been taking Tylenol  without much relief.  She does feel as though it may be improving slightly as today is the first day she has been able to cross her legs.    Review of Systems See HPI   Past Medical History:  Diagnosis Date   Anticoagulant long-term use    eliquis --- managed by cardiology   Arthritis    knees   Cancer (HCC)    Hematuria    History of cystitis 2007   follicular cystitis s/p cysto bx   History of kidney stones    History of ovarian cancer in adulthood released from oncologist-- dr layla per lov note in epic 07-11-2011 in remission   dx 01/ 2007;  02/ 2007 s/p exp. lap w/ TAH/ BSO/ Appendectomy/ Omectomy/ Splenectomy/ tumor debulking;  Stage IIIC,  completed chemo  07/ 2007;     History of septic shock 2016   secondary to UTI and blood infection from dog bite, resolved   History of thrombosis    per pt had 2007 PAC occlusion due to blood clot, completed coumadin treatment, and PAC removed   History of vertebral fracture    L1 & 2   Lesion of bladder    Nonischemic cardiomyopathy    Echo 7/21: EF 40-45, global HK, normal RVSF, mild BAE, mild MR // Myoview  9/21: EF 52, no ischemia, low risk    Persistent atrial fibrillation [facility]) cardiologist-- dr burr   dx 08/ 2020 ;   S/p cardioversion 11/2018 //anticoagulation with Apixaban  //Echo 11/2018: EF 45-50, LVH, GRII DD, mildly reduced RV SF, severe LAE   PONV (postoperative nausea and vomiting)    S/P splenectomy 05/03/2005   during surgery for extensive abdominal tumor debulking    Social History   Socioeconomic History   Marital status: Married    Spouse name: Not on file   Number of children: Not on file   Years of education: Not on file   Highest education level: Master's degree (e.g., MA, MS, MEng, MEd, MSW, MBA)  Occupational History   Not on file  Tobacco Use   Smoking status: Never   Smokeless tobacco: Never  Vaping Use   Vaping status: Never Used  Substance and Sexual Activity   Alcohol use: No  Drug use: No   Sexual activity: Yes    Partners: Male    Birth control/protection: Surgical    Comment: 1ST intercourse- 28, partners- 1, hysterectomy  Other Topics Concern   Not on file  Social History Narrative   Not on file   Social Drivers of Health   Financial Resource Strain: Low Risk  (06/19/2023)   Overall Financial Resource Strain (CARDIA)    Difficulty of Paying Living Expenses: Not hard at all  Food Insecurity: No Food Insecurity (06/19/2023)   Hunger Vital Sign    Worried About Running Out of Food in the Last Year: Never true    Ran Out of Food in the Last Year: Never true  Transportation Needs: No Transportation Needs (06/19/2023)   PRAPARE - Scientist, research (physical sciences) (Medical): No    Lack of Transportation (Non-Medical): No  Physical Activity: Unknown (06/19/2023)   Exercise Vital Sign    Days of Exercise per Week: 0 days    Minutes of Exercise per Session: Not on file  Stress: No Stress Concern Present (06/19/2023)   [employer] of Occupational Health - Occupational Stress Questionnaire    Feeling of Stress : Not at all  Social Connections: Socially Integrated (06/19/2023)   Social Connection and Isolation Panel    Frequency of Communication with Friends and Family: More than three times a week    Frequency of Social Gatherings with Friends and Family: More than three times a week    Attends Religious Services: More than 4 times per year    Active Member of Clubs or Organizations: Yes    Attends Banker Meetings: More than 4 times per year    Marital Status: Married  Catering manager Violence: Not on file    Past Surgical History:  Procedure Laterality Date   BLEPHAROPLASTY Bilateral 07-04-2002  @[facility]    UPPER EYELIDS   CARDIOVERSION N/A 12/17/2018   Procedure: CARDIOVERSION;  Surgeon: Lonni Slain, MD;  Location: [facility] ENDOSCOPY;  Service: Cardiovascular;  Laterality: N/A;   CATARACT EXTRACTION W/ INTRAOCULAR LENS  IMPLANT, BILATERAL  2000 approx   CESAREAN SECTION  1982;  1976   CYSTOSCOPY WITH BIOPSY N/A 09/18/2020   Procedure: CYSTOSCOPY WITH BIOPSY FULGURATION;  Surgeon: Watt Rush, MD;  Location: [facility];  Service: Urology;  Laterality: N/A;   EXPLORATORY LAPAROTOMY/  TOTAL ABDOMINAL HYSTERECTOMY/  BILATERAL SALPINGOOPHORECTOMY/  EXTENSIVE TUMOR DEBULKING/  APPENDECTOMY/  OMENTECTOMY/  SPLENECTOMY  05/03/2005   GASTRIC BYPASS  1979   intestinal bariatric    LAPAROSCOPIC CHOLECYSTECTOMY  10-19-2009 @ [facility]   PORT A CATH INSERTION AND REMOVAL  03/ 2007;  REMOVED 03/ 2008   TRANSURETHRAL RESECTION OF BLADDER  09-13-2005  @[facility]  dr watt   LESION   VENTRAL HERNIA REPAIR  04-04-2005  @ [facility]     Family History  Problem Relation Age of Onset   Arthritis Mother        ?osteoarthritis   Cancer Father 66       lymphoma   Arthritis Other    Cancer Sister        ovaian cancer   Diabetes Sister        type 2     Allergies  Allergen Reactions   Morphine And Codeine Nausea And Vomiting   Penicillins Hives    Did it involve swelling of the face/tongue/throat, SOB, or low BP? No Did it involve sudden or severe rash/hives, skin peeling, or any reaction on the inside  of your mouth or nose? No Did you need to seek medical attention at a hospital or doctor's office? Unknown When did it last happen?      50 + years If all above answers are NO, may proceed with cephalosporin use.    Methocarbamol  Other (See Comments)    Stomach upset    Current Outpatient Medications on File Prior to Visit  Medication Sig Dispense Refill   acetaminophen  (TYLENOL ) 500 MG tablet Take 1,000 mg by mouth every 4 (four) hours as needed for moderate pain or mild pain.     apixaban  (ELIQUIS ) 5 MG TABS tablet Take 1 tablet (5 mg total) by mouth 2 (two) times daily. 2 tablet 0   ELIQUIS  5 MG TABS tablet TAKE 1 TABLET BY MOUTH TWICE A DAY 180 tablet 3   metoprolol  succinate (TOPROL -XL) 100 MG 24 hr tablet TAKE 1 TABLET EVERY DAY *TAKE WITH 50MG  TO EQUAL 150MG * 90 tablet 3   metoprolol  succinate (TOPROL -XL) 50 MG 24 hr tablet TAKE 1 TABLET BY MOUTH EVERY DAY WITH OR IMMEDIATELY FOLLOWING A MEAL 90 tablet 3   Multiple Vitamin (MULTIVITAMIN) tablet Take 1 tablet by mouth daily.      ondansetron  (ZOFRAN -ODT) 4 MG disintegrating tablet Take 1 tablet (4 mg total) by mouth every 8 (eight) hours as needed for nausea or vomiting. 10 tablet 1   zolpidem  (AMBIEN ) 10 MG tablet Take 1 tablet (10 mg total) by mouth at bedtime as needed for sleep. 90 tablet 1   No current facility-administered medications on file prior to visit.    BP 110/70   Pulse 92   Temp 97.7 F (36.5 C) (Oral)   Ht 5' 0.25 (1.53 m)   Wt  162 lb (73.5 kg)   SpO2 96%   BMI 31.38 kg/m       Objective:   Physical Exam Vitals and nursing note reviewed.  Constitutional:      Appearance: Normal appearance.  Cardiovascular:     Rate and Rhythm: Regular rhythm.     Pulses: Normal pulses.     Heart sounds: Normal heart sounds.  Musculoskeletal:     Lumbar back: Tenderness and bony tenderness present. No swelling, edema or deformity.       Back:     Comments: Tenderness with palpation to lower lumbar spine and paraspinal muscles.   Skin:    General: Skin is warm and dry.  Neurological:     General: No focal deficit present.     Mental Status: She is alert and oriented to person, place, and time.  Psychiatric:        Mood and Affect: Mood normal.        Behavior: Behavior normal.        Thought Content: Thought content normal.        Judgment: Judgment normal.        Assessment & Plan:  1. Acute midline low back pain without sciatica (Primary) - Will send in Medrol  Dosepak and Flexeril  and this is likely muscular but cannot r/o disc issue.  She is aware the Flexeril  may make her sleepy.  Will also do lumbar spine x-ray today.  She is unable to take NSAIDs due to being on Eliquis  - methylPREDNISolone  (MEDROL  DOSEPAK) 4 MG TBPK tablet; Take as directed  Dispense: 21 tablet; Refill: 0 - DG Lumbar Spine Complete; Future - cyclobenzaprine  (FLEXERIL ) 5 MG tablet; Take 1 tablet (5 mg total) by mouth 3 (three) times daily as needed for muscle  spasms.  Dispense: 30 tablet; Refill: 1  Anmarie Fukushima, NP

## 2023-10-10 ENCOUNTER — Ambulatory Visit: Payer: Self-pay | Admitting: Adult Health

## 2023-11-17 DIAGNOSIS — H00015 Hordeolum externum left lower eyelid: Secondary | ICD-10-CM | POA: Diagnosis not present

## 2023-11-17 DIAGNOSIS — H0102A Squamous blepharitis right eye, upper and lower eyelids: Secondary | ICD-10-CM | POA: Diagnosis not present

## 2023-11-17 DIAGNOSIS — H0102B Squamous blepharitis left eye, upper and lower eyelids: Secondary | ICD-10-CM | POA: Diagnosis not present

## 2023-12-22 ENCOUNTER — Ambulatory Visit

## 2023-12-22 ENCOUNTER — Other Ambulatory Visit (INDEPENDENT_AMBULATORY_CARE_PROVIDER_SITE_OTHER)

## 2023-12-22 DIAGNOSIS — Z111 Encounter for screening for respiratory tuberculosis: Secondary | ICD-10-CM | POA: Diagnosis not present

## 2023-12-26 ENCOUNTER — Telehealth: Payer: Self-pay | Admitting: Family Medicine

## 2023-12-26 ENCOUNTER — Ambulatory Visit: Payer: Self-pay | Admitting: Family Medicine

## 2023-12-26 LAB — QUANTIFERON-TB GOLD PLUS
Mitogen-NIL: 1.53 [IU]/mL
NIL: 0.01 [IU]/mL
QuantiFERON-TB Gold Plus: NEGATIVE
TB1-NIL: 0 [IU]/mL
TB2-NIL: 0 [IU]/mL

## 2023-12-26 NOTE — Telephone Encounter (Signed)
 Pt is calling checking on the status of the form per pt the form was needed TB blood test results are in

## 2024-01-17 ENCOUNTER — Ambulatory Visit (INDEPENDENT_AMBULATORY_CARE_PROVIDER_SITE_OTHER)

## 2024-01-17 DIAGNOSIS — Z23 Encounter for immunization: Secondary | ICD-10-CM | POA: Diagnosis not present

## 2024-01-29 ENCOUNTER — Other Ambulatory Visit: Payer: Self-pay | Admitting: Family Medicine

## 2024-02-03 DIAGNOSIS — M25562 Pain in left knee: Secondary | ICD-10-CM | POA: Diagnosis not present

## 2024-02-16 ENCOUNTER — Telehealth: Payer: Self-pay | Admitting: Physician Assistant

## 2024-02-16 DIAGNOSIS — I4821 Permanent atrial fibrillation: Secondary | ICD-10-CM

## 2024-02-16 MED ORDER — APIXABAN 5 MG PO TABS: 5.0000 mg | ORAL_TABLET | Freq: Two times a day (BID) | ORAL | 0 refills | Status: AC

## 2024-02-16 MED ORDER — METOPROLOL SUCCINATE ER 100 MG PO TB24: 100.0000 mg | ORAL_TABLET | Freq: Every day | ORAL | 0 refills | Status: AC

## 2024-02-16 MED ORDER — METOPROLOL SUCCINATE ER 50 MG PO TB24: 50.0000 mg | ORAL_TABLET | Freq: Every day | ORAL | 0 refills | Status: DC

## 2024-02-16 NOTE — Telephone Encounter (Signed)
 Pt scheduled to see Dr. Loni 03/07/24, refill sent in for Metoprolol  100 and 50.  Will forward for the Eliquis  refill to be done.

## 2024-02-16 NOTE — Telephone Encounter (Signed)
 Eliquis  5mg  refill request received. Patient is 78 years old, weight-73.5kg, Crea- on via , Diagnosis-Afib, and last seen by Dr. Alveta on 02/27/23 and pending appt with Dr. Loni. Dose is appropriate based on dosing criteria. Will send in limited refill to requested pharmacy and order labs. Placing a note on December appt with Cardiologist to obtain labs on that visit (CBC & BMET ordered and released).

## 2024-02-16 NOTE — Telephone Encounter (Signed)
 1. Which medications need to be refilled? (please list name of each medication and dose if known) metoprolol  succinate (TOPROL -XL) 50 MG 24 hr tablet     metoprolol  succinate (TOPROL -XL) 100 MG 24 hr tablet    apixaban  (ELIQUIS ) 5 MG TABS tablet     2. Would you like to learn more about the convenience, safety, & potential cost savings by using the [facility]? no     3. Are you open to using the [facility] (Type [facility]. no    4. Which pharmacy/location (including street and city if local pharmacy) is medication to be sent to? [id number] - [place], [place] - [address]     5. Do they need a 30 day or 90 day supply?   Pt has appt. 12\11 Please fill til then.

## 2024-03-05 ENCOUNTER — Encounter: Payer: Self-pay | Admitting: *Deleted

## 2024-03-05 DIAGNOSIS — L578 Other skin changes due to chronic exposure to nonionizing radiation: Secondary | ICD-10-CM | POA: Diagnosis not present

## 2024-03-05 DIAGNOSIS — L570 Actinic keratosis: Secondary | ICD-10-CM | POA: Diagnosis not present

## 2024-03-07 ENCOUNTER — Encounter: Payer: Self-pay | Admitting: Internal Medicine

## 2024-03-07 ENCOUNTER — Ambulatory Visit: Attending: Internal Medicine | Admitting: Internal Medicine

## 2024-03-07 VITALS — BP 128/79 | HR 87 | Ht 61.0 in | Wt 164.8 lb

## 2024-03-07 DIAGNOSIS — Z79899 Other long term (current) drug therapy: Secondary | ICD-10-CM | POA: Diagnosis not present

## 2024-03-07 DIAGNOSIS — I340 Nonrheumatic mitral (valve) insufficiency: Secondary | ICD-10-CM | POA: Diagnosis not present

## 2024-03-07 DIAGNOSIS — D6869 Other thrombophilia: Secondary | ICD-10-CM | POA: Diagnosis not present

## 2024-03-07 DIAGNOSIS — I428 Other cardiomyopathies: Secondary | ICD-10-CM

## 2024-03-07 DIAGNOSIS — I4821 Permanent atrial fibrillation: Secondary | ICD-10-CM

## 2024-03-07 MED ORDER — APIXABAN 5 MG PO TABS: 5.0000 mg | ORAL_TABLET | Freq: Two times a day (BID) | ORAL | 1 refills | Status: AC

## 2024-03-07 MED ORDER — METOPROLOL SUCCINATE ER 50 MG PO TB24: 50.0000 mg | ORAL_TABLET | Freq: Every day | ORAL | 1 refills | Status: AC

## 2024-03-07 NOTE — Patient Instructions (Signed)
 Medication Instructions:  NO Changes *If you need a refill on your cardiac medications before your next appointment, please call your pharmacy*  Lab Work: None  Testing/Procedures: Your physician has requested that you have an echocardiogram. Echocardiography is a painless test that uses sound waves to create images of your heart. It provides your doctor with information about the size and shape of your heart and how well your hearts chambers and valves are working. This procedure takes approximately one hour. There are no restrictions for this procedure. Please do NOT wear cologne, perfume, aftershave, or lotions (deodorant is allowed). Please arrive 15 minutes prior to your appointment time.  Please note: We ask at that you not bring children with you during ultrasound (echo/ vascular) testing. Due to room size and safety concerns, children are not allowed in the ultrasound rooms during exams. Our front office staff cannot provide observation of children in our lobby area while testing is being conducted. An adult accompanying a patient to their appointment will only be allowed in the ultrasound room at the discretion of the ultrasound technician under special circumstances. We apologize for any inconvenience.   Follow-Up: At [facility], you and your health needs are our priority.  As part of our continuing mission to provide you with exceptional heart care, our providers are all part of one team.  This team includes your primary Cardiologist (physician) and Advanced Practice Providers or APPs (Physician Assistants and Nurse Practitioners) who all work together to provide you with the care you need, when you need it.  Your next appointment:   1 year(s) (We will mail a reminder letter around Oct 2026; please call for a December 2026 appointment)  Provider:   Gayatri A Acharya, MD

## 2024-03-07 NOTE — Progress Notes (Signed)
 Cardiology Office Note:  .   Date:  03/07/2024  ID:  [name], DOB [birth year], MRN [id number] PCP: Micheal Wolm ORN, MD  [facility] Providers Cardiologist:  Soyla DELENA Merck, MD    History of Present Illness: .   [name] is a 78 y.o. female.  Discussed the use of AI scribe software for clinical note transcription with the patient, who gave verbal consent to proceed.  History of Present Illness [name] is a 78 year old female with permanent atrial fibrillation who presents for follow-up of her cardiac condition.  She has permanent atrial fibrillation treated with metoprolol  succinate 100 mg in the morning and 50 mg in the evening and apixaban  5 mg twice daily. She underwent cardioversion in the past but has remained in atrial fibrillation for several years. Her 2023 echocardiogram showed reduced ejection fraction, severe left atrial dilation, and moderate mitral regurgitation. She has no palpitations or fatigue. She monitors her heart rate at home, which rarely exceeds 90 bpm, and spaces metoprolol  doses to maintain rate control.  She has had significant weight loss in the past with some regain after a knee injury that limits activity. 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  She follows a high protein, low carbohydrate diet and had difficulty maintaining it over the holidays but plans to resume stricter adherence. She uses protein shakes and monitors macros and fiber intake.    ROS: negative except per HPI above.  Studies Reviewed: SABRA   EKG Interpretation Date/Time:  Thursday March 07 2024 11:07:22 EST Ventricular Rate:  87 PR Interval:    QRS Duration:  88 QT Interval:  360 QTC Calculation: 433 R Axis:   57  Text Interpretation: Atrial fibrillation Nonspecific T wave abnormality When compared with ECG of 27-Feb-2023 10:20, Nonspecific T wave  abnormality, worse in Inferior leads Confirmed by Merck Soyla ([id number]) on 03/07/2024 11:18:12 AM    Results DIAGNOSTIC Echocardiogram: Mildly reduced ejection fraction, severe left atrial dilation, moderate to severe mitral valve regurgitation (2023) Nuclear stress test: Normal (2023) Risk Assessment/Calculations:    CHA2DS2-VASc Score = 4   This indicates a 4.8% annual risk of stroke. The patient's score is based upon: CHF History: 1 HTN History: 0 Diabetes History: 0 Stroke History: 0 Vascular Disease History: 0 Age Score: 2 Gender Score: 1      Physical Exam:   VS:  BP 128/79 (BP Location: Left Arm, Patient Position: Sitting)   Pulse 87   Ht 5' 1 (1.549 m)   Wt 164 lb 12.8 oz (74.8 kg)   SpO2 97%   BMI 31.14 kg/m    Wt Readings from Last 3 Encounters:  03/07/24 164 lb 12.8 oz (74.8 kg)  10/05/23 162 lb (73.5 kg)  06/23/23 161 lb (73 kg)     Physical Exam VITALS: P- 89 GENERAL: Alert, cooperative, well developed, no acute distress. HEENT: Normocephalic, normal oropharynx, moist mucous membranes. CHEST: Clear to auscultation bilaterally. No wheezes, rhonchi, or crackles. CARDIOVASCULAR: Normal heart rate and rhythm. S1 and S2 normal. Moderate mitral valve regurgitation auscultated. ABDOMEN: Soft, non-tender, non-distended, without organomegaly. Normal bowel sounds. EXTREMITIES: No cyanosis or edema. NEUROLOGICAL: Cranial nerves grossly intact. Moves all extremities without gross motor or sensory deficit.   ASSESSMENT AND PLAN: .    Assessment and Plan Assessment & Plan Permanent atrial fibrillation Atrial fibrillation is permanent with previous cardioversion attempts. Asymptomatic with left atrial dilation.  Current management with metoprolol  and apixaban  is effective. - Continue metoprolol  succinate 100 mg in the morning and 50 mg in the evening. - Continue apixaban  5 mg BID. - refill eliquis  and metop succ 50 mg daily today for 1 year.   Nonrheumatic  mitral valve regurgitation Moderate to severe mitral valve regurgitation noted on 2023 echocardiogram. Asymptomatic. Regular monitoring necessary. - Ordered echocardiogram to assess mitral valve regurgitation. - Will discuss potential surgical or procedural interventions if regurgitation worsens.  Mildly reduced left ventricular ejection fraction Noted on 2023 echocardiogram. No current symptoms of heart failure. - Will consider coronary anatomy assessment if ejection fraction remains reduced. - repeat echo now.     I spent 40 minutes in the care of [name] today including reviewing labs (bmet cbc 9/24), reviewing studies (echo 10/23 and reviewed with patient in the room, nuc 11/2019), face to face time discussing treatment options (29 min), and documenting in the encounter.     Soyla Merck, MD, FACC

## 2024-03-25 ENCOUNTER — Telehealth: Payer: Self-pay | Admitting: Family Medicine

## 2024-03-25 NOTE — Telephone Encounter (Signed)
 Pt dropped off paperwork to be signed by Dr. Jud in folder

## 2024-04-16 ENCOUNTER — Ambulatory Visit (HOSPITAL_COMMUNITY)
Admission: RE | Admit: 2024-04-16 | Discharge: 2024-04-16 | Disposition: A | Discharge status: Home / Self Care | Source: Ambulatory Visit | Attending: Internal Medicine | Admitting: Internal Medicine

## 2024-04-16 DIAGNOSIS — I340 Nonrheumatic mitral (valve) insufficiency: Secondary | ICD-10-CM | POA: Insufficient documentation

## 2024-04-16 DIAGNOSIS — I428 Other cardiomyopathies: Secondary | ICD-10-CM | POA: Insufficient documentation

## 2024-04-16 DIAGNOSIS — I4821 Permanent atrial fibrillation: Secondary | ICD-10-CM | POA: Insufficient documentation

## 2024-04-16 DIAGNOSIS — D6869 Other thrombophilia: Secondary | ICD-10-CM | POA: Insufficient documentation

## 2024-04-17 ENCOUNTER — Ambulatory Visit: Payer: Self-pay | Admitting: Internal Medicine

## 2024-04-17 LAB — ECHOCARDIOGRAM COMPLETE
MV M vel: 4.92 m/s
MV Peak grad: 96.8 mmHg
Radius: 0.65 cm
S' Lateral: 3.3 cm

## 2024-04-23 ENCOUNTER — Encounter: Admitting: Obstetrics and Gynecology

## 2024-07-08 ENCOUNTER — Encounter: Admitting: Obstetrics and Gynecology
